# Patient Record
Sex: Female | Born: 1990 | Race: Black or African American | Hispanic: No | Marital: Single | Smoking: Current every day smoker
Health system: Southern US, Community
[De-identification: ages and names within clinical notes are randomized; demographics above are authoritative.]

## PROBLEM LIST (undated history)

## (undated) ENCOUNTER — Inpatient Hospital Stay: Payer: Self-pay

## (undated) ENCOUNTER — Inpatient Hospital Stay (HOSPITAL_COMMUNITY): Payer: Self-pay

## (undated) DIAGNOSIS — J45909 Unspecified asthma, uncomplicated: Secondary | ICD-10-CM

## (undated) DIAGNOSIS — F419 Anxiety disorder, unspecified: Secondary | ICD-10-CM

## (undated) DIAGNOSIS — F4310 Post-traumatic stress disorder, unspecified: Secondary | ICD-10-CM

## (undated) DIAGNOSIS — F329 Major depressive disorder, single episode, unspecified: Secondary | ICD-10-CM

## (undated) DIAGNOSIS — O99320 Drug use complicating pregnancy, unspecified trimester: Secondary | ICD-10-CM

## (undated) DIAGNOSIS — F32A Depression, unspecified: Secondary | ICD-10-CM

## (undated) DIAGNOSIS — N83209 Unspecified ovarian cyst, unspecified side: Secondary | ICD-10-CM

## (undated) HISTORY — PX: NO PAST SURGERIES: SHX2092

## (undated) HISTORY — PX: TUBAL LIGATION: SHX77

---
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
## 2008-02-16 ENCOUNTER — Inpatient Hospital Stay: Payer: Self-pay

## 2008-04-18 ENCOUNTER — Observation Stay: Payer: Self-pay

## 2008-05-08 ENCOUNTER — Inpatient Hospital Stay: Payer: Self-pay

## 2008-11-21 ENCOUNTER — Emergency Department: Payer: Self-pay | Admitting: Emergency Medicine

## 2009-09-19 ENCOUNTER — Emergency Department: Payer: Self-pay | Admitting: Emergency Medicine

## 2009-09-20 ENCOUNTER — Inpatient Hospital Stay: Payer: Self-pay | Admitting: Psychiatry

## 2009-11-01 ENCOUNTER — Emergency Department: Payer: Self-pay | Admitting: Unknown Physician Specialty

## 2010-08-25 ENCOUNTER — Emergency Department: Payer: Self-pay | Admitting: Emergency Medicine

## 2010-12-16 ENCOUNTER — Emergency Department: Payer: Self-pay | Admitting: Unknown Physician Specialty

## 2011-02-06 ENCOUNTER — Emergency Department: Payer: Self-pay | Admitting: Emergency Medicine

## 2011-02-26 ENCOUNTER — Observation Stay: Payer: Self-pay | Admitting: Obstetrics and Gynecology

## 2011-04-30 ENCOUNTER — Observation Stay: Payer: Self-pay | Admitting: Obstetrics and Gynecology

## 2011-05-01 ENCOUNTER — Ambulatory Visit: Payer: Self-pay | Admitting: Obstetrics and Gynecology

## 2011-05-06 ENCOUNTER — Encounter: Payer: Self-pay | Admitting: Obstetrics & Gynecology

## 2011-06-03 ENCOUNTER — Encounter: Payer: Self-pay | Admitting: Maternal & Fetal Medicine

## 2011-06-06 ENCOUNTER — Encounter: Payer: Self-pay | Admitting: Obstetrics & Gynecology

## 2011-06-10 ENCOUNTER — Encounter: Payer: Self-pay | Admitting: Maternal & Fetal Medicine

## 2011-06-13 ENCOUNTER — Encounter: Payer: Self-pay | Admitting: Obstetrics and Gynecology

## 2011-06-20 ENCOUNTER — Inpatient Hospital Stay: Payer: Self-pay | Admitting: Obstetrics and Gynecology

## 2011-06-20 ENCOUNTER — Encounter: Payer: Self-pay | Admitting: Maternal and Fetal Medicine

## 2011-06-20 LAB — CBC WITH DIFFERENTIAL/PLATELET
Basophil #: 0 10*3/uL (ref 0.0–0.1)
Basophil %: 0.2 %
Eosinophil %: 0.3 %
Lymphocyte #: 1.7 10*3/uL (ref 1.0–3.6)
Lymphocyte %: 14.4 %
MCHC: 34.2 g/dL (ref 32.0–36.0)
MCV: 96 fL (ref 80–100)
Monocyte #: 0.8 10*3/uL — ABNORMAL HIGH (ref 0.0–0.7)
Monocyte %: 6.8 %
Neutrophil #: 9.3 10*3/uL — ABNORMAL HIGH (ref 1.4–6.5)
Neutrophil %: 78.3 %
Platelet: 203 10*3/uL (ref 150–440)
RBC: 3.97 10*6/uL (ref 3.80–5.20)
RDW: 13 % (ref 11.5–14.5)
WBC: 11.8 10*3/uL — ABNORMAL HIGH (ref 3.6–11.0)

## 2011-06-23 LAB — BETA STREP CULTURE(ARMC)

## 2012-05-18 ENCOUNTER — Emergency Department: Payer: Self-pay | Admitting: Emergency Medicine

## 2012-10-10 ENCOUNTER — Emergency Department: Payer: Self-pay | Admitting: Emergency Medicine

## 2012-10-10 LAB — COMPREHENSIVE METABOLIC PANEL
Albumin: 3.5 g/dL (ref 3.4–5.0)
BUN: 12 mg/dL (ref 7–18)
Bilirubin,Total: 0.6 mg/dL (ref 0.2–1.0)
Calcium, Total: 8.8 mg/dL (ref 8.5–10.1)
Co2: 15 mmol/L — ABNORMAL LOW (ref 21–32)
EGFR (African American): >60
Potassium: 3.3 mmol/L — ABNORMAL LOW (ref 3.5–5.1)
SGOT(AST): 24 U/L (ref 15–37)
SGPT (ALT): 24 U/L (ref 12–78)
Sodium: 143 mmol/L (ref 136–145)

## 2012-10-10 LAB — CBC
HGB: 13.3 g/dL (ref 12.0–16.0)
MCH: 32.3 pg (ref 26.0–34.0)
MCV: 94 fL (ref 80–100)
Platelet: 256 10*3/uL (ref 150–440)
RDW: 12.9 % (ref 11.5–14.5)
WBC: 12.6 10*3/uL — ABNORMAL HIGH (ref 3.6–11.0)

## 2012-10-10 LAB — DRUG SCREEN, URINE
Amphetamines, Ur Screen: NEGATIVE (ref ?–1000)
Barbiturates, Ur Screen: NEGATIVE (ref ?–200)
Cocaine Metabolite,Ur ~~LOC~~: NEGATIVE (ref ?–300)
MDMA (Ecstasy)Ur Screen: NEGATIVE (ref ?–500)
Methadone, Ur Screen: NEGATIVE (ref ?–300)
Tricyclic, Ur Screen: NEGATIVE (ref ?–1000)

## 2012-10-10 LAB — ETHANOL: Ethanol %: 0.167 % — ABNORMAL HIGH (ref 0.000–0.080)

## 2013-08-19 ENCOUNTER — Emergency Department: Payer: Self-pay | Admitting: Emergency Medicine

## 2013-08-19 LAB — CBC WITH DIFFERENTIAL/PLATELET
Basophil #: 0 10*3/uL (ref 0.0–0.1)
Basophil %: 0.4 %
EOS ABS: 0.1 10*3/uL (ref 0.0–0.7)
Eosinophil %: 0.9 %
HCT: 41.2 % (ref 35.0–47.0)
HGB: 14 g/dL (ref 12.0–16.0)
Lymphocyte #: 2.8 10*3/uL (ref 1.0–3.6)
Lymphocyte %: 29.5 %
MCH: 32.5 pg (ref 26.0–34.0)
MCHC: 34 g/dL (ref 32.0–36.0)
MCV: 96 fL (ref 80–100)
MONO ABS: 0.9 x10 3/mm (ref 0.2–0.9)
MONOS PCT: 9.7 %
NEUTROS ABS: 5.6 10*3/uL (ref 1.4–6.5)
Neutrophil %: 59.5 %
Platelet: 331 10*3/uL (ref 150–440)
RBC: 4.3 10*6/uL (ref 3.80–5.20)
RDW: 13 % (ref 11.5–14.5)
WBC: 9.4 10*3/uL (ref 3.6–11.0)

## 2013-08-19 LAB — URINALYSIS, COMPLETE
BLOOD: NEGATIVE
Bilirubin,UR: NEGATIVE
Glucose,UR: NEGATIVE mg/dL (ref 0–75)
Ketone: NEGATIVE
Nitrite: NEGATIVE
PROTEIN: NEGATIVE
Ph: 6 (ref 4.5–8.0)
RBC,UR: 2 /HPF (ref 0–5)
SPECIFIC GRAVITY: 1.018 (ref 1.003–1.030)
WBC UR: 17 /HPF (ref 0–5)

## 2013-08-19 LAB — COMPREHENSIVE METABOLIC PANEL
AST: 16 U/L (ref 15–37)
Albumin: 3.5 g/dL (ref 3.4–5.0)
Alkaline Phosphatase: 85 U/L
Anion Gap: 3 — ABNORMAL LOW (ref 7–16)
BUN: 7 mg/dL (ref 7–18)
Bilirubin,Total: 1.1 mg/dL — ABNORMAL HIGH (ref 0.2–1.0)
CREATININE: 0.98 mg/dL (ref 0.60–1.30)
Calcium, Total: 9.1 mg/dL (ref 8.5–10.1)
Chloride: 107 mmol/L (ref 98–107)
Co2: 29 mmol/L (ref 21–32)
EGFR (African American): >60
EGFR (Non-African Amer.): >60
GLUCOSE: 69 mg/dL (ref 65–99)
Osmolality: 274 (ref 275–301)
Potassium: 3.7 mmol/L (ref 3.5–5.1)
SGPT (ALT): 21 U/L (ref 12–78)
SODIUM: 139 mmol/L (ref 136–145)
TOTAL PROTEIN: 7.4 g/dL (ref 6.4–8.2)

## 2013-08-19 LAB — WET PREP, GENITAL

## 2013-08-19 LAB — LIPASE, BLOOD: Lipase: 103 U/L (ref 73–393)

## 2013-08-19 LAB — GC/CHLAMYDIA PROBE AMP

## 2013-08-21 LAB — URINE CULTURE

## 2013-08-22 ENCOUNTER — Emergency Department: Payer: Self-pay | Admitting: Emergency Medicine

## 2014-05-27 NOTE — L&D Delivery Note (Signed)
Delivery Note At 2:18 AM a viable female was delivered via Vaginal Spontaneous Delivery IN OA pos, Cord around the neck x 1 reduced. Del of the vtx, ant and post shoulder and body and to mom's abd. Baby crying and moving all extrems, NICU NP and RN in room for delivery.   APGAR:9-9, ; weight: 1650 grms, 4#5oz.  .   Placenta status:delivery completed, FF and lochia.  .  Cord pH:not done  Anesthesia: none Episiotomy:  none Lacerations: none Suture Repair: none Est. Blood Loss (mL):    Mom stable. BP sl elevated. Pecola Leisure.  Baby to Couplet care / Skin to Skin.  Sharee Pimplearon W Jones 03/24/2015, 2:46 AM

## 2014-08-31 ENCOUNTER — Encounter (HOSPITAL_COMMUNITY): Payer: Self-pay | Admitting: *Deleted

## 2014-08-31 ENCOUNTER — Emergency Department (HOSPITAL_COMMUNITY)
Admission: EM | Admit: 2014-08-31 | Discharge: 2014-08-31 | Disposition: A | Discharge status: Home / Self Care | Payer: Medicaid Other | Attending: Emergency Medicine | Admitting: Emergency Medicine

## 2014-08-31 DIAGNOSIS — O99611 Diseases of the digestive system complicating pregnancy, first trimester: Secondary | ICD-10-CM | POA: Diagnosis not present

## 2014-08-31 DIAGNOSIS — K219 Gastro-esophageal reflux disease without esophagitis: Secondary | ICD-10-CM | POA: Insufficient documentation

## 2014-08-31 DIAGNOSIS — Z3A01 Less than 8 weeks gestation of pregnancy: Secondary | ICD-10-CM | POA: Insufficient documentation

## 2014-08-31 DIAGNOSIS — R110 Nausea: Secondary | ICD-10-CM

## 2014-08-31 DIAGNOSIS — F17210 Nicotine dependence, cigarettes, uncomplicated: Secondary | ICD-10-CM | POA: Insufficient documentation

## 2014-08-31 DIAGNOSIS — O2341 Unspecified infection of urinary tract in pregnancy, first trimester: Secondary | ICD-10-CM | POA: Diagnosis not present

## 2014-08-31 DIAGNOSIS — O99331 Smoking (tobacco) complicating pregnancy, first trimester: Secondary | ICD-10-CM | POA: Diagnosis not present

## 2014-08-31 DIAGNOSIS — O9989 Other specified diseases and conditions complicating pregnancy, childbirth and the puerperium: Secondary | ICD-10-CM | POA: Diagnosis present

## 2014-08-31 DIAGNOSIS — Z3490 Encounter for supervision of normal pregnancy, unspecified, unspecified trimester: Secondary | ICD-10-CM

## 2014-08-31 LAB — URINALYSIS, ROUTINE W REFLEX MICROSCOPIC
BILIRUBIN URINE: NEGATIVE
Glucose, UA: NEGATIVE mg/dL
Ketones, ur: NEGATIVE mg/dL
NITRITE: NEGATIVE
PROTEIN: NEGATIVE mg/dL
SPECIFIC GRAVITY, URINE: 1.014 (ref 1.005–1.030)
Urobilinogen, UA: 0.2 mg/dL (ref 0.0–1.0)
pH: 6 (ref 5.0–8.0)

## 2014-08-31 LAB — URINE MICROSCOPIC-ADD ON

## 2014-08-31 LAB — POC URINE PREG, ED: PREG TEST UR: POSITIVE — AB

## 2014-08-31 MED ORDER — CEPHALEXIN 500 MG PO CAPS: ORAL_CAPSULE | ORAL | Status: DC

## 2014-08-31 MED ORDER — DOXYLAMINE SUCCINATE (SLEEP) 25 MG PO TABS: 25.0000 mg | ORAL_TABLET | Freq: Every evening | ORAL | Status: DC | PRN

## 2014-08-31 MED ORDER — VITAMIN B-6 25 MG PO TABS: 25.0000 mg | ORAL_TABLET | Freq: Every evening | ORAL | Status: DC | PRN

## 2014-08-31 NOTE — Discharge Instructions (Signed)
Take vitamin B6 with unisom as directed as needed for nausea. Avoid spicy, fatty, or fried foods. Raise the head of your bed to help with your indigestion. Stay very well hydrated with plenty of water throughout the day. Take antibiotic until completed. Follow up with your OBGYN in 1 week for recheck of ongoing symptoms but return to ER for emergent changing or worsening of symptoms. Please seek immediate care if you develop the following: You develop back pain.  Your symptoms are no better, or worse in 3 days. There is severe back pain or lower abdominal pain.  You develop chills.  You have a fever.  There is nausea or vomiting.  There is continued burning or discomfort with urination.     Asymptomatic Bacteriuria Asymptomatic bacteriuria is the presence of a large number of bacteria in your urine without the usual symptoms of burning or frequent urination. The following conditions increase the risk of asymptomatic bacteriuria:  Diabetes mellitus.  Advanced age.  Pregnancy in the first trimester.  Kidney stones.  Kidney transplants.  Leaky kidney tube valve in young children (reflux). Treatment for this condition is not needed in most people and can lead to other problems such as too much yeast and growth of resistant bacteria. However, some people, such as pregnant women, do need treatment to prevent kidney infection. Asymptomatic bacteriuria in pregnancy is also associated with fetal growth restriction, premature labor, and newborn death. HOME CARE INSTRUCTIONS Monitor your condition for any changes. The following actions may help to relieve any discomfort you are feeling:  Drink enough water and fluids to keep your urine clear or pale yellow. Go to the bathroom more often to keep your bladder empty.  Keep the area around your vagina and rectum clean. Wipe yourself from front to back after urinating. SEEK IMMEDIATE MEDICAL CARE IF:  You develop signs of an infection such  as:  Burning with urination.  Frequency of voiding.  Back pain.  Fever.  You have blood in the urine.  You develop a fever. MAKE SURE YOU:  Understand these instructions.  Will watch your condition.  Will get help right away if you are not doing well or get worse. Document Released: 05/13/2005 Document Revised: 09/27/2013 Document Reviewed: 11/02/2012 [facility] Patient Information 2015 [place], [place]. This information is not intended to replace advice given to you by your health care provider. Make sure you discuss any questions you have with your health care provider.  First Trimester of Pregnancy The first trimester of pregnancy is from week 1 until the end of week 12 (months 1 through 3). During this time, your baby will begin to develop inside you. At 6-8 weeks, the eyes and face are formed, and the heartbeat can be seen on ultrasound. At the end of 12 weeks, all the baby's organs are formed. Prenatal care is all the medical care you receive before the birth of your baby. Make sure you get good prenatal care and follow all of your doctor's instructions. HOME CARE  Medicines  Take medicine only as told by your doctor. Some medicines are safe and some are not during pregnancy.  Take your prenatal vitamins as told by your doctor.  Take medicine that helps you poop (stool softener) as needed if your doctor says it is okay. Diet  Eat regular, healthy meals.  Your doctor will tell you the amount of weight gain that is right for you.  Avoid raw meat and uncooked cheese.  If you feel sick to your  stomach (nauseous) or throw up (vomit):  Eat 4 or 5 small meals a day instead of 3 large meals.  Try eating a few soda crackers.  Drink liquids between meals instead of during meals.  If you have a hard time pooping (constipation):  Eat high-fiber foods like fresh vegetables, fruit, and whole grains.  Drink enough fluids to keep your pee (urine) clear or pale  yellow. Activity and Exercise  Exercise only as told by your doctor. Stop exercising if you have cramps or pain in your lower belly (abdomen) or low back.  Try to avoid standing for long periods of time. Move your legs often if you must stand in one place for a long time.  Avoid heavy lifting.  Wear low-heeled shoes. Sit and stand up straight.  You can have sex unless your doctor tells you not to. Relief of Pain or Discomfort  Wear a good support bra if your breasts are sore.  Take warm water baths (sitz baths) to soothe pain or discomfort caused by hemorrhoids. Use hemorrhoid cream if your doctor says it is okay.  Rest with your legs raised if you have leg cramps or low back pain.  Wear support hose if you have puffy, bulging veins (varicose veins) in your legs. Raise (elevate) your feet for 15 minutes, 3-4 times a day. Limit salt in your diet. Prenatal Care  Schedule your prenatal visits by the twelfth week of pregnancy.  Write down your questions. Take them to your prenatal visits.  Keep all your prenatal visits as told by your doctor. Safety  Wear your seat belt at all times when driving.  Make a list of emergency phone numbers. The list should include numbers for family, friends, the hospital, and police and fire departments. General Tips  Ask your doctor for a referral to a local prenatal class. Begin classes no later than at the start of month 6 of your pregnancy.  Ask for help if you need counseling or help with nutrition. Your doctor can give you advice or tell you where to go for help.  Do not use hot tubs, steam rooms, or saunas.  Do not douche or use tampons or scented sanitary pads.  Do not cross your legs for long periods of time.  Avoid litter boxes and soil used by cats.  Avoid all smoking, herbs, and alcohol. Avoid drugs not approved by your doctor.  Visit your dentist. At home, brush your teeth with a soft toothbrush. Be gentle when you floss. GET  HELP IF:  You are dizzy.  You have mild cramps or pressure in your lower belly.  You have a nagging pain in your belly area.  You continue to feel sick to your stomach, throw up, or have watery poop (diarrhea).  You have a bad smelling fluid coming from your vagina.  You have pain with peeing (urination).  You have increased puffiness (swelling) in your face, hands, legs, or ankles. GET HELP RIGHT AWAY IF:   You have a fever.  You are leaking fluid from your vagina.  You have spotting or bleeding from your vagina.  You have very bad belly cramping or pain.  You gain or lose weight rapidly.  You throw up blood. It may look like coffee grounds.  You are around people who have [place] measles, fifth disease, or chickenpox.  You have a very bad headache.  You have shortness of breath.  You have any kind of trauma, such as from a fall or  a car accident. Document Released: 10/30/2007 Document Revised: 09/27/2013 Document Reviewed: 03/23/2013 [facility] Patient Information 2015 [place], [place]. This information is not intended to replace advice given to you by your health care provider. Make sure you discuss any questions you have with your health care provider.  Nausea, Adult Nausea is the feeling that you have an upset stomach or have to vomit. Nausea by itself is not likely a serious concern, but it may be an early sign of more serious medical problems. As nausea gets worse, it can lead to vomiting. If vomiting develops, there is the risk of dehydration.  CAUSES   Viral infections.  Food poisoning.  Medicines.  Pregnancy.  Motion sickness.  Migraine headaches.  Emotional distress.  Severe pain from any source.  Alcohol intoxication. HOME CARE INSTRUCTIONS  Get plenty of rest.  Ask your caregiver about specific rehydration instructions.  Eat small amounts of food and sip liquids more often.  Take all medicines as told by your caregiver. SEEK MEDICAL CARE  IF:  You have not improved after 2 days, or you get worse.  You have a headache. SEEK IMMEDIATE MEDICAL CARE IF:   You have a fever.  You faint.  You keep vomiting or have blood in your vomit.  You are extremely weak or dehydrated.  You have dark or bloody stools.  You have severe chest or abdominal pain. MAKE SURE YOU:  Understand these instructions.  Will watch your condition.  Will get help right away if you are not doing well or get worse. Document Released: 06/20/2004 Document Revised: 02/05/2012 Document Reviewed: 01/23/2011 [facility] Patient Information 2015 [place], [place]. This information is not intended to replace advice given to you by your health care provider. Make sure you discuss any questions you have with your health care provider.  Food Choices for Gastroesophageal Reflux Disease When you have gastroesophageal reflux disease (GERD), the foods you eat and your eating habits are very important. Choosing the right foods can help ease your discomfort.  WHAT GUIDELINES DO I NEED TO FOLLOW?   Choose fruits, vegetables, whole grains, and low-fat dairy products.   Choose low-fat meat, fish, and poultry.  Limit fats such as oils, salad dressings, butter, nuts, and avocado.   Keep a food diary. This helps you identify foods that cause symptoms.   Avoid foods that cause symptoms. These may be different for everyone.   Eat small meals often instead of 3 large meals a day.   Eat your meals slowly, in a place where you are relaxed.   Limit fried foods.   Cook foods using methods other than frying.   Avoid drinking alcohol.   Avoid drinking large amounts of liquids with your meals.   Avoid bending over or lying down until 2-3 hours after eating.  WHAT FOODS ARE NOT RECOMMENDED?  These are some foods and drinks that may make your symptoms worse: Vegetables Tomatoes. Tomato juice. Tomato and spaghetti sauce. Chili peppers. Onion and garlic.  Horseradish. Fruits Oranges, grapefruit, and lemon (fruit and juice). Meats High-fat meats, fish, and poultry. This includes hot dogs, ribs, ham, sausage, salami, and bacon. Dairy Whole milk and chocolate milk. Sour cream. Cream. Butter. Ice cream. Cream cheese.  Drinks Coffee and tea. Bubbly (carbonated) drinks or energy drinks. Condiments Hot sauce. Barbecue sauce.  Sweets/Desserts Chocolate and cocoa. Donuts. Peppermint and spearmint. Fats and Oils High-fat foods. This includes [place] fries and potato chips. Other Vinegar. Strong spices. This includes black pepper, white pepper, red pepper, cayenne,  curry powder, cloves, ginger, and chili powder. The items listed above may not be a complete list of foods and drinks to avoid. Contact your dietitian for more information. Document Released: 11/12/2011 Document Revised: 05/18/2013 Document Reviewed: 03/17/2013 [facility] Patient Information 2015 [place], [place]. This information is not intended to replace advice given to you by your health care provider. Make sure you discuss any questions you have with your health care provider.

## 2014-08-31 NOTE — ED Notes (Signed)
Pt comfortable with discharge and follow up instructions. Pt declines wheelchair, escorted to waiting area by this RN. Prescriptions x3. 

## 2014-08-31 NOTE — ED Notes (Addendum)
Pt reports feeling nauseated and like "something is in her throat" for several days, denies vomiting. lmp was feb, reports + preg test two days ago.

## 2014-08-31 NOTE — ED Notes (Signed)
PA at bedside.

## 2014-08-31 NOTE — ED Provider Notes (Signed)
CSN: [id number]     Arrival date & time 08/31/14  0906 History   First MD Initiated Contact with Patient 08/31/14 0913     Chief Complaint  Patient presents with  . Nausea     (Consider location/radiation/quality/duration/timing/severity/associated sxs/prior Treatment) HPI Comments: ADESSA PRIMIANO is a 24 y.o. G3P2 female who presents to the ED with complaints of nausea, heartburn, and globus sensation 1 week. Patient reports that she took a home pregnancy test 2 days ago which was positive, but states that it was from [employer] store and she is unsure if it would be accurate. She reports her symptoms have no aggravating factors, and have been unrelieved with Pepto-Bismol. She denies any fevers, chills, chest pain, shortness breath, abdominal pain, vomiting, diarrhea, constipation, obstipation, vaginal bleeding or discharge, dysuria, hematuria, numbness, tingling, weakness, rashes, recent travel, sick contacts, suspicious food intake, alcohol use, or NSAIDs. LMP 07/17/14. She has had uncomplicated pregnancies for her previous 2 children.  Patient is a 24 y.o. female presenting with pregnancy problem. The history is provided by the patient. No language interpreter was used.  Possible Pregnancy This is a new problem. The current episode started today. The problem occurs rarely. The problem has been unchanged. Associated symptoms include nausea. Pertinent negatives include no abdominal pain, arthralgias, chest pain, chills, fever, myalgias, numbness, rash, vomiting or weakness. Nothing aggravates the symptoms. Treatments tried: pepto bismol. The treatment provided no relief.    History reviewed. No pertinent past medical history. History reviewed. No pertinent past surgical history. History reviewed. No pertinent family history. History  Substance Use Topics  . Smoking status: Current Every Day Smoker    Types: Cigarettes  . Smokeless tobacco: Not on file  . Alcohol Use: No   OB History     No data available     Review of Systems  Constitutional: Negative for fever and chills.  Respiratory: Negative for shortness of breath.   Cardiovascular: Negative for chest pain.  Gastrointestinal: Positive for nausea. Negative for vomiting, abdominal pain, diarrhea and constipation.  Genitourinary: Negative for dysuria, hematuria, vaginal bleeding, vaginal discharge and pelvic pain.  Musculoskeletal: Negative for myalgias and arthralgias.  Skin: Negative for rash.  Neurological: Negative for weakness and numbness.  Psychiatric/Behavioral: Negative for confusion.   10 Systems reviewed and are negative for acute change except as noted in the HPI.    Allergies  Shellfish allergy  Home Medications   Prior to Admission medications   Not on File   BP 106/50 mmHg  Pulse 101  Temp(Src) 97.8 F (36.6 C) (Oral)  Resp 18  SpO2 99%  LMP 07/17/2014 Physical Exam  Constitutional: She is oriented to person, place, and time. Vital signs are normal. She appears well-developed and well-nourished.  Non-toxic appearance. No distress.  Afebrile, nontoxic, NAD  HENT:  Head: Normocephalic and atraumatic.  Mouth/Throat: Oropharynx is clear and moist and mucous membranes are normal.  Eyes: Conjunctivae and EOM are normal. Right eye exhibits no discharge. Left eye exhibits no discharge.  Neck: Normal range of motion. Neck supple.  Cardiovascular: Normal rate, regular rhythm, normal heart sounds and intact distal pulses.  Exam reveals no gallop and no friction rub.   No murmur heard. Triage vitals showing HR 101, during exam HR 80-90s  Pulmonary/Chest: Effort normal and breath sounds normal. No respiratory distress. She has no decreased breath sounds. She has no wheezes. She has no rhonchi. She has no rales.  Abdominal: Soft. Normal appearance and bowel sounds are normal. She exhibits  no distension. There is no tenderness. There is no rigidity, no rebound, no guarding, no CVA tenderness, no  tenderness at McBurney's point and negative Murphy's sign.  Soft, NTND, +BS throughout, no r/g/r, neg murphy's, neg mcburney's, no CVA TTP   Musculoskeletal: Normal range of motion.  Neurological: She is alert and oriented to person, place, and time. She has normal strength. No sensory deficit.  Skin: Skin is warm, dry and intact. No rash noted.  Psychiatric: She has a normal mood and affect.  Nursing note and vitals reviewed.   ED Course  Procedures (including critical care time) Labs Review Labs Reviewed  URINALYSIS, ROUTINE W REFLEX MICROSCOPIC - Abnormal; Notable for the following:    APPearance TURBID (*)    Hgb urine dipstick TRACE (*)    Leukocytes, UA LARGE (*)    All other components within normal limits  URINE MICROSCOPIC-ADD ON - Abnormal; Notable for the following:    Squamous Epithelial / LPF FEW (*)    All other components within normal limits  POC URINE PREG, ED - Abnormal; Notable for the following:    Preg Test, Ur POSITIVE (*)    All other components within normal limits  URINE CULTURE    Imaging Review No results found.   EKG Interpretation None      MDM   Final diagnoses:  Nausea  Pregnancy  UTI (urinary tract infection) during pregnancy, first trimester  Gastroesophageal reflux disease, esophagitis presence not specified    24 y.o. female here with nausea and globus sensation in throat, and heartburn. Upreg + at home 2 days ago. No abdominal pain or tenderness, no vaginal complaints. History c/w nausea of early pregnancy and GERD. Pt would like to verify that she's pregnant, will get upreg. Will also check U/A for signs of infection in the event that she is pregnant. Discussed unisom+B6 combo for nausea, and avoidance of spicy/acidic foods as well as raising head of bed.   11:38 AM U/A with some signs of infection, given her pregnancy (Upreg positive here), will treat, will send for culture. Will give keflex for this. Will have her f/up with OBGYN.  I explained the diagnosis and have given explicit precautions to return to the ER including for any other new or worsening symptoms. The patient understands and accepts the medical plan as it's been dictated and I have answered their questions. Discharge instructions concerning home care and prescriptions have been given. The patient is STABLE and is discharged to home in good condition.   BP 118/61 mmHg  Pulse 53  Temp(Src) 97.8 F (36.6 C) (Oral)  Resp 18  Ht 5\' 4"  (1.626 m)  Wt 117 lb (53.071 kg)  BMI 20.07 kg/m2  SpO2 100%  LMP 07/17/2014  Meds ordered this encounter  Medications  . vitamin B-6 (PYRIDOXINE) 25 MG tablet    Sig: Take 1 tablet (25 mg total) by mouth at bedtime as needed (nausea).    Dispense:  30 tablet    Refill:  0    Order Specific Question:  Supervising Provider    Answer:  MILLER, BRIAN [[id number]]  . doxylamine, Sleep, (UNISOM) 25 MG tablet    Sig: Take 1 tablet (25 mg total) by mouth at bedtime as needed (nausea).    Dispense:  30 tablet    Refill:  0    Order Specific Question:  Supervising Provider    Answer:  MILLER, BRIAN [[id number]]  . cephALEXin (KEFLEX) 500 MG capsule    Sig: 2 caps  po bid x 7 days    Dispense:  28 capsule    Refill:  0    Order Specific Question:  Supervising Provider    Answer:  Eber Hong [[id number]]     Calysta Craigo Camprubi-Soms, PA-C 08/31/14 1139  Doug Sou, MD 08/31/14 1725

## 2014-09-01 LAB — URINE CULTURE: SPECIAL REQUESTS: NORMAL

## 2014-10-18 ENCOUNTER — Other Ambulatory Visit: Payer: Self-pay | Admitting: Family Medicine

## 2014-10-18 DIAGNOSIS — O0992 Supervision of high risk pregnancy, unspecified, second trimester: Secondary | ICD-10-CM

## 2014-10-20 ENCOUNTER — Other Ambulatory Visit: Payer: Self-pay | Admitting: Family Medicine

## 2014-10-20 ENCOUNTER — Ambulatory Visit
Admission: RE | Admit: 2014-10-20 | Discharge: 2014-10-20 | Disposition: A | Discharge status: Home / Self Care | Payer: Medicaid Other | Source: Ambulatory Visit | Attending: Family Medicine | Admitting: Family Medicine

## 2014-10-20 ENCOUNTER — Ambulatory Visit: Admission: RE | Admit: 2014-10-20 | Payer: Medicaid Other | Source: Ambulatory Visit

## 2014-10-20 DIAGNOSIS — Z36 Encounter for antenatal screening of mother: Secondary | ICD-10-CM | POA: Diagnosis not present

## 2014-10-20 DIAGNOSIS — O26841 Uterine size-date discrepancy, first trimester: Secondary | ICD-10-CM

## 2014-10-20 DIAGNOSIS — Z3A12 12 weeks gestation of pregnancy: Secondary | ICD-10-CM | POA: Diagnosis not present

## 2014-10-20 DIAGNOSIS — O26849 Uterine size-date discrepancy, unspecified trimester: Secondary | ICD-10-CM

## 2014-10-20 DIAGNOSIS — Z3491 Encounter for supervision of normal pregnancy, unspecified, first trimester: Secondary | ICD-10-CM

## 2014-10-20 DIAGNOSIS — O0992 Supervision of high risk pregnancy, unspecified, second trimester: Secondary | ICD-10-CM

## 2014-11-16 ENCOUNTER — Emergency Department
Admission: EM | Admit: 2014-11-16 | Discharge: 2014-11-18 | Disposition: A | Discharge status: Home / Self Care | Payer: Medicaid Other | Attending: Emergency Medicine | Admitting: Emergency Medicine

## 2014-11-16 ENCOUNTER — Encounter: Payer: Self-pay | Admitting: Emergency Medicine

## 2014-11-16 DIAGNOSIS — Z3490 Encounter for supervision of normal pregnancy, unspecified, unspecified trimester: Secondary | ICD-10-CM

## 2014-11-16 DIAGNOSIS — F17210 Nicotine dependence, cigarettes, uncomplicated: Secondary | ICD-10-CM | POA: Insufficient documentation

## 2014-11-16 DIAGNOSIS — F32A Depression, unspecified: Secondary | ICD-10-CM

## 2014-11-16 DIAGNOSIS — N390 Urinary tract infection, site not specified: Secondary | ICD-10-CM

## 2014-11-16 DIAGNOSIS — O99332 Smoking (tobacco) complicating pregnancy, second trimester: Secondary | ICD-10-CM | POA: Diagnosis not present

## 2014-11-16 DIAGNOSIS — F1210 Cannabis abuse, uncomplicated: Secondary | ICD-10-CM | POA: Diagnosis not present

## 2014-11-16 DIAGNOSIS — O99322 Drug use complicating pregnancy, second trimester: Secondary | ICD-10-CM | POA: Diagnosis not present

## 2014-11-16 DIAGNOSIS — F329 Major depressive disorder, single episode, unspecified: Secondary | ICD-10-CM | POA: Insufficient documentation

## 2014-11-16 DIAGNOSIS — Z3A16 16 weeks gestation of pregnancy: Secondary | ICD-10-CM | POA: Insufficient documentation

## 2014-11-16 DIAGNOSIS — Z79899 Other long term (current) drug therapy: Secondary | ICD-10-CM | POA: Diagnosis not present

## 2014-11-16 DIAGNOSIS — O99342 Other mental disorders complicating pregnancy, second trimester: Secondary | ICD-10-CM | POA: Diagnosis not present

## 2014-11-16 DIAGNOSIS — F4323 Adjustment disorder with mixed anxiety and depressed mood: Secondary | ICD-10-CM

## 2014-11-16 HISTORY — DX: Unspecified asthma, uncomplicated: J45.909

## 2014-11-16 LAB — ACETAMINOPHEN LEVEL

## 2014-11-16 LAB — COMPREHENSIVE METABOLIC PANEL
ALT: 13 U/L — ABNORMAL LOW (ref 14–54)
ANION GAP: 8 (ref 5–15)
AST: 18 U/L (ref 15–41)
Albumin: 3.6 g/dL (ref 3.5–5.0)
Alkaline Phosphatase: 73 U/L (ref 38–126)
BUN: 10 mg/dL (ref 6–20)
CALCIUM: 9.2 mg/dL (ref 8.9–10.3)
CO2: 23 mmol/L (ref 22–32)
CREATININE: 0.69 mg/dL (ref 0.44–1.00)
Chloride: 104 mmol/L (ref 101–111)
GFR calc Af Amer: >60 mL/min (ref >60)
GFR calc non Af Amer: >60 mL/min (ref >60)
Glucose, Bld: 90 mg/dL (ref 65–99)
Potassium: 3.7 mmol/L (ref 3.5–5.1)
Sodium: 135 mmol/L (ref 135–145)
TOTAL PROTEIN: 7.7 g/dL (ref 6.5–8.1)
Total Bilirubin: 0.7 mg/dL (ref 0.3–1.2)

## 2014-11-16 LAB — ETHANOL: Alcohol, Ethyl (B): <5 mg/dL (ref <5)

## 2014-11-16 LAB — URINE DRUG SCREEN, QUALITATIVE (ARMC ONLY)
Amphetamines, Ur Screen: NOT DETECTED
Barbiturates, Ur Screen: NOT DETECTED
Benzodiazepine, Ur Scrn: NOT DETECTED
CANNABINOID 50 NG, UR ~~LOC~~: POSITIVE — AB
COCAINE METABOLITE, UR ~~LOC~~: NOT DETECTED
MDMA (Ecstasy)Ur Screen: NOT DETECTED
Methadone Scn, Ur: NOT DETECTED
Opiate, Ur Screen: NOT DETECTED
Phencyclidine (PCP) Ur S: NOT DETECTED
TRICYCLIC, UR SCREEN: NOT DETECTED

## 2014-11-16 LAB — URINALYSIS COMPLETE WITH MICROSCOPIC (ARMC ONLY)
Bilirubin Urine: NEGATIVE
Glucose, UA: NEGATIVE mg/dL
Nitrite: NEGATIVE
Protein, ur: 30 mg/dL — AB
SPECIFIC GRAVITY, URINE: 1.03 (ref 1.005–1.030)
pH: 5 (ref 5.0–8.0)

## 2014-11-16 LAB — CBC
HEMATOCRIT: 41.6 % (ref 35.0–47.0)
HEMOGLOBIN: 13.8 g/dL (ref 12.0–16.0)
MCH: 32.1 pg (ref 26.0–34.0)
MCHC: 33.1 g/dL (ref 32.0–36.0)
MCV: 96.8 fL (ref 80.0–100.0)
Platelets: 263 10*3/uL (ref 150–440)
RBC: 4.3 MIL/uL (ref 3.80–5.20)
RDW: 13 % (ref 11.5–14.5)
WBC: 18.7 10*3/uL — AB (ref 3.6–11.0)

## 2014-11-16 LAB — PREGNANCY, URINE: Preg Test, Ur: POSITIVE — AB

## 2014-11-16 LAB — SALICYLATE LEVEL: Salicylate Lvl: <4 mg/dL (ref 2.8–30.0)

## 2014-11-16 MED ORDER — METOCLOPRAMIDE HCL 10 MG PO TABS
ORAL_TABLET | ORAL | Status: AC
  Administered 2014-11-16: 10 mg via ORAL
  Filled 2014-11-16: qty 1

## 2014-11-16 MED ORDER — CEPHALEXIN 500 MG PO CAPS
ORAL_CAPSULE | ORAL | Status: AC
  Administered 2014-11-16: 500 mg via ORAL
  Filled 2014-11-16: qty 1

## 2014-11-16 MED ORDER — PRENATAL MULTIVITAMIN CH
1.0000 | ORAL_TABLET | Freq: Every day | ORAL | Status: DC
  Administered 2014-11-16 – 2014-11-17 (×2): 1 via ORAL
  Filled 2014-11-16 (×3): qty 1

## 2014-11-16 MED ORDER — METOCLOPRAMIDE HCL 10 MG PO TABS
10.0000 mg | ORAL_TABLET | Freq: Once | ORAL | Status: AC
  Administered 2014-11-16: 10 mg via ORAL

## 2014-11-16 MED ORDER — METRONIDAZOLE 500 MG PO TABS
ORAL_TABLET | ORAL | Status: AC
  Administered 2014-11-16: 500 mg via ORAL
  Filled 2014-11-16: qty 1

## 2014-11-16 MED ORDER — CEPHALEXIN 500 MG PO CAPS
500.0000 mg | ORAL_CAPSULE | Freq: Three times a day (TID) | ORAL | Status: DC
  Administered 2014-11-16 – 2014-11-18 (×6): 500 mg via ORAL
  Filled 2014-11-16: qty 1

## 2014-11-16 MED ORDER — METRONIDAZOLE 500 MG PO TABS
500.0000 mg | ORAL_TABLET | Freq: Two times a day (BID) | ORAL | Status: DC
  Administered 2014-11-16 – 2014-11-18 (×4): 500 mg via ORAL
  Filled 2014-11-16 (×4): qty 1

## 2014-11-16 NOTE — ED Notes (Signed)
Pt report received from Megan RN. Pt care assumed. Pt asleep with no needs or concerns at this time.  

## 2014-11-16 NOTE — ED Notes (Signed)

## 2014-11-16 NOTE — ED Notes (Signed)
BEHAVIORAL HEALTH ROUNDING Patient sleeping: No. Patient alert and oriented: yes Behavior appropriate: Yes.  ; If no, describe:  Nutrition and fluids offered: Yes Toileting and hygiene offered: Yes  Sitter present: yes Law enforcement present: Yes ODS  

## 2014-11-16 NOTE — ED Notes (Signed)
BEHAVIORAL HEALTH ROUNDING Patient sleeping: No. Patient alert and oriented: yes Behavior appropriate: Yes.  ; If no, describe:  Nutrition and fluids offered: Yes  Toileting and hygiene offered: Yes  Sitter present: yes Law enforcement present: Yes  

## 2014-11-16 NOTE — BH Assessment (Addendum)
Assessment Note  Casey Owens is an 24 y.o. female. Patient presented to the ED via [facility] after reports of wanting to harm herself.  Patient reports she is [redacted] weeks pregnant and just lost her job and housing.  Patient reports she has no support system other than her mother, however, they do not get along.  Patient reports she has two other children and receives no assistance from any of the fathers.  Patient is tearful but denies plans to hurt herself.  Patient reports a history of cutting.  Axis I: Depressive Disorder NOS  Axis III:  Pregnancy Axis IV: Lacks housing, financial, employment  Past Medical History:  Past Medical History  Diagnosis Date  . Asthma     History reviewed. No pertinent past surgical history.  Family History: No family history on file.  Social History:  reports that she has been smoking Cigarettes.  She does not have any smokeless tobacco history on file. She reports that she does not drink alcohol or use illicit drugs.  Additional Social History:     CIWA: CIWA-Ar BP: 109/64 mmHg Pulse Rate: (!) 57 COWS:    Allergies:  Allergies  Allergen Reactions  . Shellfish Allergy     Home Medications:  (Not in a hospital admission)  OB/GYN Status:  Patient's last menstrual period was 07/17/2014.  General Assessment Data [facility] of [facility] TTS Assessment: In system Is this a Tele or Face-to-Face Assessment?: Face-to-Face Is this an Initial Assessment or a Re-assessment for this encounter?: Initial Assessment Marital status: Single Is patient pregnant?: Yes Pregnancy Status: Yes (Comment: include estimated delivery date) Living Arrangements: Other (Comment) (Patient reports she lost her housing.) Can pt return to current living arrangement?: No Admission Status: Voluntary Is patient capable of signing voluntary admission?: Yes Referral Source: Other (Health Department) Insurance type: Medicaid  Medical Screening Exam [facility]  Walk-in ONLY) Medical Exam completed: Yes  Crisis Care Plan Living Arrangements: Other (Comment) (Patient reports she lost her housing.)  Education Status Is patient currently in school?: Yes Current Grade: college Highest grade of school patient has completed: 12th  Risk to self with the past 6 months Suicidal Ideation: Yes-Currently Present Has patient been a risk to self within the past 6 months prior to admission? : Yes Suicidal Intent: No Has patient had any suicidal intent within the past 6 months prior to admission? : No Is patient at risk for suicide?: No Suicidal Plan?: No Has patient had any suicidal plan within the past 6 months prior to admission? : No Access to Means: No What has been your use of drugs/alcohol within the last 12 months?: None reported Previous Attempts/Gestures: No Other Self Harm Risks: Hx of cutting Triggers for Past Attempts: Other (Comment) (Conflict with others) Intentional Self Injurious Behavior: Cutting Comment - Self Injurious Behavior: Hx of cutting arm Family Suicide History: No Recent stressful life event(s): Job Loss, Financial Problems, Conflict (Comment), Other (Comment) (Lost housing) Persecutory voices/beliefs?: No Depression: Yes Depression Symptoms: Despondent, Tearfulness, Feeling worthless/self pity, Fatigue (Hopeless) Substance abuse history and/or treatment for substance abuse?: No Suicide prevention information given to non-admitted patients: Not applicable  Risk to Others within the past 6 months Homicidal Ideation: No Does patient have any lifetime risk of violence toward others beyond the six months prior to admission? : No Thoughts of Harm to Others: No Current Homicidal Intent: No Current Homicidal Plan: No Access to Homicidal Means: No Identified Victim: N/A History of harm to others?: No Assessment of Violence:  None Noted Violent Behavior Description: N/A Does patient have access to weapons?: No Criminal  Charges Pending?: No Does patient have a court date: No Is patient on probation?: No  Psychosis Hallucinations: None noted Delusions: None noted  Mental Status Report Appearance/Hygiene: In hospital gown Eye Contact: Good Motor Activity: Unable to assess Speech: Soft Level of Consciousness: Alert Mood: Depressed, Sad, Helpless, Despair, Anxious Affect: Anxious, Appropriate to circumstance, Depressed, Sad Anxiety Level: Moderate Thought Processes: Flight of Ideas Judgement: Partial Orientation: Person, Place, Time, Situation Obsessive Compulsive Thoughts/Behaviors: Minimal  Cognitive Functioning Concentration: Normal Memory: Recent Intact IQ: Average Insight: Fair (Does not consider consequences for poor choices) Impulse Control: Good Appetite: Fair  ADLScreening [facility]) Patient's cognitive ability adequate to safely complete daily activities?: Yes Patient able to express need for assistance with ADLs?: Yes Independently performs ADLs?: Yes (appropriate for developmental age)  Prior Inpatient Therapy Prior Inpatient Therapy: No Reason for Treatment: Symptoms of depression  Prior Outpatient Therapy Prior Outpatient Therapy: No Does patient have an ACCT team?: No Does patient have Intensive In-House Services?  : No Does patient have [facility] services? : No Does patient have P4CC services?: No  ADL Screening (condition at time of admission) Patient's cognitive ability adequate to safely complete daily activities?: Yes Patient able to express need for assistance with ADLs?: Yes Independently performs ADLs?: Yes (appropriate for developmental age)                  Additional Information 1:1 In Past 12 Months?: No CIRT Risk: No Elopement Risk: No     Disposition:  Disposition Initial Assessment Completed for this Encounter: Yes Disposition of Patient: Other dispositions (Psych MD to see) Other disposition(s): Other (Comment) (Psych MD  to see)  On Site Evaluation by:   Reviewed with Physician:    [place] 11/16/2014 6:08 PM

## 2014-11-16 NOTE — ED Provider Notes (Signed)
[facility] Emergency Department Provider Note  ____________________________________________  Time seen: Approximately 345 PM  I have reviewed the triage vital signs and the nursing notes.   HISTORY  Chief Complaint Suicidal    HPI [name] is a 24 y.o. female who presents today with depression 6 months. She says that for the past 6 months she has been homeless and has been living in various places including shelter and in a tent behind her boyfriend's mother's home. She says that she has had intermittent thoughts of hurting herself but has no specific plan. She has 2 children at home and is [redacted] weeks pregnant. Her 2 children are with her mother at this time and she says that she feels safe with them there. She denies any dysuria. However, she was recently treated for Chlamydia and gonorrhea. She was also given a prescription for medication for Trichomonas but cannot afford it. She has never had a sexually-transmitted disease in the past. She has not been sexual active since being treated on June 13 for the chlamydia and gonorrhea. She denies any lower quadrant abdominal pain at this time. Denies any vaginal discharge or bleeding as well this time. No auditory or visual hallucinations. Getting her [facility] care [facility] the health department. Says is supposed to be taking her prenatal vitamins but only taking them intermittently.    Past Medical History  Diagnosis Date  . Asthma     There are no active problems to display for this patient.   History reviewed. No pertinent past surgical history.  Current Outpatient Rx  Name  Route  Sig  Dispense  Refill  . albuterol (PROVENTIL HFA;VENTOLIN HFA) 108 (90 BASE) MCG/ACT inhaler   Inhalation   Inhale 1-2 puffs into the lungs every 6 (six) hours as needed for wheezing or shortness of breath.         . budesonide (PULMICORT) 0.5 MG/2ML nebulizer solution   Nebulization   Take 0.5 mg by nebulization as  needed.         . cephALEXin (KEFLEX) 500 MG capsule      2 caps po bid x 7 days   28 capsule   0   . doxylamine, Sleep, (UNISOM) 25 MG tablet   Oral   Take 1 tablet (25 mg total) by mouth at bedtime as needed (nausea).   30 tablet   0   . vitamin B-6 (PYRIDOXINE) 25 MG tablet   Oral   Take 1 tablet (25 mg total) by mouth at bedtime as needed (nausea).   30 tablet   0     Allergies Shellfish allergy  No family history on file.  Social History History  Substance Use Topics  . Smoking status: Current Every Day Smoker    Types: Cigarettes  . Smokeless tobacco: Not on file  . Alcohol Use: No    Review of Systems onstitutional: No fever/chills Eyes: No visual changes. ENT: No sore throat. Cardiovascular: Denies chest pain. Respiratory: Denies shortness of breath. Gastrointestinal: No abdominal pain.  No nausea, no vomiting.  No diarrhea.  No constipation. Genitourinary: Negative for dysuria. Musculoskeletal: Negative for back pain. Skin: Negative for rash. Neurological: Negative for headaches, focal weakness or numbness. Psychiatric:Depression with intermittent thoughts of suicidality. 10-point ROS otherwise negative.  ____________________________________________   PHYSICAL EXAM:  VITAL SIGNS: ED Triage Vitals  Enc Vitals Group     BP 11/16/14 1401 135/96 mmHg     Pulse Rate 11/16/14 1401 95  Resp 11/16/14 1401 16     Temp 11/16/14 1401 98.3 F (36.8 C)     Temp Source 11/16/14 1401 Oral     SpO2 11/16/14 1401 97 %     Weight 11/16/14 1401 123 lb (55.792 kg)     Height 11/16/14 1401 5\' 4"  (1.626 m)     Head Cir --      Peak Flow --      Pain Score 11/16/14 1401 0     Pain Loc --      Pain Edu? --      Excl. in GC? --     Constitutional: Alert and oriented. Well appearing and in no acute distress. Eyes: Conjunctivae are normal. PERRL. EOMI. Head: Atraumatic. Nose: No congestion/rhinnorhea. Mouth/Throat: Mucous membranes are moist.   Oropharynx non-erythematous. Neck: No stridor.   Cardiovascular: Normal rate, regular rhythm. Grossly normal heart sounds.  Good peripheral circulation. Respiratory: Normal respiratory effort.  No retractions. Lungs CTAB. Gastrointestinal: Soft and nontender. Uterine fundus 4 cm above the pubic symphysis consistent with dates. No abdominal bruits. No CVA tenderness. Genitourinary: We'll not do secondary to already diagnosed with STDs. Musculoskeletal: No lower extremity tenderness nor edema.  No joint effusions. Neurologic:  Normal speech and language. No gross focal neurologic deficits are appreciated. Speech is normal. No gait instability. Skin:  Skin is warm, dry and intact. No rash noted. Psychiatric: Mood and affect are normal. Speech and behavior are normal.  ____________________________________________   LABS (all labs ordered are listed, but only abnormal results are displayed)  Labs Reviewed  ACETAMINOPHEN LEVEL - Abnormal; Notable for the following:    Acetaminophen (Tylenol), Serum <10 (*)    All other components within normal limits  CBC - Abnormal; Notable for the following:    WBC 18.7 (*)    All other components within normal limits  COMPREHENSIVE METABOLIC PANEL - Abnormal; Notable for the following:    ALT 13 (*)    All other components within normal limits  URINALYSIS COMPLETEWITH MICROSCOPIC ([facility] ONLY) - Abnormal; Notable for the following:    Color, Urine YELLOW (*)    APPearance CLOUDY (*)    Ketones, ur 2+ (*)    Hgb urine dipstick 1+ (*)    Protein, ur 30 (*)    Leukocytes, UA 3+ (*)    Bacteria, UA RARE (*)    Squamous Epithelial / LPF 6-30 (*)    All other components within normal limits  URINE DRUG SCREEN, QUALITATIVE ([facility] ONLY) - Abnormal; Notable for the following:    Cannabinoid 50 Ng, Ur [place] POSITIVE (*)    All other components within normal limits  PREGNANCY, URINE - Abnormal; Notable for the following:    Preg Test, Ur POSITIVE (*)    All  other components within normal limits  ETHANOL  SALICYLATE LEVEL   ____________________________________________  EKG   ____________________________________________  RADIOLOGY   ____________________________________________   PROCEDURES   ____________________________________________   INITIAL IMPRESSION / ASSESSMENT AND PLAN / ED COURSE  Pertinent labs & imaging results that were available during my care of the patient were reviewed by me and considered in my medical decision making (see chart for details).  We'll not IVC at this time is the patient is voluntarily here and is not currently having any suicidal thoughts. We'll treat for UTI as well as Trichomonas. Consult pending to psychiatry as well as behavioral health counselor. ____________________________________________   FINAL CLINICAL IMPRESSION(S) / ED DIAGNOSES  Acute depression. Acute urinary tract infection.  Initial visit.    Myrna Blazer, MD 11/16/14 [phone]

## 2014-11-16 NOTE — ED Notes (Signed)
Vol/Consult ordered

## 2014-11-16 NOTE — ED Notes (Signed)
BEHAVIORAL HEALTH ROUNDING Patient sleeping: Yes.   Patient alert and oriented: asleep Behavior appropriate: Yes.  ; If no, describe:  Nutrition and fluids offered: asleep Toileting and hygiene offered: asleep Sitter present: no Law enforcement present: Yes, ODS 

## 2014-11-16 NOTE — ED Notes (Signed)
Fetal heart rate noted to be strong and equal, positive for fetal movements.

## 2014-11-16 NOTE — ED Notes (Signed)
Patient [facility] with thoughts of hurting herself.  Patient states she can't stop crying all day.  Patient is [redacted] weeks pregnant and recently lost her home and belongings.  Patient is tearful in triage.  Patient denies plan to hurt herself.  Patient has a history of self-harm.

## 2014-11-17 DIAGNOSIS — F1210 Cannabis abuse, uncomplicated: Secondary | ICD-10-CM

## 2014-11-17 DIAGNOSIS — Z3490 Encounter for supervision of normal pregnancy, unspecified, unspecified trimester: Secondary | ICD-10-CM

## 2014-11-17 DIAGNOSIS — F4323 Adjustment disorder with mixed anxiety and depressed mood: Secondary | ICD-10-CM

## 2014-11-17 MED ORDER — ACETAMINOPHEN 325 MG PO TABS
ORAL_TABLET | ORAL | Status: AC
  Filled 2014-11-17: qty 2

## 2014-11-17 MED ORDER — ACETAMINOPHEN 325 MG PO TABS
650.0000 mg | ORAL_TABLET | Freq: Once | ORAL | Status: AC
  Administered 2014-11-17: 650 mg via ORAL

## 2014-11-17 MED ORDER — METOCLOPRAMIDE HCL 10 MG PO TABS
10.0000 mg | ORAL_TABLET | Freq: Once | ORAL | Status: AC
  Administered 2014-11-17: 10 mg via ORAL

## 2014-11-17 MED ORDER — CEPHALEXIN 500 MG PO CAPS
ORAL_CAPSULE | ORAL | Status: AC
  Filled 2014-11-17: qty 1

## 2014-11-17 MED ORDER — METRONIDAZOLE 500 MG PO TABS
ORAL_TABLET | ORAL | Status: AC
  Administered 2014-11-17: 500 mg via ORAL
  Filled 2014-11-17: qty 1

## 2014-11-17 MED ORDER — METOCLOPRAMIDE HCL 10 MG PO TABS
ORAL_TABLET | ORAL | Status: AC
  Filled 2014-11-17: qty 1

## 2014-11-17 MED ORDER — CEPHALEXIN 500 MG PO CAPS
ORAL_CAPSULE | ORAL | Status: AC
  Administered 2014-11-17: 500 mg via ORAL
  Filled 2014-11-17: qty 1

## 2014-11-17 NOTE — ED Notes (Signed)
BEHAVIORAL HEALTH ROUNDING Patient sleeping: Yes.   Patient alert and oriented: asleep Behavior appropriate: Yes.  ; If no, describe:  Nutrition and fluids offered: asleep Toileting and hygiene offered: asleep Sitter present: no Law enforcement present: Yes, ODS 

## 2014-11-17 NOTE — ED Notes (Signed)
Pt resting in bed with eyes closed. No unusual behavior observed. Pt has no needs or concerns at this time. Will continue to monitor and f/u as needed.  

## 2014-11-17 NOTE — ED Notes (Signed)

## 2014-11-17 NOTE — ED Notes (Signed)
Report called to Bill in [place]

## 2014-11-17 NOTE — ED Notes (Signed)
Pt. Noted sleeping in room. No complaints or concerns voiced. No distress or abnormal behavior noted. Will continue to monitor with security cameras. Q 15 minute rounds continue. 

## 2014-11-17 NOTE — ED Notes (Signed)
Pt provided with sandwich tray and drink.  

## 2014-11-17 NOTE — ED Notes (Signed)
BEHAVIORAL HEALTH ROUNDING Patient sleeping: Yes.   Patient alert and oriented: not applicable Behavior appropriate: Yes.  ; If no, describe:  Nutrition and fluids offered: Yes  Toileting and hygiene offered: Yes  Sitter present: no Law enforcement present: Yes  

## 2014-11-17 NOTE — ED Notes (Signed)

## 2014-11-17 NOTE — ED Notes (Signed)
BEHAVIORAL HEALTH ROUNDING Patient sleeping: No. Patient alert and oriented: yes Behavior appropriate: Yes.  ; If no, describe:  Nutrition and fluids offered: Yes  Toileting and hygiene offered: Yes  Sitter present: no Law enforcement present: Yes  

## 2014-11-17 NOTE — ED Provider Notes (Signed)
-----------------------------------------   7:32 AM on 11/17/2014 -----------------------------------------   BP 100/54 mmHg  Pulse 53  Temp(Src) 98.5 F (36.9 C) (Oral)  Resp 16  Ht 5\' 4"  (1.626 m)  Wt 123 lb (55.792 kg)  BMI 21.10 kg/m2  SpO2 100%  LMP 07/17/2014  The patient had no acute events since last update.  Calm and cooperative at this time.  Disposition is pending per Psychiatry/Behavioral Medicine team recommendations.     Leona Carry, MD 11/17/14 [phone]

## 2014-11-17 NOTE — ED Notes (Signed)

## 2014-11-17 NOTE — ED Notes (Signed)
ED BHU PLACEMENT JUSTIFICATION Is the patient under IVC or is there intent for IVC: No. Is the patient medically cleared: Yes.   Is there vacancy in the ED BHU: Yes.   Is the population mix appropriate for patient: Yes.   Is the patient awaiting placement in inpatient or outpatient setting: Yes.   Has the patient had a psychiatric consult: Yes.   Survey of unit performed for contraband, proper placement and condition of furniture, tampering with fixtures in bathroom, shower, and each patient room: Yes.  ; Findings:  APPEARANCE/BEHAVIOR calm NEURO ASSESSMENT Orientation: AAO x3 Hallucinations: No.None noted (Hallucinations) Speech: Normal Gait: wnl RESPIRATORY ASSESSMENT wnl CARDIOVASCULAR ASSESSMENT wnl GASTROINTESTINAL ASSESSMENT wnl EXTREMITIES Moves all extremities  PLAN OF CARE Provide calm/safe environment. Vital signs assessed twice daily. ED BHU Assessment once each 12-hour shift. Collaborate with intake RN daily or as condition indicates. Assure the ED provider has rounded once each shift. Provide and encourage hygiene. Provide redirection as needed. Assess for escalating behavior; address immediately and inform ED provider.  Assess family dynamic and appropriateness for visitation as needed: Yes.  ; If necessary, describe findings:  Educate the patient/family about BHU procedures/visitation: Yes.  ; If necessary, describe findings:

## 2014-11-17 NOTE — Consult Note (Signed)
Casey Owens Face-to-Face Psychiatry Consult   Reason for Consult:  Patient is a 24 year old woman came to the emergency room voluntarily with a chief complaint of being under a lot of stress Referring Physician:  "I'm under a lot of stress Patient Identification: Casey Owens MRN:  [id number] Principal Diagnosis: Adjustment disorder with mixed anxiety and depressed mood Diagnosis:   Patient Active Problem List   Diagnosis Date Noted  . Adjustment disorder with mixed anxiety and depressed mood [F43.23] 11/17/2014  . Pregnant [Z33.1] 11/17/2014  . Cannabis abuse [F12.10] 11/17/2014    Total Time spent with patient: 1 hour  Subjective:   [name] is a 24 y.o. female patient admitted with patient is reporting that she is homeless, has 2 children, is pregnant, has no income and feels overwhelmed with anxiety and stress. Mood has been sad area and sleep has been poor. Denies suicidal ideation.Marland Kitchen  HPI:  Information from the patient and the chart. Patient came in to the emergency room voluntarily because she felt she has nowhere else to turn. She has been feeling bad and depressed recently probably for at least a week. Crying frequently. Plus and overwhelmed. She denies having any suicidal thoughts. Denies any psychotic symptoms. Major stresses include being homeless. She had been staying at the homeless shelter but says that she was thrown out because she violated one of the rules. She says that her mother is refusing to offer any assistance to her although the mother is taking care of HER-2 children. Patient is not currently getting any outpatient psychiatric treatment. She admits that she uses marijuana regularly not on any other medication.  Past psychiatric history: Patient has had at least 1 prior psychiatric hospitalization at our facility in 2011. At that time she was also under a great deal of stress. She was using drugs more regularly at that time but also her suicidal ideation  cleared up. She was diagnosed with depression but it was noted that she had a history of impulsivity and self injury and substance abuse. Patient denies ever seriously trying to kill her self.  Social history: Sounds like she's been involved with a series of irresponsible men who aren't providing any assistance to her area she has 2 children ages 56 and 17 and she is 4 months pregnant. Closest relative is her mother who lives nearby. Patient is not currently working and complains of not being able to find a job.  Medical history: 4 months pregnant. No other significant medical problems except that she had a urinary tract infection.  Family history: Says that her biological father was bipolar and had a alcohol problem.  Current medication prenatal vitamins also and biotics being started here for urinary tract infection. HPI Elements:   Quality:  Anxiety's sadness feeling overwhelmed. Severity:  Moderate but not clearly life threatening. Timing:  Much worse since having no place to live. Duration:  Chronic problem with coping. Context:  Homelessness pregnancy testing a single mother.  Past Medical History:  Past Medical History  Diagnosis Date  . Asthma    History reviewed. No pertinent past surgical history. Family History: No family history on file. Social History:  History  Alcohol Use No     History  Drug Use No    History   Social History  . Marital Status: Single    Spouse Name: N/A  . Number of Children: N/A  . Years of Education: N/A   Social History Main Topics  . Smoking status: Current Every Day  Smoker    Types: Cigarettes  . Smokeless tobacco: Not on file  . Alcohol Use: No  . Drug Use: No  . Sexual Activity: Yes    Birth Control/ Protection: None   Other Topics Concern  . None   Social History Narrative   Additional Social History:    Pain Medications: None reported Prescriptions: None reported Over the Counter: None reported History of alcohol / drug  use?: No history of alcohol / drug abuse (None reported) Longest period of sobriety (when/how long):  (None reported) Negative Consequences of Use:  (None reported) Withdrawal Symptoms:  (none reported)                     Allergies:   Allergies  Allergen Reactions  . Shellfish Allergy     Labs:  Results for orders placed or performed during the hospital encounter of 11/16/14 (from the past 48 hour(s))  Acetaminophen level     Status: Abnormal   Collection Time: 11/16/14  2:28 PM  Result Value Ref Range   Acetaminophen (Tylenol), Serum <10 (L) 10 - 30 ug/mL    Comment:        THERAPEUTIC CONCENTRATIONS VARY SIGNIFICANTLY. A RANGE OF 10-30 ug/mL MAY BE AN EFFECTIVE CONCENTRATION FOR MANY PATIENTS. HOWEVER, SOME ARE BEST TREATED AT CONCENTRATIONS OUTSIDE THIS RANGE. ACETAMINOPHEN CONCENTRATIONS >150 ug/mL AT 4 HOURS AFTER INGESTION AND >50 ug/mL AT 12 HOURS AFTER INGESTION ARE OFTEN ASSOCIATED WITH TOXIC REACTIONS.   CBC     Status: Abnormal   Collection Time: 11/16/14  2:28 PM  Result Value Ref Range   WBC 18.7 (H) 3.6 - 11.0 K/uL   RBC 4.30 3.80 - 5.20 MIL/uL   Hemoglobin 13.8 12.0 - 16.0 g/dL   HCT 41.6 35.0 - 47.0 %   MCV 96.8 80.0 - 100.0 fL   MCH 32.1 26.0 - 34.0 pg   MCHC 33.1 32.0 - 36.0 g/dL   RDW 13.0 11.5 - 14.5 %   Platelets 263 150 - 440 K/uL  Comprehensive metabolic panel     Status: Abnormal   Collection Time: 11/16/14  2:28 PM  Result Value Ref Range   Sodium 135 135 - 145 mmol/L   Potassium 3.7 3.5 - 5.1 mmol/L   Chloride 104 101 - 111 mmol/L   CO2 23 22 - 32 mmol/L   Glucose, Bld 90 65 - 99 mg/dL   BUN 10 6 - 20 mg/dL   Creatinine, Ser 0.69 0.44 - 1.00 mg/dL   Calcium 9.2 8.9 - 10.3 mg/dL   Total Protein 7.7 6.5 - 8.1 g/dL   Albumin 3.6 3.5 - 5.0 g/dL   AST 18 15 - 41 U/L   ALT 13 (L) 14 - 54 U/L   Alkaline Phosphatase 73 38 - 126 U/L   Total Bilirubin 0.7 0.3 - 1.2 mg/dL   GFR calc non Af Amer >60 >60 mL/min   GFR calc Af Amer  >60 >60 mL/min    Comment: (NOTE) The eGFR has been calculated using the CKD EPI equation. This calculation has not been validated in all clinical situations. eGFR's persistently <60 mL/min signify possible Chronic Kidney Disease.    Anion gap 8 5 - 15  Ethanol (ETOH)     Status: None   Collection Time: 11/16/14  2:28 PM  Result Value Ref Range   Alcohol, Ethyl (B) <5 <5 mg/dL    Comment:        LOWEST DETECTABLE LIMIT FOR SERUM ALCOHOL IS  5 mg/dL FOR MEDICAL PURPOSES ONLY   Salicylate level     Status: None   Collection Time: 11/16/14  2:28 PM  Result Value Ref Range   Salicylate Lvl <8.2 2.8 - 30.0 mg/dL  Urinalysis complete, with microscopic ([facility] only)     Status: Abnormal   Collection Time: 11/16/14  2:46 PM  Result Value Ref Range   Color, Urine YELLOW (A) YELLOW   APPearance CLOUDY (A) CLEAR   Glucose, UA NEGATIVE NEGATIVE mg/dL   Bilirubin Urine NEGATIVE NEGATIVE   Ketones, ur 2+ (A) NEGATIVE mg/dL   Specific Gravity, Urine 1.030 1.005 - 1.030   Hgb urine dipstick 1+ (A) NEGATIVE   pH 5.0 5.0 - 8.0   Protein, ur 30 (A) NEGATIVE mg/dL   Nitrite NEGATIVE NEGATIVE   Leukocytes, UA 3+ (A) NEGATIVE   RBC / HPF TOO NUMEROUS TO COUNT 0 - 5 RBC/hpf   WBC, UA TOO NUMEROUS TO COUNT 0 - 5 WBC/hpf   Bacteria, UA RARE (A) NONE SEEN   Squamous Epithelial / LPF 6-30 (A) NONE SEEN   Mucous PRESENT   Urine Drug Screen, Qualitative ([facility] only)     Status: Abnormal   Collection Time: 11/16/14  2:46 PM  Result Value Ref Range   Tricyclic, Ur Screen NONE DETECTED NONE DETECTED   Amphetamines, Ur Screen NONE DETECTED NONE DETECTED   MDMA (Ecstasy)Ur Screen NONE DETECTED NONE DETECTED   Cocaine Metabolite,Ur [place] NONE DETECTED NONE DETECTED   Opiate, Ur Screen NONE DETECTED NONE DETECTED   Phencyclidine (PCP) Ur S NONE DETECTED NONE DETECTED   Cannabinoid 50 Ng, Ur [place] POSITIVE (A) NONE DETECTED   Barbiturates, Ur Screen NONE DETECTED NONE DETECTED   Benzodiazepine, Ur Scrn NONE  DETECTED NONE DETECTED   Methadone Scn, Ur NONE DETECTED NONE DETECTED    Comment: (NOTE) 423  Tricyclics, urine               Cutoff 1000 ng/mL 200  Amphetamines, urine             Cutoff 1000 ng/mL 300  MDMA (Ecstasy), urine           Cutoff 500 ng/mL 400  Cocaine Metabolite, urine       Cutoff 300 ng/mL 500  Opiate, urine                   Cutoff 300 ng/mL 600  Phencyclidine (PCP), urine      Cutoff 25 ng/mL 700  Cannabinoid, urine              Cutoff 50 ng/mL 800  Barbiturates, urine             Cutoff 200 ng/mL 900  Benzodiazepine, urine           Cutoff 200 ng/mL 1000 Methadone, urine                Cutoff 300 ng/mL 1100 1200 The urine drug screen provides only a preliminary, unconfirmed 1300 analytical test result and should not be used for non-medical 1400 purposes. Clinical consideration and professional judgment should 1500 be applied to any positive drug screen result due to possible 1600 interfering substances. A more specific alternate chemical method 1700 must be used in order to obtain a confirmed analytical result.  1800 Gas chromato graphy / mass spectrometry (GC/MS) is the preferred 1900 confirmatory method.   Pregnancy, urine (if pre-menopausal female)     Status: Abnormal   Collection Time: 11/16/14  2:46  PM  Result Value Ref Range   Preg Test, Ur POSITIVE (A) NEGATIVE    Vitals: Blood pressure 101/62, pulse 64, temperature 98 F (36.7 C), temperature source Oral, resp. rate 16, height $RemoveBe'5\' 4"'pRfwIwpXY$  (1.626 m), weight 55.792 kg (123 lb), last menstrual period 07/17/2014, SpO2 98 %.  Risk to Self: Suicidal Ideation: Yes-Currently Present Suicidal Intent: No Is patient at risk for suicide?: No Suicidal Plan?: No Access to Means: No What has been your use of drugs/alcohol within the last 12 months?: None reported Other Self Harm Risks: Hx of cutting Triggers for Past Attempts: Other (Comment) (Conflict with others) Intentional Self Injurious Behavior: Cutting Comment  - Self Injurious Behavior: Hx of cutting arm Risk to Others: Homicidal Ideation: No Thoughts of Harm to Others: No Current Homicidal Intent: No Current Homicidal Plan: No Access to Homicidal Means: No Identified Victim: N/A History of harm to others?: No Assessment of Violence: None Noted Violent Behavior Description: N/A Does patient have access to weapons?: No Criminal Charges Pending?: No Does patient have a court date: No Prior Inpatient Therapy: Prior Inpatient Therapy: No Reason for Treatment: Symptoms of depression Prior Outpatient Therapy: Prior Outpatient Therapy: No Does patient have an ACCT team?: No Does patient have Intensive In-House Services?  : No Does patient have [facility] services? : No Does patient have P4[facility] services?: No  Current Facility-Administered Medications  Medication Dose Route Frequency Provider Last Rate Last Dose  . cephALEXin (KEFLEX) capsule 500 mg  500 mg Oral 3 times per day Orbie Pyo, MD   500 mg at 11/17/14 1425  . metroNIDAZOLE (FLAGYL) tablet 500 mg  500 mg Oral Q12H Orbie Pyo, MD   500 mg at 11/17/14 1021  . prenatal multivitamin tablet 1 tablet  1 tablet Oral Q1200 Orbie Pyo, MD   1 tablet at 11/17/14 1233   Current Outpatient Prescriptions  Medication Sig Dispense Refill  . Doxylamine-Pyridoxine (DICLEGIS) 10-10 MG TBEC Take 1-4 tablets by mouth daily. 2 tablets at bedtime for 2 days; if symptoms persist, 1 tablet every morning, 2 tablets at bedtime for 2 days; if symptoms persist, 1 tablet every morning, 1 tablet every afternoon, and 2 tablets at bedtime      Musculoskeletal: Strength & Muscle Tone: within normal limits Gait & Station: normal Patient leans: N/A  Psychiatric Specialty Exam: Physical Exam  Constitutional: She appears well-developed and well-nourished.  HENT:  Head: Normocephalic and atraumatic.  Eyes: Conjunctivae are normal. Pupils are equal, round, and reactive to light.   Neck: Normal range of motion.  Cardiovascular: Normal heart sounds.   Respiratory: Effort normal.  GI: Soft. She exhibits distension.  Musculoskeletal: Normal range of motion.  Neurological: She is alert.  Skin: Skin is warm and dry.  Psychiatric: Her speech is normal and behavior is normal. Thought content normal. Cognition and memory are normal. She expresses impulsivity. She exhibits a depressed mood.  Patient became tearful during the interview but was lucid and free of any psychotic symptoms and free of any suicidal ideation.    Review of Systems  Constitutional: Negative.   HENT: Negative.   Eyes: Negative.   Respiratory: Negative.   Cardiovascular: Negative.   Gastrointestinal: Negative.   Musculoskeletal: Negative.   Skin: Negative.   Neurological: Negative.   Psychiatric/Behavioral: Positive for depression and substance abuse. Negative for suicidal ideas and hallucinations. The patient is nervous/anxious and has insomnia.     Blood pressure 101/62, pulse 64, temperature 98 F (36.7 C), temperature source Oral, resp.  rate 16, height $RemoveBe'5\' 4"'KyeVtHFPS$  (1.626 m), weight 55.792 kg (123 lb), last menstrual period 07/17/2014, SpO2 98 %.Body mass index is 21.1 kg/(m^2).  General Appearance: Disheveled  Eye Contact::  Good  Speech:  Clear and Coherent  Volume:  Increased  Mood:  Hopeless  Affect:  Labile and Full Range  Thought Process:  Coherent  Orientation:  Full (Time, Place, and Person)  Thought Content:  Negative  Suicidal Thoughts:  No  Homicidal Thoughts:  No  Memory:  Immediate;   Good Recent;   Good Remote;   Good  Judgement:  Impaired  Insight:  Lacking  Psychomotor Activity:  Normal  Concentration:  Fair  Recall:  [employer] of Knowledge:Fair  Language: Good  Akathisia:  No  Handed:  Right  AIMS (if indicated):     Assets:  Communication Skills Resilience  ADL's:  Intact  Cognition: WNL  Sleep:      Medical Decision Making: Review of Psycho-Social Stressors  (1), Review or order clinical lab tests (1), Established Problem, Worsening (2) and Review or order medicine tests (1)  Treatment Plan Summary: Plan Patient appears to be having something of an adjustment disorder to overwhelming stress. She is not psychotic however and is not reporting suicidal ideation. She does feel hopeless about her situation and presents herself as having nowhere to go. Patient is abusing marijuana. Diagnosis will be adjustment disorder and cannabis abuse. Also pregnancy. I don't think that she requires inpatient hospitalization and certainly doesn't go to the top of the list for a psychiatric bed. Continue monitoring in the emergency room reevaluate after social work gets a chance to talk with her.  Plan:  Supportive therapy provided about ongoing stressors. Disposition: Continue monitoring here no indication for change in any medication. Supportive therapy  Alethia Berthold 11/17/2014 5:22 PM

## 2014-11-17 NOTE — ED Notes (Signed)
BEHAVIORAL HEALTH ROUNDING Patient sleeping: no Patient alert and oriented:yes Behavior appropriate: Yes.  ; If no, describe:  Nutrition and fluids offered: Yes  Toileting and hygiene offered: Yes  Sitter present: no Law enforcement present: Yes, ODS 

## 2014-11-17 NOTE — ED Notes (Signed)
Pt. Noted in room. No complaints or concerns voiced. No distress or abnormal behavior noted. Will continue to monitor with security cameras. Q 15 minute rounds continue. Sandwich and soft drink given. 

## 2014-11-17 NOTE — ED Notes (Addendum)
Report received from [facility]. Pt. Alert and oriented in no distress denies SI, HI, AVH.  Pt. Instructed to come to me with problems or concerns.Will continue to monitor for safety via security cameras and Q 15 minute checks.

## 2014-11-17 NOTE — ED Notes (Signed)

## 2014-11-18 MED ORDER — METOCLOPRAMIDE HCL 10 MG PO TABS
10.0000 mg | ORAL_TABLET | Freq: Once | ORAL | Status: AC
  Administered 2014-11-18: 10 mg via ORAL
  Filled 2014-11-18: qty 1

## 2014-11-18 MED ORDER — CEPHALEXIN 500 MG PO CAPS: 500.0000 mg | ORAL_CAPSULE | Freq: Three times a day (TID) | ORAL | Status: DC

## 2014-11-18 MED ORDER — METRONIDAZOLE 500 MG PO TABS: 500.0000 mg | ORAL_TABLET | Freq: Two times a day (BID) | ORAL | Status: DC

## 2014-11-18 MED ORDER — METOCLOPRAMIDE HCL 10 MG PO TABS
ORAL_TABLET | ORAL | Status: AC
  Administered 2014-11-18: 10 mg via ORAL
  Filled 2014-11-18: qty 1

## 2014-11-18 MED ORDER — CEPHALEXIN 500 MG PO CAPS
ORAL_CAPSULE | ORAL | Status: AC
  Filled 2014-11-18: qty 1

## 2014-11-18 NOTE — ED Notes (Signed)
Pt. Noted sleeping n room. No complaints or concerns voiced. No distress or abnormal behavior noted. Will continue to monitor with security cameras. Q 15 minute rounds continue.  

## 2014-11-18 NOTE — ED Provider Notes (Signed)
-----------------------------------------   5:46 PM on 11/18/2014 -----------------------------------------   BP 104/52 mmHg  Pulse 51  Temp(Src) 98.6 F (37 C) (Oral)  Resp 19  Ht 5\' 4"  (1.626 m)  Wt 123 lb (55.792 kg)  BMI 21.10 kg/m2  SpO2 100%  LMP 07/17/2014   I discussed this patient's situation with Dr. Toni Amend. He is advising that we discharge the patient and she can follow-up at [facility]. She'll be provided with appropriate materials to assist her with this. She will be going to the shelter from here.  Darien Ramus, MD 11/18/14 [phone]

## 2014-11-18 NOTE — ED Notes (Signed)
Pt. Noted sleeping in room. No complaints or concerns voiced. No distress or abnormal behavior noted. Will continue to monitor with security cameras. Q 15 minute rounds continue. 

## 2014-11-18 NOTE — ED Notes (Signed)
BEHAVIORAL HEALTH ROUNDING Patient sleeping: No. Patient alert and oriented: yes Behavior appropriate: Yes.  ; If no, describe:  Nutrition and fluids offered: Yes  Toileting and hygiene offered: Yes  Sitter present: no Law enforcement present: Yes  

## 2014-11-18 NOTE — Discharge Instructions (Signed)
Depression Depression is feeling sad, low, down in the dumps, blue, gloomy, or empty. In general, there are two kinds of depression:  Normal sadness or grief. This can happen after something upsetting. It often goes away on its own within 2 weeks. After losing a loved one (bereavement), normal sadness and grief may last longer than two weeks. It usually gets better with time.  Clinical depression. This kind lasts longer than normal sadness or grief. It keeps you from doing the things you normally do in life. It is often hard to function at home, work, or at school. It may affect your relationships with others. Treatment is often needed. GET HELP RIGHT AWAY IF:  You have thoughts about hurting yourself or others.  You lose touch with reality (psychotic symptoms). You may:  See or hear things that are not real.  Have untrue beliefs about your life or people around you.  Your medicine is giving you problems. MAKE SURE YOU:  Understand these instructions.  Will watch your condition.  Will get help right away if you are not doing well or get worse. Document Released: 06/15/2010 Document Revised: 09/27/2013 Document Reviewed: 09/12/2011 [facility] Patient Information 2015 [place], [place]. This information is not intended to replace advice given to you by your health care provider. Make sure you discuss any questions you have with your health care provider.  Urinary Tract Infection Urinary tract infections (UTIs) can develop anywhere along your urinary tract. Your urinary tract is your body's drainage system for removing wastes and extra water. Your urinary tract includes two kidneys, two ureters, a bladder, and a urethra. Your kidneys are a pair of bean-shaped organs. Each kidney is about the size of your fist. They are located below your ribs, one on each side of your spine. CAUSES Infections are caused by microbes, which are microscopic organisms, including fungi, viruses, and bacteria. These  organisms are so small that they can only be seen through a microscope. Bacteria are the microbes that most commonly cause UTIs. SYMPTOMS  Symptoms of UTIs may vary by age and gender of the patient and by the location of the infection. Symptoms in young women typically include a frequent and intense urge to urinate and a painful, burning feeling in the bladder or urethra during urination. Older women and men are more likely to be tired, shaky, and weak and have muscle aches and abdominal pain. A fever may mean the infection is in your kidneys. Other symptoms of a kidney infection include pain in your back or sides below the ribs, nausea, and vomiting. DIAGNOSIS To diagnose a UTI, your caregiver will ask you about your symptoms. Your caregiver also will ask to provide a urine sample. The urine sample will be tested for bacteria and white blood cells. White blood cells are made by your body to help fight infection. TREATMENT  Typically, UTIs can be treated with medication. Because most UTIs are caused by a bacterial infection, they usually can be treated with the use of antibiotics. The choice of antibiotic and length of treatment depend on your symptoms and the type of bacteria causing your infection. HOME CARE INSTRUCTIONS  If you were prescribed antibiotics, take them exactly as your caregiver instructs you. Finish the medication even if you feel better after you have only taken some of the medication.  Drink enough water and fluids to keep your urine clear or pale yellow.  Avoid caffeine, tea, and carbonated beverages. They tend to irritate your bladder.  Empty your bladder  often. Avoid holding urine for long periods of time.  Empty your bladder before and after sexual intercourse.  After a bowel movement, women should cleanse from front to back. Use each tissue only once. SEEK MEDICAL CARE IF:   You have back pain.  You develop a fever.  Your symptoms do not begin to resolve within 3  days. SEEK IMMEDIATE MEDICAL CARE IF:   You have severe back pain or lower abdominal pain.  You develop chills.  You have nausea or vomiting.  You have continued burning or discomfort with urination. MAKE SURE YOU:   Understand these instructions.  Will watch your condition.  Will get help right away if you are not doing well or get worse. Document Released: 02/20/2005 Document Revised: 11/12/2011 Document Reviewed: 06/21/2011 [facility] Patient Information 2015 [place], [place]. This information is not intended to replace advice given to you by your health care provider. Make sure you discuss any questions you have with your health care provider.

## 2014-11-18 NOTE — Progress Notes (Signed)
CSW met with patient for continued assessment.  Patient states she is homeless and has no support system.  Children currently living with her mother however that is not an option for her at this time.  Patient denies SI, HI AH and VH.  However, she continues to express that she is under a lot of stress.  Stated she has lost everything since Jan. 2016.  Lost job, her home and her car.  Informed CSW  "my  mind will not shut off, it keeps going and going".  Reports unable to sleep and is very tearful and crying during interview.   Per patient she has never had outpatient therapy or prior inpatient treatment.  States she needs some help as she is unable to think.   At this time patient is homeless with no options of where to go when leaving [facility].  CSW discussed shelter options with patient and provided phone number for her to call in the event patient is discharged today.   Psyc MD will re-evaluation patient today.  CSW will continue to follow and assist with disposition.   Casimer Lanius. Latanya Presser, MSW Clinical Social Work Department Emergency Room [phone] 10:13 AM

## 2014-11-18 NOTE — ED Notes (Signed)
ED BHU PLACEMENT JUSTIFICATION Is the patient under IVC or is there intent for IVC: No. Is the patient medically cleared: Yes.   Is there vacancy in the ED BHU: Yes.   Is the population mix appropriate for patient: Yes.   Is the patient awaiting placement in inpatient or outpatient setting: Yes.   Has the patient had a psychiatric consult: Yes.   Survey of unit performed for contraband, proper placement and condition of furniture, tampering with fixtures in bathroom, shower, and each patient room: Yes.  ; Findings:  APPEARANCE/BEHAVIOR calm and cooperative NEURO ASSESSMENT Orientation: time, place and person Hallucinations: No.None noted (Hallucinations) Speech: Normal Gait: normal RESPIRATORY ASSESSMENT Normal expansion.  Clear to auscultation.  No rales, rhonchi, or wheezing. CARDIOVASCULAR ASSESSMENT regular rate and rhythm, S1, S2 normal, no murmur, click, rub or gallop GASTROINTESTINAL ASSESSMENT soft, nontender, BS WNL, no r/g EXTREMITIES normal strength, tone, and muscle mass PLAN OF CARE Provide calm/safe environment. Vital signs assessed twice daily. ED BHU Assessment once each 12-hour shift. Collaborate with intake RN daily or as condition indicates. Assure the ED provider has rounded once each shift. Provide and encourage hygiene. Provide redirection as needed. Assess for escalating behavior; address immediately and inform ED provider.  Assess family dynamic and appropriateness for visitation as needed: Yes.  ; If necessary, describe findings:  Educate the patient/family about BHU procedures/visitation: Yes.  ; If necessary, describe findings:  

## 2014-11-18 NOTE — ED Notes (Signed)
BEHAVIORAL HEALTH ROUNDING Patient sleeping: Yes.   Patient alert and oriented: not applicable Behavior appropriate: Yes.  ; If no, describe:  Nutrition and fluids offered: No Toileting and hygiene offered: No Sitter present: no Law enforcement present: Yes  

## 2014-11-18 NOTE — Consult Note (Signed)
[facility] Face-to-Face Psychiatry Consult   Reason for Consult:  Patient is a 24 year old woman came to the emergency room voluntarily with a chief complaint of being under a lot of stress Referring Physician:  "I'm under a lot of stress Patient Identification: Casey Owens MRN:  [id number] Principal Diagnosis: Adjustment disorder with mixed anxiety and depressed mood Diagnosis:   Patient Active Problem List   Diagnosis Date Noted  . Adjustment disorder with mixed anxiety and depressed mood [F43.23] 11/17/2014  . Pregnant [Z33.1] 11/17/2014  . Cannabis abuse [F12.10] 11/17/2014    Total Time spent with patient: 1 hour  Subjective:   [name] is a 24 y.o. female patient admitted with patient is reporting that she is homeless, has 2 children, is pregnant, has no income and feels overwhelmed with anxiety and stress. Mood has been sad area and sleep has been poor. Denies suicidal ideation.Marland Kitchen  HPI:  Information from the patient and the chart. Patient came in to the emergency room voluntarily because she felt she has nowhere else to turn. She has been feeling bad and depressed recently probably for at least a week. Crying frequently. Plus and overwhelmed. She denies having any suicidal thoughts. Denies any psychotic symptoms. Major stresses include being homeless. She had been staying at the homeless shelter but says that she was thrown out because she violated one of the rules. She says that her mother is refusing to offer any assistance to her although the mother is taking care of HER-2 children. Patient is not currently getting any outpatient psychiatric treatment. She admits that she uses marijuana regularly not on any other medication.  Past psychiatric history: Patient has had at least 1 prior psychiatric hospitalization at our facility in 2011. At that time she was also under a great deal of stress. She was using drugs more regularly at that time but also her suicidal ideation  cleared up. She was diagnosed with depression but it was noted that she had a history of impulsivity and self injury and substance abuse. Patient denies ever seriously trying to kill her self.  Social history: Sounds like she's been involved with a series of irresponsible men who aren't providing any assistance to her area she has 2 children ages 52 and 75 and she is 4 months pregnant. Closest relative is her mother who lives nearby. Patient is not currently working and complains of not being able to find a job.  Medical history: 4 months pregnant. No other significant medical problems except that she had a urinary tract infection.  Family history: Says that her biological father was bipolar and had a alcohol problem.  Current medication prenatal vitamins also and biotics being started here for urinary tract infection.  Follow-up as of Friday. Patient has no new complaints. She has slept well and eating well. Today her affect is calm her. She is not crying and doesn't seem as overwhelmed. Continues to deny any suicidal ideation. Patient does not require inpatient hospitalization. HPI Elements:   Quality:  Anxiety's sadness feeling overwhelmed. Severity:  Moderate but not clearly life threatening. Timing:  Much worse since having no place to live. Duration:  Chronic problem with coping. Context:  Homelessness pregnancy testing a single mother.  Past Medical History:  Past Medical History  Diagnosis Date  . Asthma    History reviewed. No pertinent past surgical history. Family History: No family history on file. Social History:  History  Alcohol Use No     History  Drug Use No  History   Social History  . Marital Status: Single    Spouse Name: N/A  . Number of Children: N/A  . Years of Education: N/A   Social History Main Topics  . Smoking status: Current Every Day Smoker    Types: Cigarettes  . Smokeless tobacco: Not on file  . Alcohol Use: No  . Drug Use: No  . Sexual  Activity: Yes    Birth Control/ Protection: None   Other Topics Concern  . None   Social History Narrative   Additional Social History:    Pain Medications: None reported Prescriptions: None reported Over the Counter: None reported History of alcohol / drug use?: No history of alcohol / drug abuse (None reported) Longest period of sobriety (when/how long):  (None reported) Negative Consequences of Use:  (None reported) Withdrawal Symptoms:  (none reported)                     Allergies:   Allergies  Allergen Reactions  . Shellfish Allergy     Labs:  No results found for this or any previous visit (from the past 48 hour(s)).  Vitals: Blood pressure 104/52, pulse 51, temperature 98.6 F (37 C), temperature source Oral, resp. rate 19, height 5' 4" (1.626 m), weight 55.792 kg (123 lb), last menstrual period 07/17/2014, SpO2 100 %.  Risk to Self: Suicidal Ideation: Yes-Currently Present Suicidal Intent: No Is patient at risk for suicide?: No Suicidal Plan?: No Access to Means: No What has been your use of drugs/alcohol within the last 12 months?: None reported Other Self Harm Risks: Hx of cutting Triggers for Past Attempts: Other (Comment) (Conflict with others) Intentional Self Injurious Behavior: Cutting Comment - Self Injurious Behavior: Hx of cutting arm Risk to Others: Homicidal Ideation: No Thoughts of Harm to Others: No Current Homicidal Intent: No Current Homicidal Plan: No Access to Homicidal Means: No Identified Victim: N/A History of harm to others?: No Assessment of Violence: None Noted Violent Behavior Description: N/A Does patient have access to weapons?: No Criminal Charges Pending?: No Does patient have a court date: No Prior Inpatient Therapy: Prior Inpatient Therapy: No Reason for Treatment: Symptoms of depression Prior Outpatient Therapy: Prior Outpatient Therapy: No Does patient have an ACCT team?: No Does patient have Intensive In-House  Services?  : No Does patient have [facility] services? : No Does patient have P4CC services?: No  Current Facility-Administered Medications  Medication Dose Route Frequency Provider Last Rate Last Dose  . cephALEXin (KEFLEX) capsule 500 mg  500 mg Oral 3 times per day Orbie Pyo, MD   500 mg at 11/18/14 0606  . metroNIDAZOLE (FLAGYL) tablet 500 mg  500 mg Oral Q12H Orbie Pyo, MD   500 mg at 11/18/14 0948  . prenatal multivitamin tablet 1 tablet  1 tablet Oral Q1200 Orbie Pyo, MD   Stopped at 11/18/14 1202   Current Outpatient Prescriptions  Medication Sig Dispense Refill  . Doxylamine-Pyridoxine (DICLEGIS) 10-10 MG TBEC Take 1-4 tablets by mouth daily. 2 tablets at bedtime for 2 days; if symptoms persist, 1 tablet every morning, 2 tablets at bedtime for 2 days; if symptoms persist, 1 tablet every morning, 1 tablet every afternoon, and 2 tablets at bedtime    . cephALEXin (KEFLEX) 500 MG capsule Take 1 capsule (500 mg total) by mouth 3 (three) times daily. 15 capsule 0  . metroNIDAZOLE (FLAGYL) 500 MG tablet Take 1 tablet (500 mg total) by mouth every 12 (twelve) hours.  10 tablet 0    Musculoskeletal: Strength & Muscle Tone: within normal limits Gait & Station: normal Patient leans: N/A  Psychiatric Specialty Exam: Physical Exam  Constitutional: She appears well-developed and well-nourished.  HENT:  Head: Normocephalic and atraumatic.  Eyes: Conjunctivae are normal. Pupils are equal, round, and reactive to light.  Neck: Normal range of motion.  Cardiovascular: Normal heart sounds.   Respiratory: Effort normal.  GI: Soft. She exhibits distension.  Musculoskeletal: Normal range of motion.  Neurological: She is alert.  Skin: Skin is warm and dry.  Psychiatric: Her speech is normal and behavior is normal. Thought content normal. Cognition and memory are normal. She expresses impulsivity. She exhibits a depressed mood.  She is much calmer today.  Cooperative. Continues to denies suicidal ideation. She continues to insist that she cannot live with her mother. She is willing however to go back to the shelter. No indication for any prescription of psychiatric medicine. Patient is strongly encouraged to follow-up with outpatient counseling.    Review of Systems  Constitutional: Negative.   HENT: Negative.   Eyes: Negative.   Respiratory: Negative.   Cardiovascular: Negative.   Gastrointestinal: Negative.   Musculoskeletal: Negative.   Skin: Negative.   Neurological: Negative.   Psychiatric/Behavioral: Positive for depression and substance abuse. Negative for suicidal ideas and hallucinations. The patient is nervous/anxious and has insomnia.     Blood pressure 104/52, pulse 51, temperature 98.6 F (37 C), temperature source Oral, resp. rate 19, height 5' 4" (1.626 m), weight 55.792 kg (123 lb), last menstrual period 07/17/2014, SpO2 100 %.Body mass index is 21.1 kg/(m^2).  General Appearance: Disheveled  Eye Contact::  Good  Speech:  Clear and Coherent  Volume:  Increased  Mood:  Hopeless  Affect:  Labile and Full Range  Thought Process:  Coherent  Orientation:  Full (Time, Place, and Person)  Thought Content:  Negative  Suicidal Thoughts:  No  Homicidal Thoughts:  No  Memory:  Immediate;   Good Recent;   Good Remote;   Good  Judgement:  Impaired  Insight:  Lacking  Psychomotor Activity:  Normal  Concentration:  Fair  Recall:  [employer] of Knowledge:Fair  Language: Good  Akathisia:  No  Handed:  Right  AIMS (if indicated):     Assets:  Communication Skills Resilience  ADL's:  Intact  Cognition: WNL  Sleep:      Medical Decision Making: Review of Psycho-Social Stressors (1), Review or order clinical lab tests (1), Established Problem, Worsening (2) and Review or order medicine tests (1)  Treatment Plan Summary: Plan Patient appears to be having something of an adjustment disorder to overwhelming stress. She is not  psychotic however and is not reporting suicidal ideation. She does feel hopeless about her situation and presents herself as having nowhere to go. Patient is abusing marijuana. Diagnosis will be adjustment disorder and cannabis abuse. Also pregnancy. I don't think that she requires inpatient hospitalization and certainly doesn't go to the top of the list for a psychiatric bed. Continue monitoring in the emergency room reevaluate after social work gets a chance to talk with her.  Patient has rested for a day in the emergency room. No new symptoms. Mood is more relaxed. No suicidal ideation no psychosis. No indication for psychiatric medicine. Patient plans to return to the homeless shelter. Encouraged to follow up with [facility] and with her outpatient provider of obstetrics services. Case discussed with emergency room doctor.  Plan:  Supportive therapy provided about ongoing  stressors. Disposition: Continue monitoring here no indication for change in any medication. Supportive therapy  Alethia Berthold 11/18/2014 5:52 PM

## 2014-11-18 NOTE — ED Notes (Signed)
Waiting for transport

## 2014-11-18 NOTE — ED Notes (Signed)
Pt to shower. Clean clothes provided. No further requests.

## 2014-11-18 NOTE — ED Notes (Signed)

## 2015-02-09 ENCOUNTER — Observation Stay
Admission: EM | Admit: 2015-02-09 | Discharge: 2015-02-10 | Disposition: A | Discharge status: Home / Self Care | Payer: Medicaid Other | Attending: Obstetrics and Gynecology | Admitting: Obstetrics and Gynecology

## 2015-02-09 DIAGNOSIS — O99320 Drug use complicating pregnancy, unspecified trimester: Secondary | ICD-10-CM

## 2015-02-09 DIAGNOSIS — O99323 Drug use complicating pregnancy, third trimester: Secondary | ICD-10-CM

## 2015-02-09 DIAGNOSIS — F1410 Cocaine abuse, uncomplicated: Secondary | ICD-10-CM | POA: Insufficient documentation

## 2015-02-09 DIAGNOSIS — O99330 Smoking (tobacco) complicating pregnancy, unspecified trimester: Secondary | ICD-10-CM

## 2015-02-09 DIAGNOSIS — O99333 Smoking (tobacco) complicating pregnancy, third trimester: Secondary | ICD-10-CM | POA: Insufficient documentation

## 2015-02-09 DIAGNOSIS — F1210 Cannabis abuse, uncomplicated: Secondary | ICD-10-CM | POA: Diagnosis not present

## 2015-02-09 DIAGNOSIS — Z3A28 28 weeks gestation of pregnancy: Secondary | ICD-10-CM | POA: Diagnosis not present

## 2015-02-09 DIAGNOSIS — F1910 Other psychoactive substance abuse, uncomplicated: Secondary | ICD-10-CM

## 2015-02-09 DIAGNOSIS — O4700 False labor before 37 completed weeks of gestation, unspecified trimester: Secondary | ICD-10-CM | POA: Diagnosis present

## 2015-02-09 HISTORY — DX: Other psychoactive substance abuse, uncomplicated: F19.10

## 2015-02-09 HISTORY — DX: Drug use complicating pregnancy, unspecified trimester: O99.320

## 2015-02-09 LAB — CBC
HCT: 34 % — ABNORMAL LOW (ref 35.0–47.0)
HEMOGLOBIN: 11.5 g/dL — AB (ref 12.0–16.0)
MCH: 32.6 pg (ref 26.0–34.0)
MCHC: 33.8 g/dL (ref 32.0–36.0)
MCV: 96.5 fL (ref 80.0–100.0)
PLATELETS: 245 10*3/uL (ref 150–440)
RBC: 3.53 MIL/uL — AB (ref 3.80–5.20)
RDW: 13.1 % (ref 11.5–14.5)
WBC: 19.5 10*3/uL — AB (ref 3.6–11.0)

## 2015-02-09 LAB — URINE DRUG SCREEN, QUALITATIVE (ARMC ONLY)
AMPHETAMINES, UR SCREEN: NOT DETECTED
BENZODIAZEPINE, UR SCRN: NOT DETECTED
Barbiturates, Ur Screen: NOT DETECTED
Cannabinoid 50 Ng, Ur ~~LOC~~: POSITIVE — AB
Cocaine Metabolite,Ur ~~LOC~~: POSITIVE — AB
MDMA (Ecstasy)Ur Screen: NOT DETECTED
METHADONE SCREEN, URINE: NOT DETECTED
Opiate, Ur Screen: NOT DETECTED
Phencyclidine (PCP) Ur S: NOT DETECTED
TRICYCLIC, UR SCREEN: NOT DETECTED

## 2015-02-09 LAB — URINALYSIS COMPLETE WITH MICROSCOPIC (ARMC ONLY)
BILIRUBIN URINE: NEGATIVE
GLUCOSE, UA: NEGATIVE mg/dL
Hgb urine dipstick: NEGATIVE
Ketones, ur: NEGATIVE mg/dL
Nitrite: NEGATIVE
Protein, ur: NEGATIVE mg/dL
Specific Gravity, Urine: 1.023 (ref 1.005–1.030)
pH: 6 (ref 5.0–8.0)

## 2015-02-09 LAB — TYPE AND SCREEN
ABO/RH(D): O POS
ANTIBODY SCREEN: NEGATIVE

## 2015-02-09 LAB — CHLAMYDIA/NGC RT PCR (ARMC ONLY)
CHLAMYDIA TR: NOT DETECTED
N gonorrhoeae: NOT DETECTED

## 2015-02-09 LAB — ABO/RH: ABO/RH(D): O POS

## 2015-02-09 MED ORDER — ACETAMINOPHEN 325 MG PO TABS
650.0000 mg | ORAL_TABLET | ORAL | Status: DC | PRN
  Administered 2015-02-09: 650 mg via ORAL
  Filled 2015-02-09: qty 2

## 2015-02-09 MED ORDER — BETAMETHASONE SOD PHOS & ACET 6 (3-3) MG/ML IJ SUSP
12.0000 mg | INTRAMUSCULAR | Status: DC
  Administered 2015-02-09: 12 mg via INTRAMUSCULAR
  Filled 2015-02-09: qty 2

## 2015-02-09 MED ORDER — PRENATAL MULTIVITAMIN CH
1.0000 | ORAL_TABLET | Freq: Every day | ORAL | Status: DC
  Administered 2015-02-10: 1 via ORAL
  Filled 2015-02-09: qty 1

## 2015-02-09 MED ORDER — CALCIUM CARBONATE ANTACID 500 MG PO CHEW
2.0000 | CHEWABLE_TABLET | ORAL | Status: DC | PRN
Start: 2015-02-09 — End: 2015-02-10

## 2015-02-09 MED ORDER — DOCUSATE SODIUM 100 MG PO CAPS
100.0000 mg | ORAL_CAPSULE | Freq: Every day | ORAL | Status: DC
  Administered 2015-02-10: 100 mg via ORAL
  Filled 2015-02-09: qty 1

## 2015-02-09 MED ORDER — LACTATED RINGERS IV SOLN
INTRAVENOUS | Status: DC
Start: 2015-02-09 — End: 2015-02-10
  Administered 2015-02-09: 1000 mL via INTRAVENOUS
  Administered 2015-02-09: 125 mL/h via INTRAVENOUS
  Administered 2015-02-10: 08:00:00 via INTRAVENOUS

## 2015-02-09 MED ORDER — ZOLPIDEM TARTRATE 5 MG PO TABS: 5.0000 mg | ORAL_TABLET | Freq: Every evening | ORAL | Status: DC | PRN

## 2015-02-09 NOTE — ED Notes (Signed)
Pt called in lobby without answer 

## 2015-02-09 NOTE — H&P (Signed)
ANTEPARTUM Observation Admission   Subjective: HPI: Pt. Brought by EMS tonight for c/o of preterm labor.  Pt. States she started feeling her contractions around 5:30pm.  She also stated she "could not feel the baby move as much".  Pt. Is a 23yo, African American female, [id number] with an unsure LMP and best EDD of 04/28/2015 by [place].  Pt. Receives care at [facility].    Obstetrical History: NSVD: 05/08/2008 at 37+4 weeks NSVD: 06/19/2010 at 36+4 weeks  ROS: General: no weight loss, weight gain, fatigue, fevers HEENT: no HA, visual disturbances Heart: no palpitations, chest pain Lungs: no SOB, no wheezing, no cough Abdomen: no n/v/c/d GU: no dysuria, VB, LOF  Allergies  Allergen Reactions  . Shellfish Allergy     Past Medical History  Diagnosis Date  . Asthma Depression with psych hospitalization visit with attempted suicide 11/16/2014 Hx: STD's - trichomonas, gonorrhea, chlamydia    No past surgical history on file.   Social History   Social History  . Marital Status: Single    Spouse Name: N/A  . Number of Children: N/A  . Years of Education: N/A   Occupational History  . Not on file.   Social History Main Topics  . Smoking status: Current Every Day Smoker    Types: Cigarettes  . Smokeless tobacco: Not on file  . Alcohol Use: No  . Drug Use: No  . Sexual Activity: Yes    Birth Control/ Protection: None   Other Topics Concern  . Not on file   Social History Narrative     Objective: Vital signs:  Filed Vitals:   02/09/15 2250  BP: 109/59  Pulse: 72  Temp:   Resp:    Labs:  Results for KALIMAH, CAPURRO (MRN [id number]) as of 02/10/2015 00:26  Ref. Range 02/09/2015 21:13  Appearance Latest Ref Range: CLEAR  HAZY (A)  Bacteria, UA Latest Ref Range: NONE SEEN  RARE (A)  Bilirubin Urine Latest Ref Range: NEGATIVE  NEGATIVE  Ca Oxalate Crys, UA Unknown PRESENT  Color, Urine Latest Ref Range: YELLOW  YELLOW (A)  Glucose Latest Ref Range: NEGATIVE mg/dL  NEGATIVE  Hgb urine dipstick Latest Ref Range: NEGATIVE  NEGATIVE  Ketones, ur Latest Ref Range: NEGATIVE mg/dL NEGATIVE  Leukocytes, UA Latest Ref Range: NEGATIVE  TRACE (A)  Mucous Unknown PRESENT  Nitrite Latest Ref Range: NEGATIVE  NEGATIVE  pH Latest Ref Range: 5.0-8.0  6.0  Protein Latest Ref Range: NEGATIVE mg/dL NEGATIVE  RBC / HPF Latest Ref Range: 0-5 RBC/hpf 0-5  Specific Gravity, Urine Latest Ref Range: 1.005-1.030  1.023  Squamous Epithelial / LPF Latest Ref Range: NONE SEEN  0-5 (A)  WBC, UA Latest Ref Range: 0-5 WBC/hpf 0-5  Amphetamines, Ur Screen Latest Ref Range: NONE DETECTED  NONE DETECTED  Barbiturates, Ur Screen Latest Ref Range: NONE DETECTED  NONE DETECTED  Benzodiazepine, Ur Scrn Latest Ref Range: NONE DETECTED  NONE DETECTED  Cocaine Metabolite,Ur [place] Latest Ref Range: NONE DETECTED  POSITIVE (A)  Methadone Scn, Ur Latest Ref Range: NONE DETECTED  NONE DETECTED  MDMA (Ecstasy)Ur Screen Latest Ref Range: NONE DETECTED  NONE DETECTED  Cannabinoid 50 Ng, Ur [place] Latest Ref Range: NONE DETECTED  POSITIVE (A)  Opiate, Ur Screen Latest Ref Range: NONE DETECTED  NONE DETECTED  Phencyclidine (PCP) Ur S Latest Ref Range: NONE DETECTED  NONE DETECTED  Tricyclic, Ur Screen Latest Ref Range: NONE DETECTED  NONE DETECTED   Physical exam:        General  appearance/behavior: alert, oriented x 4, anxious, rhythmic breathing       Heart: S1, S2, RRR       Lungs: clear, equal bilaterally       Abdomen: soft, non-tender, gravid       Extremities: no edema, no calf pain or tenderness        External genitalia: no lesions present, no erythema, no ecchymosis        Speculum examination: cervix appears closed, GBS and GC/CL cultures obtained, no lesions, no pooling, no vaginal bleeding       Bimanual exam / VE: 1.5cm/ 10%/-3/vtx.  [place]: reviewed from 11/2014 prior to examination showed posterior placenta        Fetal Assessment:  FHR: 140bmp/Moderate variability/+10x10 accels, no  decels TOCO: occasional uterine irritability   Assessment: [redacted] weeks gestation FHR category 1 Cocaine Abuse during pregnancy Cannabis Abuse during pregnancy Tobacco Abuse during pregnancy  Plan:  Observe overnight and recheck cervix in the AM BMZ 1 dose tonight and 1 tomorrow night Continuous monitoring IV hydration  Dr Dalbert Garnet updated with patient status / plan of care  Karena Addison, CNM   02/10/15 @ 0000  Checking in on patient and to discuss lab results Pt. States "I do use marijuana and the health department knows that, but I have never used cocaine during this pregnancy."  Pt. Was slightly lethargic at times, so we will readdress in the morning.  Currently, reporting minimal pain, but is sleeping throughout the conversation.   FHR: 140bmp/moderate variability/+10x10accels/ no decels TOCO: occ. UI  Continue close surveillance for vaginal bleeding, abdominal pain, s/s of abruption and preterm labor  Carlean Jews, CNM

## 2015-02-10 DIAGNOSIS — O99323 Drug use complicating pregnancy, third trimester: Secondary | ICD-10-CM

## 2015-02-10 DIAGNOSIS — F1410 Cocaine abuse, uncomplicated: Secondary | ICD-10-CM

## 2015-02-10 DIAGNOSIS — O99330 Smoking (tobacco) complicating pregnancy, unspecified trimester: Secondary | ICD-10-CM

## 2015-02-10 NOTE — Discharge Instructions (Signed)
Notify physician for:   Menstrual like cramps Leaking of fluid Bleeding Low, dull backache unrelieved by Tylenol Intestinal cramps, with or without diarrhea Uterine contractions. May feel painless and feel like the uterus is tightening or the baby is "balling up" Pelvic pressure General feeling that something is not right  No restrictions on diet or activity 

## 2015-02-10 NOTE — Progress Notes (Signed)
Pt tearful this morning about life stressors. States that she is homeless, has no help from friends, family or FOB. Uses Marijuana for stress relief and because she is depressed. Denies interest in antidepressant, fears it may harm the baby.

## 2015-02-10 NOTE — Discharge Summary (Signed)
Pt given d/c instructions, verbalizes understanding. States she will return at 10:30 PM for second BMZ injection if she has transportation. D/c waiting for transportation

## 2015-02-10 NOTE — Progress Notes (Signed)
   02/10/15 1230  Clinical Encounter Type  Visited With Patient  Visit Type Initial;Psychological support;Spiritual support  Referral From Nurse  Consult/Referral To Chaplain  Spiritual Encounters  Spiritual Needs Prayer;Emotional  Provided pastoral presence, support, prayer and emotional support to patient in Labor and Delivery.  [employer] [place]-pager [phone]

## 2015-02-10 NOTE — OB Triage Note (Signed)
Patient arrived by ambulance. Reported ctx since 12:30 today; increasing in strength. Midwife Sigmod to room for SSE. IV started and blood collected. Nursery called. Loyola Mast, RN

## 2015-02-10 NOTE — Progress Notes (Signed)
S: Resting comfortably overnight with no labor S/S. no LOF, no VB. Pt has slept well overnight O: Filed Vitals:   02/09/15 2250 02/10/15 0400 02/10/15 0402 02/10/15 0711  BP: 109/59 113/62 113/62 120/67  Pulse: 72 71 71 55  Temp:  97.9 F (36.6 C)  97.9 F (36.6 C)  TempSrc:    Oral  Resp:  16    Height:      Weight:        Gen: NAD, AAOx3      Abd: Gravid     Ext: Non-tender, Nonedmeatous    FHT:150 mod var + accelerations,  TOCO: no UC's SVE: not reevaluated   A/P:  24 y.o. yo [id number] at [redacted]w[redacted]d for Th PTL.  Labor:  Ruled out  FWB: Reassuring Cat 1 tracing.   GBS unknown  Social Services consult since pt is homeless and +cocaine and + MJ.    Sharee Pimple 9:35 AM

## 2015-02-10 NOTE — Clinical Social Work Note (Signed)
Clinical Social Work Assessment  Patient Details  Name: Casey Owens MRN: [id number] Date of Birth: 1991-05-17  Date of referral:  02/10/15               Reason for consult:  Housing Concerns/Homelessness, Mental Health Concerns, Substance Use/ETOH Abuse, Family Concerns, [employer], Financial Concerns                Permission sought to share information with:  Other Lanae Boast from [employer]) Permission granted to share information::  Yes, Verbal Permission Granted  Name::      Management consultant::   [facility]  Relationship::     Contact Information:     Housing/Transportation Living arrangements for the past 2 months:  Single Family Home Source of Information:  Patient Patient Interpreter Needed:  None Criminal Activity/Legal Involvement Pertinent to Current Situation/Hospitalization:  No - Comment as needed Significant Relationships:  Warehouse manager, Dependent Children, Friend, Other Family Members Lives with:  Minor Children, Friends, Roommate Do you feel safe going back to the place where you live?  Yes Need for family participation in patient care:  Yes (Comment)  Care giving concerns: Patient lives with her friend Teacher, adult education in [place].    Facilities manager / plan: Holiday representative ([place]) received consult that patient is homeless and is positive for cocaine and marijuana. CSW met with patient alone at bedside. CSW introduced self and explained role of CSW department. Patient was laying in bed and answered questions appropriately. Patient reported that she is pregnant with her third child. Per patient she has a 26 year old daughter Timoteo Gaul and a 57 year old son Molli Hazard. Patient reported that her 57 year old daughter was taken by her father. Patient reported that the father took out a 35 B on patient and is taking patient to court to gain full custody of the 26 year old. Patient's 18 year old son is living with her at a friend's house at [address],  [place], [place] [address]. Patient explained that her mother kicked her out of her house in [place] when her mother moved in with her boyfriend. Patient reported that she lives in [place] with her fiance's sister for a while and then moved to [place] with her friend Teacher, adult education. Claressa has 4 children in the home as well as patient and her son. Patient reported that she was working on [employer] at [employer] but lost her job due the stress of being homeless and pregnant. Patient stated that she grew up in the [place] area and received a full track scholarship to [employer] but dropped out in 11th grade. Patient received her GED at [facility]. Patient reported that she has an interview at [employer] in [place] next week.   CSW discussed the positive marijuana and cocaine drug screen with patient. Patient denied cocaine use and reported that she uses marijuana occasionally but not every day. Patient reported that she doesn't use marijuana while her child or other children are under her supervision. Patient became tearful and reported that she would like to get help for substance abuse. Patient gave CSW permission to call Lanae Boast from [facility]. CSW provided patient with community recourses in [place]. Patient reported that she has medicaid, wic and food stamps. CSW provided emotional support and encouragement. Please reconsult if future social work needs arise. CSW singing off.    Employment status:  Temporary, Unemployed Insurance information:  Medicaid [place] PT Recommendations:  Not assessed at this time Information / Referral to community resources:  Outpatient Substance Abuse Treatment Options  Patient/Family's Response to care: Patient is agreeable to starting services at [facility]. CSW left a Advertising account executive with Lanae Boast from [employer].   Patient/Family's Understanding of and Emotional Response to Diagnosis, Current Treatment, and Prognosis: Patient was pleasant throughout assessment and appeared  hopefull. Patient thanked CSW for visit and providing resources.   Emotional Assessment Appearance:  Appears stated age Attitude/Demeanor/Rapport:    Affect (typically observed):  Accepting, Adaptable, Appropriate, Calm, Pleasant, Sad, Tearful/Crying Orientation:  Oriented to Self, Oriented to Place, Oriented to  Time, Oriented to Situation Alcohol / Substance use:  Illicit Drugs Psych involvement (Current and /or in the community):  No (Comment)  Discharge Needs  Concerns to be addressed:  Financial / Insurance Concerns Readmission within the last 30 days:  No Current discharge risk:  Inadequate Financial Supports Barriers to Discharge:  No Barriers Identified   Elwyn Reach 02/10/2015, 7:19 PM

## 2015-02-12 LAB — CULTURE, BETA STREP (GROUP B ONLY)

## 2015-03-17 ENCOUNTER — Observation Stay
Admission: EM | Admit: 2015-03-17 | Discharge: 2015-03-18 | Disposition: A | Discharge status: Home / Self Care | Payer: Medicaid Other | Attending: Obstetrics and Gynecology | Admitting: Obstetrics and Gynecology

## 2015-03-17 DIAGNOSIS — Z590 Homelessness: Secondary | ICD-10-CM | POA: Insufficient documentation

## 2015-03-17 DIAGNOSIS — O365930 Maternal care for other known or suspected poor fetal growth, third trimester, not applicable or unspecified: Secondary | ICD-10-CM | POA: Diagnosis not present

## 2015-03-17 DIAGNOSIS — R109 Unspecified abdominal pain: Secondary | ICD-10-CM | POA: Diagnosis present

## 2015-03-17 DIAGNOSIS — J45909 Unspecified asthma, uncomplicated: Secondary | ICD-10-CM | POA: Diagnosis not present

## 2015-03-17 DIAGNOSIS — F17200 Nicotine dependence, unspecified, uncomplicated: Secondary | ICD-10-CM | POA: Diagnosis not present

## 2015-03-17 DIAGNOSIS — F329 Major depressive disorder, single episode, unspecified: Secondary | ICD-10-CM | POA: Insufficient documentation

## 2015-03-17 DIAGNOSIS — Z3A33 33 weeks gestation of pregnancy: Secondary | ICD-10-CM | POA: Insufficient documentation

## 2015-03-17 DIAGNOSIS — IMO0002 Reserved for concepts with insufficient information to code with codable children: Secondary | ICD-10-CM

## 2015-03-17 LAB — URINE DRUG SCREEN, QUALITATIVE (ARMC ONLY)
AMPHETAMINES, UR SCREEN: NOT DETECTED
BENZODIAZEPINE, UR SCRN: NOT DETECTED
Barbiturates, Ur Screen: NOT DETECTED
CANNABINOID 50 NG, UR ~~LOC~~: POSITIVE — AB
Cocaine Metabolite,Ur ~~LOC~~: NOT DETECTED
MDMA (Ecstasy)Ur Screen: NOT DETECTED
Methadone Scn, Ur: NOT DETECTED
Opiate, Ur Screen: NOT DETECTED
PHENCYCLIDINE (PCP) UR S: NOT DETECTED
TRICYCLIC, UR SCREEN: NOT DETECTED

## 2015-03-17 LAB — COMPREHENSIVE METABOLIC PANEL
ALBUMIN: 2.6 g/dL — AB (ref 3.5–5.0)
ALK PHOS: 95 U/L (ref 38–126)
ALT: 20 U/L (ref 14–54)
ANION GAP: 5 (ref 5–15)
AST: 22 U/L (ref 15–41)
BILIRUBIN TOTAL: 0.6 mg/dL (ref 0.3–1.2)
BUN: 6 mg/dL (ref 6–20)
CALCIUM: 8.8 mg/dL — AB (ref 8.9–10.3)
CO2: 23 mmol/L (ref 22–32)
Chloride: 109 mmol/L (ref 101–111)
Creatinine, Ser: 0.58 mg/dL (ref 0.44–1.00)
GFR calc Af Amer: >60 mL/min (ref >60)
GLUCOSE: 70 mg/dL (ref 65–99)
Potassium: 3.8 mmol/L (ref 3.5–5.1)
Sodium: 137 mmol/L (ref 135–145)
TOTAL PROTEIN: 6.3 g/dL — AB (ref 6.5–8.1)

## 2015-03-17 LAB — RAPID HIV SCREEN (HIV 1/2 AB+AG)
HIV 1/2 ANTIBODIES: NONREACTIVE
HIV-1 P24 ANTIGEN - HIV24: NONREACTIVE

## 2015-03-17 LAB — URINALYSIS COMPLETE WITH MICROSCOPIC (ARMC ONLY)
BILIRUBIN URINE: NEGATIVE
GLUCOSE, UA: NEGATIVE mg/dL
HGB URINE DIPSTICK: NEGATIVE
LEUKOCYTES UA: NEGATIVE
NITRITE: NEGATIVE
PH: 6 (ref 5.0–8.0)
Protein, ur: NEGATIVE mg/dL
SPECIFIC GRAVITY, URINE: 1.019 (ref 1.005–1.030)

## 2015-03-17 LAB — CBC
HEMATOCRIT: 34.1 % — AB (ref 35.0–47.0)
Hemoglobin: 11.6 g/dL — ABNORMAL LOW (ref 12.0–16.0)
MCH: 32.6 pg (ref 26.0–34.0)
MCHC: 34.1 g/dL (ref 32.0–36.0)
MCV: 95.7 fL (ref 80.0–100.0)
PLATELETS: 253 10*3/uL (ref 150–440)
RBC: 3.57 MIL/uL — AB (ref 3.80–5.20)
RDW: 13 % (ref 11.5–14.5)
WBC: 20.5 10*3/uL — AB (ref 3.6–11.0)

## 2015-03-17 LAB — FETAL FIBRONECTIN: FETAL FIBRONECTIN: NEGATIVE

## 2015-03-17 LAB — KLEIHAUER-BETKE STAIN
Fetal Cells %: 0 %
Quantitation Fetal Hemoglobin: 0 mL

## 2015-03-17 LAB — FIBRINOGEN: Fibrinogen: 549 mg/dL — ABNORMAL HIGH (ref 210–470)

## 2015-03-17 LAB — OB RESULTS CONSOLE GBS: STREP GROUP B AG: NEGATIVE

## 2015-03-17 LAB — OB RESULTS CONSOLE HIV ANTIBODY (ROUTINE TESTING): HIV: NONREACTIVE

## 2015-03-17 MED ORDER — OXYCODONE-ACETAMINOPHEN 5-325 MG PO TABS: 2.0000 | ORAL_TABLET | ORAL | Status: DC | PRN

## 2015-03-17 MED ORDER — NIFEDIPINE 10 MG PO CAPS
30.0000 mg | ORAL_CAPSULE | Freq: Once | ORAL | Status: AC
  Administered 2015-03-17: 30 mg via ORAL
  Filled 2015-03-17 (×2): qty 3

## 2015-03-17 MED ORDER — SODIUM CHLORIDE 0.9 % IV SOLN
INTRAVENOUS | Status: AC
  Administered 2015-03-17: 2 g via INTRAVENOUS
  Filled 2015-03-17: qty 2000

## 2015-03-17 MED ORDER — ACETAMINOPHEN 500 MG PO TABS
1000.0000 mg | ORAL_TABLET | ORAL | Status: DC | PRN
  Administered 2015-03-17: 1000 mg via ORAL
  Filled 2015-03-17: qty 2

## 2015-03-17 MED ORDER — ZOLPIDEM TARTRATE 5 MG PO TABS: 5.0000 mg | ORAL_TABLET | Freq: Every evening | ORAL | Status: DC | PRN

## 2015-03-17 MED ORDER — BETAMETHASONE SOD PHOS & ACET 6 (3-3) MG/ML IJ SUSP
12.0000 mg | Freq: Every day | INTRAMUSCULAR | Status: AC
  Administered 2015-03-17 – 2015-03-18 (×2): 12 mg via INTRAMUSCULAR
  Filled 2015-03-17: qty 2

## 2015-03-17 MED ORDER — NIFEDIPINE 10 MG PO CAPS
10.0000 mg | ORAL_CAPSULE | Freq: Four times a day (QID) | ORAL | Status: DC
  Filled 2015-03-17 (×4): qty 1

## 2015-03-17 MED ORDER — AMPICILLIN SODIUM 2 G IJ SOLR
2.0000 g | Freq: Once | INTRAMUSCULAR | Status: AC
  Administered 2015-03-17: 2 g via INTRAVENOUS
  Filled 2015-03-17: qty 2000

## 2015-03-17 MED ORDER — LACTATED RINGERS IV BOLUS (SEPSIS)
1000.0000 mL | Freq: Once | INTRAVENOUS | Status: AC
  Administered 2015-03-17: 1000 mL via INTRAVENOUS

## 2015-03-17 MED ORDER — NIFEDIPINE 10 MG PO CAPS
10.0000 mg | ORAL_CAPSULE | Freq: Four times a day (QID) | ORAL | Status: DC
  Administered 2015-03-17 – 2015-03-18 (×4): 10 mg via ORAL
  Filled 2015-03-17 (×9): qty 1

## 2015-03-17 MED ORDER — ONDANSETRON HCL 4 MG/2ML IJ SOLN: 4.0000 mg | Freq: Four times a day (QID) | INTRAMUSCULAR | Status: DC | PRN

## 2015-03-17 MED ORDER — SODIUM CHLORIDE 0.9 % IJ SOLN
INTRAMUSCULAR | Status: AC
  Filled 2015-03-17: qty 3

## 2015-03-17 MED ORDER — SODIUM CHLORIDE 0.9 % IV SOLN
1.0000 g | Freq: Three times a day (TID) | INTRAVENOUS | Status: DC
  Administered 2015-03-18: 1 g via INTRAVENOUS

## 2015-03-17 MED ORDER — CITRIC ACID-SODIUM CITRATE 334-500 MG/5ML PO SOLN: 30.0000 mL | ORAL | Status: DC | PRN

## 2015-03-17 MED ORDER — LACTATED RINGERS IV SOLN
INTRAVENOUS | Status: DC
  Administered 2015-03-17: 14:00:00 via INTRAVENOUS

## 2015-03-17 MED ORDER — OXYCODONE-ACETAMINOPHEN 5-325 MG PO TABS: 1.0000 | ORAL_TABLET | ORAL | Status: DC | PRN

## 2015-03-17 NOTE — OB Triage Note (Signed)
Patient arrived via EMS. Patient states she has been contracting since 0400. Denies any bleeding, discharge or LOF. States baby is moving well. Pt states she has been vomiting and had diarrhea for the past 2 days.

## 2015-03-17 NOTE — Progress Notes (Signed)
Pt called and expressed drastic sudden increase in pain. Upon assessment, pt hyperventalating, abdomen firm, does relax slightly in between contractions. Pt crying in pain and balled up in bed. SVE performed no cervical change, Dr Tildon HuskyHalfon informed, plans to obtain further labs, and do [place]

## 2015-03-17 NOTE — H&P (Signed)
ANTEPARTUM Observation Admission   LMP: 07/17/14 EDD: 04/29/2015 by 12 wk sono  Subjective:  24yo, African American female, [id number] @ 33+4 here for severe abdominal pain, ctx. She says pain 10/10.   Denies vaginal bleeding, f/c. Endorses vaginal discharge, no itching, +smell. +diarrhea and vomiting for 2 days.  [facility]; [facility] 1. MJ use in pregnancy 2. Smoking in pregnancy (1/2 pack/day down from a pack/day) 3. Social disarray - homeless, children with her mother  4. Preterm delivery @ 34wks (per patient) - received 1 dose of 17 OHP this pregnancy, too painful for her to continue 5. H/o preterm ctx this pregnancy  - s/p BMZ x 1 dose 02/09/15 - did not return for her second dose  Obstetrical History: NSVD: 05/08/2008 at 37+4 weeks NSVD: 06/19/2010 at 34 weeks  ROS: General: no weight loss, weight gain, fatigue, fevers HEENT: no HA, visual disturbances Heart: no palpitations, chest pain Lungs: no SOB, no wheezing, no cough Abdomen: no n/v/c/d GU: no dysuria, VB, LOF  Feels safe at home, no h/o abuse.   Allergies  Allergen Reactions  . Shellfish Allergy     Past Medical History  Diagnosis Date  . Asthma Depression with psych hospitalization visit with attempted suicide 11/16/2014 Hx: STD's - trichomonas, gonorrhea, chlamydia    No past surgical history on file.   Social History   Social History  . Marital Status: Single    Spouse Name: N/A  . Number of Children: N/A  . Years of Education: N/A   Occupational History  . Not on file.   Social History Main Topics  . Smoking status: Current Every Day Smoker -- 0.25 packs/day    Types: Cigarettes  . Smokeless tobacco: Never Used  . Alcohol Use: No  . Drug Use: Yes    Special: Cocaine, Marijuana     Comment: Patient denies using cocaine.  . Sexual Activity: Yes    Birth Control/ Protection: None   Other Topics Concern  . Not on file   Social History Narrative     Objective: Vital signs:  Filed Vitals:   03/17/15 1812  BP: 108/48  Pulse: 54  Temp: 98.1 F (36.7 C)  Resp: 18   General appearance: distracted, fatigued, mild distress and anxious affect Head: Normocephalic, without obvious abnormality, atraumatic Eyes: conjunctivae/corneas clear. PERRL, EOM's intact. Fundi benign. Abdomen: soft, tender to palpation, no rebound, no guarding Pelvic: external genitalia normal, no bladder tenderness, rectovaginal septum normal, urethra without abnormality or discharge and curd-like discharge and very tender to palpation Extremities: extremities normal, atraumatic, no cyanosis or edema Skin: Skin color, texture, turgor normal. No rashes or lesions   SVE: 3/50/high x 2 exams over 4 hours apart  EFM: 140 mod var, +accels, no decels Toco: Q59min   Labs: CMP Latest Ref Rng 03/17/2015 11/16/2014 08/19/2013  Glucose 65 - 99 mg/dL 70 90 69  BUN 6 - 20 mg/dL $Remove'6 10 7  'ffdUQIx$ Creatinine 0.44 - 1.00 mg/dL 0.58 0.69 0.98  Sodium 135 - 145 mmol/L 137 135 139  Potassium 3.5 - 5.1 mmol/L 3.8 3.7 3.7  Chloride 101 - 111 mmol/L 109 104 107  CO2 22 - 32 mmol/L $RemoveB'23 23 29  'QJcjKdDR$ Calcium 8.9 - 10.3 mg/dL 8.8(L) 9.2 9.1  Total Protein 6.5 - 8.1 g/dL 6.3(L) 7.7 7.4  Total Bilirubin 0.3 - 1.2 mg/dL 0.6 0.7 1.1(H)  Alkaline Phos 38 - 126 U/L 95 73 85  AST 15 - 41 U/L $Remo'22 18 16  'lErWS$ ALT 14 - 54 U/L 20 13(L)  21   Drugs of Abuse     Component Value Date/Time   LABOPIA NONE DETECTED 03/17/2015 1358   LABBENZ NONE DETECTED 03/17/2015 1358   AMPHETMU NONE DETECTED 03/17/2015 1358   THCU POSITIVE* 03/17/2015 1358   LABBARB NONE DETECTED 03/17/2015 1358    Urinalysis    Component Value Date/Time   COLORURINE YELLOW* 03/17/2015 1358   COLORURINE Yellow 08/19/2013 1448   APPEARANCEUR CLEAR* 03/17/2015 1358   APPEARANCEUR Cloudy 08/19/2013 1448   LABSPEC 1.019 03/17/2015 1358   LABSPEC 1.018 08/19/2013 1448   PHURINE 6.0 03/17/2015 1358   PHURINE 6.0 08/19/2013 1448   GLUCOSEU NEGATIVE 03/17/2015 1358   GLUCOSEU Negative  08/19/2013 1448   HGBUR NEGATIVE 03/17/2015 1358   HGBUR Negative 08/19/2013 1448   BILIRUBINUR NEGATIVE 03/17/2015 1358   BILIRUBINUR Negative 08/19/2013 1448   KETONESUR TRACE* 03/17/2015 1358   KETONESUR Negative 08/19/2013 [place] 03/17/2015 1358   PROTEINUR Negative 08/19/2013 1448   UROBILINOGEN 0.2 08/31/2014 0950   NITRITE NEGATIVE 03/17/2015 1358   NITRITE Negative 08/19/2013 1448   LEUKOCYTESUR NEGATIVE 03/17/2015 1358   LEUKOCYTESUR Trace 08/19/2013 1448    GBS neg 02/09/15 Wet prep pending GC/CT pending  TAS: vtx, AFI 12, active fetus, fundal placenta, no retroplacental separation or hemorrhage appreciated    Assessment: 24yo  [id number] @ 33+4 w/ h/o prior 34wk delivery (per patient(, h/o BMZ injection x 1 on 02/09/15 for PTC, and here again with PTC likely due to dehydration. Persistent pain concerning for progressing labor versus concealed abruption.   Plan:  Observe overnight and recheck cervix in the AM BMZ 1 dose tonight and 1 tomorrow night Continuous monitoring IV hydration Growth sono tomorrow as current smoker and no sonograms since dating at 12wks Nifedipine for tocolysis GBS neg within 6 wks, no need for Amp (will d/c)  Lorette Ang, MD

## 2015-03-18 ENCOUNTER — Observation Stay: Payer: Medicaid Other

## 2015-03-18 DIAGNOSIS — O365930 Maternal care for other known or suspected poor fetal growth, third trimester, not applicable or unspecified: Secondary | ICD-10-CM | POA: Diagnosis not present

## 2015-03-18 LAB — OB RESULTS CONSOLE GC/CHLAMYDIA
Chlamydia: NEGATIVE
Gonorrhea: NEGATIVE

## 2015-03-18 LAB — CHLAMYDIA/NGC RT PCR (ARMC ONLY)
CHLAMYDIA TR: NOT DETECTED
N GONORRHOEAE: NOT DETECTED

## 2015-03-18 MED ORDER — SODIUM CHLORIDE 0.9 % IV SOLN
INTRAVENOUS | Status: AC
  Administered 2015-03-18: 1 g via INTRAVENOUS
  Filled 2015-03-18: qty 1000

## 2015-03-18 NOTE — Discharge Instructions (Signed)
You were admitted overnight for Preterm Labor. We gave you betamethasone x 2 shots for fetal lung maturity in the event that you delivered. We stopped your contractions with nifedipine for 24hrs. As you have not changed your cervix and have stopped contracting, you are stable for discharge home with strict preterm labor precautions.   If vaginal bleeding like a light period, [facility]. If contractions every 5 minutes for 2 hours, return to birthplace Please follow up at [facility]   Preterm Labor Information Preterm labor is when labor starts at less than 37 weeks of pregnancy. The normal length of a pregnancy is 39 to 41 weeks. CAUSES Often, there is no identifiable underlying cause as to why a woman goes into preterm labor. One of the most common known causes of preterm labor is infection. Infections of the uterus, cervix, vagina, amniotic sac, bladder, kidney, or even the lungs (pneumonia) can cause labor to start. Other suspected causes of preterm labor include:   Urogenital infections, such as yeast infections and bacterial vaginosis.   Uterine abnormalities (uterine shape, uterine septum, fibroids, or bleeding from the placenta).   A cervix that has been operated on (it may fail to stay closed).   Malformations in the fetus.   Multiple gestations (twins, triplets, and so on).   Breakage of the amniotic sac.  RISK FACTORS  Having a previous history of preterm labor.   Having premature rupture of membranes (PROM).   Having a placenta that covers the opening of the cervix (placenta previa).   Having a placenta that separates from the uterus (placental abruption).   Having a cervix that is too weak to hold the fetus in the uterus (incompetent cervix).   Having too much fluid in the amniotic sac (polyhydramnios).   Taking illegal drugs or smoking while pregnant.   Not gaining enough weight while pregnant.   Being younger than 3718 and older than 26 years old.    Having a low socioeconomic status.   Being African American. SYMPTOMS Signs and symptoms of preterm labor include:   Menstrual-like cramps, abdominal pain, or back pain.  Uterine contractions that are regular, as frequent as six in an hour, regardless of their intensity (may be mild or painful).  Contractions that start on the top of the uterus and spread down to the lower abdomen and back.   A sense of increased pelvic pressure.   A watery or bloody mucus discharge that comes from the vagina.  TREATMENT Depending on the length of the pregnancy and other circumstances, your health care provider may suggest bed rest. If necessary, there are medicines that can be given to stop contractions and to mature the fetal lungs. If labor happens before 34 weeks of pregnancy, a prolonged hospital stay may be recommended. Treatment depends on the condition of both you and the fetus.  WHAT SHOULD YOU DO IF YOU THINK YOU ARE IN PRETERM LABOR? Call your health care provider right away. You will need to go to the hospital to get checked immediately. HOW CAN YOU PREVENT PRETERM LABOR IN FUTURE PREGNANCIES? You should:   Stop smoking if you smoke.  Maintain healthy weight gain and avoid chemicals and drugs that are not necessary.  Be watchful for any type of infection.  Inform your health care provider if you have a known history of preterm labor.   This information is not intended to replace advice given to you by your health care provider. Make sure you discuss any questions you  have with your health care provider.   Document Released: 08/03/2003 Document Revised: 01/13/2013 Document Reviewed: 06/15/2012 Elsevier Interactive Patient Education 2016 Elsevier Inc.  Fetal Movement Counts  Performing a fetal movement count is highly recommended in high-risk pregnancies, but it is good for every pregnant woman to do. Your health care provider may ask you to start counting fetal movements at  28 weeks of the pregnancy. Fetal movements often increase:  After eating a full meal.  After physical activity.  After eating or drinking something sweet or cold.  At rest. Pay attention to when you feel the baby is most active. This will help you notice a pattern of your baby's sleep and wake cycles and what factors contribute to an increase in fetal movement. It is important to perform a fetal movement count at the same time each day when your baby is normally most active.  HOW TO COUNT FETAL MOVEMENTS  Find a quiet and comfortable area to sit or lie down on your left side. Lying on your left side provides the best blood and oxygen circulation to your baby.  Write down the day and time on a sheet of paper or in a journal.  Start counting kicks, flutters, swishes, rolls, or jabs in a 2-hour period. You should feel at least 10 movements within 2 hours.  If you do not feel 10 movements in 2 hours, wait 2-3 hours and count again. Look for a change in the pattern or not enough counts in 2 hours. SEEK MEDICAL CARE IF:  You feel less than 10 counts in 2 hours, tried twice.  There is no movement in over an hour.  The pattern is changing or taking longer each day to reach 10 counts in 2 hours. You feel the baby is not moving as he or she usually does.This information is not intended to replace advice given to you by your health care provider. Make sure you discuss any questions you have with your health care provider.   Document Released: 06/12/2006 Document Revised: 06/03/2014 Document Reviewed: 03/09/2012 Elsevier Interactive Patient Education [employer]

## 2015-03-18 NOTE — Discharge Summary (Signed)
Obstetric Observation Discharge Summary Reason for Admission: preterm contractions Prenatal Procedures: none  Hospital course: Pt was placed in observation yesterday due to preterm contractions with cervical change. She was ruled out for abruption with continuous monitoring and labs. Her contractions were stopped with nifedipine, she was given a rescue course of BMZ x 2. Amp was not needed as she tested neg for GBS 02/09/15. Her infection workup was negative.   CBC Latest Ref Rng 03/17/2015 02/09/2015 11/16/2014  WBC 3.6 - 11.0 K/uL 20.5(H) 19.5(H) 18.7(H)  Hemoglobin 12.0 - 16.0 g/dL 11.6(L) 11.5(L) 13.8  Hematocrit 35.0 - 47.0 % 34.1(L) 34.0(L) 41.6  Platelets 150 - 440 K/uL 253 245 263    GC/CT neg Fibrinogen 549 KB 0  Utox + THC  Physical Exam:  General: alert, cooperative and appears stated age ABD: soft, NT  SVE: 2-3 /long/post (same exam as yesterday)  TAS: Biometry c/w 33wks; AFI 11, VTX, normal anatomy  Discharge Diagnoses: False labor-undelivered; threatened preterm labor  Discharge Information: Date: 03/18/2015 Activity: pelvic rest Diet: routine Medications: None Condition: stable Instructions: refer to practice specific booklet Discharge to: home Follow-up Information    Follow up with [facility] In 9 days.   Contact information:   [address] [place] [id number] [phone]       Ala DachJohanna K Emmilee Owens 03/18/2015, 2:42 PM

## 2015-03-18 NOTE — Progress Notes (Signed)
Reports having been sick 2 days ago, but it is better now.  Two days ago had abdominal pain in her epigastric area that radiated across to her LUQ and RUQ of abdomen.  Denies any radiation of pain to back or lower abdomen.  Pain was a 5/10 at time and she had nausea, vomiting, and diarrhea with it.  She had a few episodes where she vomited bile and food.  From Thursday to Friday she was unable to eat or drink very much.  Denies any fever.  Did have some fatigue and myalgia.  Eating made it worse and resting made it better.   She did not try anything at home to make it better. Last night and this morning she denies any nausea, diarrhea, or vomiting.  She feels a lot better.  On assessment noted mild abdominal tenderness with palpation of RUQ, but negative Murphy sign.  Mild tenderness to LUQ and suprapubic area with palpation
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
## 2015-03-18 NOTE — Clinical Social Work Note (Signed)
Clinical Social Work Assessment  Patient Details  Name: Casey MinusDmtrieve S Umeda MRN: [id number] Date of Birth: 02/11/1991  Date of referral:  03/18/15               Reason for consult:  Housing Concerns/Homelessness, Rule-out Psychosocial                Permission sought to share information with:    Permission granted to share information::     Name::        Agency::     Relationship::     Contact Information:     Housing/Transportation Living arrangements for the past 2 months:  Homeless Shelter, No permanent address (living with a friend) Source of Information:  Patient Patient Interpreter Needed:  None Criminal Activity/Legal Involvement Pertinent to Current Situation/Hospitalization:  No - Comment as needed Significant Relationships:  Dependent Children, Significant Other, Mental Health Provider Lives with:  Friends Do you feel safe going back to the place where you live?  Yes Need for family participation in patient care:  No (Coment)  Care giving concerns:     Office managerocial Worker assessment / plan:  Clinical Social Work consult for psychosocial concerns.  Patient is currently living with a friend.  This is her 3rd child.  Support system consist of her baby's father and her mental health peer support with [facility].  Per patient she is in the process of getter a care coordinator.   Peer support takes her to appointments.  She has no income at this time, just started receiving Work First Benefits but hasn't received her first check.  Patient has also been approved for a DayCare voucher for her 26-year-old son and receives Media plannerMedicaid Transportation for her medical appointments.     CSW discussed other needs.  Per patient she has a car seat for the baby and has been gathering clothes and other things. Patient has been in and out of shelters, she is very resourceful of community resources in [place] and the surrounding area.  Discuss all resources with patient, informed CSW that she  has tried and utilized them all.  Went on to explain why each did not work for her.    CSW encourage patient to continue to work with her peer support and care coordinator so they could assister her with housing.  Also informed patient to obtain a work note for  her Work Set designerirst Worker to show that she was in the hospital as patient was concerned about not obtaining all of her credit hours this months.    Patient did not want in resource information from CSW and will continue to work on obtaining stable housing.   Employment status:  Unemployed (started receiveing Work First benefits) Health and safety inspectornsurance information:  [place]icaid [place] Recommendations:    Information / Referral to community resources:   (to continue to follow up with mental health provider [facility])  Patient/Family's Response to care:  Patient was appreciative of talking with CSW  Patient/Family's Understanding of and Emotional Response to Diagnosis, Current Treatment, and Prognosis:  Patient understand she will be discharged when medically cleared and will return to her friends home.  Emotional Assessment Appearance:  Appears stated age Attitude/Demeanor/Rapport:    Affect (typically observed):  Adaptable, Hopeful, Overwhelmed, Pleasant, Frustrated Orientation:  Oriented to Self, Oriented to Place, Oriented to  Time, Oriented to Situation Alcohol / Substance use:    Psych involvement (Current and /or in the community):  Outpatient Provider [facility] )  Discharge Needs  Concerns  to be addressed:  Homelessness, Coping/Stress Concerns Readmission within the last 30 days:  No Current discharge risk:  Homeless, Lack of support system Barriers to Discharge:  No Barriers Identified   Soundra Pilon, LCSW 03/18/2015, 2:59 PM

## 2015-03-19 LAB — CULTURE, BETA STREP (GROUP B ONLY)

## 2015-03-23 ENCOUNTER — Inpatient Hospital Stay
Admission: EM | Admit: 2015-03-23 | Discharge: 2015-03-26 | DRG: 775 | Disposition: A | Discharge status: Home / Self Care | Payer: Medicaid Other | Attending: Obstetrics and Gynecology | Admitting: Obstetrics and Gynecology

## 2015-03-23 ENCOUNTER — Encounter: Payer: Self-pay | Admitting: *Deleted

## 2015-03-23 DIAGNOSIS — O9989 Other specified diseases and conditions complicating pregnancy, childbirth and the puerperium: Secondary | ICD-10-CM | POA: Diagnosis present

## 2015-03-23 DIAGNOSIS — O9902 Anemia complicating childbirth: Secondary | ICD-10-CM | POA: Diagnosis present

## 2015-03-23 DIAGNOSIS — D649 Anemia, unspecified: Secondary | ICD-10-CM | POA: Diagnosis present

## 2015-03-23 DIAGNOSIS — J45909 Unspecified asthma, uncomplicated: Secondary | ICD-10-CM | POA: Diagnosis present

## 2015-03-23 DIAGNOSIS — O42913 Preterm premature rupture of membranes, unspecified as to length of time between rupture and onset of labor, third trimester: Secondary | ICD-10-CM | POA: Diagnosis present

## 2015-03-23 DIAGNOSIS — O99324 Drug use complicating childbirth: Secondary | ICD-10-CM | POA: Diagnosis present

## 2015-03-23 DIAGNOSIS — O9952 Diseases of the respiratory system complicating childbirth: Secondary | ICD-10-CM | POA: Diagnosis present

## 2015-03-23 DIAGNOSIS — O99334 Smoking (tobacco) complicating childbirth: Secondary | ICD-10-CM | POA: Diagnosis present

## 2015-03-23 DIAGNOSIS — F1210 Cannabis abuse, uncomplicated: Secondary | ICD-10-CM | POA: Diagnosis present

## 2015-03-23 DIAGNOSIS — R001 Bradycardia, unspecified: Secondary | ICD-10-CM | POA: Diagnosis not present

## 2015-03-23 DIAGNOSIS — Z3A34 34 weeks gestation of pregnancy: Secondary | ICD-10-CM | POA: Diagnosis not present

## 2015-03-23 DIAGNOSIS — F1410 Cocaine abuse, uncomplicated: Secondary | ICD-10-CM | POA: Diagnosis present

## 2015-03-23 LAB — TYPE AND SCREEN
ABO/RH(D): O POS
Antibody Screen: NEGATIVE

## 2015-03-23 LAB — CBC
HCT: 34.7 % — ABNORMAL LOW (ref 35.0–47.0)
Hemoglobin: 11.9 g/dL — ABNORMAL LOW (ref 12.0–16.0)
MCH: 32.8 pg (ref 26.0–34.0)
MCHC: 34.2 g/dL (ref 32.0–36.0)
MCV: 95.8 fL (ref 80.0–100.0)
PLATELETS: 279 10*3/uL (ref 150–440)
RBC: 3.63 MIL/uL — AB (ref 3.80–5.20)
RDW: 12.7 % (ref 11.5–14.5)
WBC: 15.5 10*3/uL — ABNORMAL HIGH (ref 3.6–11.0)

## 2015-03-23 MED ORDER — BUTORPHANOL TARTRATE 1 MG/ML IJ SOLN: 1.0000 mg | INTRAMUSCULAR | Status: DC | PRN

## 2015-03-23 MED ORDER — BETAMETHASONE SOD PHOS & ACET 6 (3-3) MG/ML IJ SUSP: 12.0000 mg | Freq: Once | INTRAMUSCULAR | Status: DC

## 2015-03-23 MED ORDER — AMPICILLIN SODIUM 2 G IJ SOLR
2.0000 g | Freq: Once | INTRAMUSCULAR | Status: AC
  Administered 2015-03-23: 2 g via INTRAVENOUS

## 2015-03-23 MED ORDER — ACETAMINOPHEN 325 MG PO TABS
650.0000 mg | ORAL_TABLET | ORAL | Status: DC | PRN
Start: 2015-03-23 — End: 2015-03-24

## 2015-03-23 MED ORDER — LACTATED RINGERS IV SOLN
500.0000 mL | INTRAVENOUS | Status: DC | PRN
  Administered 2015-03-24: 500 mL via INTRAVENOUS

## 2015-03-23 MED ORDER — DEXTROSE 5 % IV SOLN
5.0000 10*6.[IU] | Freq: Once | INTRAVENOUS | Status: DC
  Filled 2015-03-23: qty 5

## 2015-03-23 MED ORDER — LIDOCAINE HCL (PF) 1 % IJ SOLN
30.0000 mL | INTRAMUSCULAR | Status: DC | PRN
  Filled 2015-03-23: qty 30

## 2015-03-23 MED ORDER — BUTORPHANOL TARTRATE 1 MG/ML IJ SOLN
1.0000 mg | INTRAMUSCULAR | Status: DC | PRN
  Administered 2015-03-23 – 2015-03-24 (×2): 1 mg via INTRAVENOUS
  Filled 2015-03-23 (×2): qty 1

## 2015-03-23 MED ORDER — DEXTROSE 5 % IV SOLN
2.5000 10*6.[IU] | INTRAVENOUS | Status: DC
  Filled 2015-03-23 (×3): qty 2.5

## 2015-03-23 MED ORDER — CITRIC ACID-SODIUM CITRATE 334-500 MG/5ML PO SOLN: 30.0000 mL | ORAL | Status: DC | PRN

## 2015-03-23 MED ORDER — OXYCODONE-ACETAMINOPHEN 5-325 MG PO TABS: 2.0000 | ORAL_TABLET | ORAL | Status: DC | PRN

## 2015-03-23 MED ORDER — OXYTOCIN BOLUS FROM INFUSION
500.0000 mL | INTRAVENOUS | Status: DC
  Administered 2015-03-24: 500 mL via INTRAVENOUS

## 2015-03-23 MED ORDER — ONDANSETRON HCL 4 MG/2ML IJ SOLN: 4.0000 mg | Freq: Four times a day (QID) | INTRAMUSCULAR | Status: DC | PRN

## 2015-03-23 MED ORDER — OXYTOCIN 40 UNITS IN LACTATED RINGERS INFUSION - SIMPLE MED
62.5000 mL/h | INTRAVENOUS | Status: DC
  Filled 2015-03-23: qty 1000

## 2015-03-23 MED ORDER — LACTATED RINGERS IV SOLN
INTRAVENOUS | Status: DC
  Administered 2015-03-23: 22:00:00 via INTRAVENOUS

## 2015-03-23 MED ORDER — OXYCODONE-ACETAMINOPHEN 5-325 MG PO TABS: 1.0000 | ORAL_TABLET | ORAL | Status: DC | PRN

## 2015-03-23 MED ORDER — SODIUM CHLORIDE 0.9 % IV SOLN
INTRAVENOUS | Status: AC
  Administered 2015-03-23: 2 g via INTRAVENOUS
  Filled 2015-03-23: qty 2000

## 2015-03-23 NOTE — H&P (Signed)
24 yo G3P2101 with LMP of 07/17/14 & EDD of 04/29/15 with 12 wks scan (not available) but, found on records. Pt arrived via EMS for PROM at 2130 clear fluid. Pt is seen at [facility] and has multiple risks during this pregnancy. MJ use, Cocaine use, living in homeless shelter, Social Services consult done in pat, Pt has received 2 doses of BMZ to date. 1 prior pTD at 34 weeks. Pt is c/o "feeling some leaking now and feeling some discomfort".  HISTORY AND PHYSICAL  HISTORY OF PRESENT ILLNESS: Ms. Casey Owens is a 24 y.o. [id number] at 6133w3d by LMP consistent with 12 week ultrasound with a pregnancy complicated by  Drug abuse and social issues presenting for PROM.   She has been leaking since PROM at 2130 aving contractions Q 2-3 mins and c/o  leakage of fluid, no  vaginal bleeding, or decreased fetal movement.    REVIEW OF SYSTEMS: A complete review of systems was performed and was specifically negative for headache, changes in vision, RUQ pain, shortness of breath, chest pain, lower extremity edema and dysuria.   HISTORY:  Past Medical History  Diagnosis Date  . Asthma   . Substance abuse affecting pregnancy, antepartum 02/09/2015    Positive for THC & Cocaine    No past surgical history on file.  No current facility-administered medications on file prior to encounter.   No current outpatient prescriptions on file prior to encounter.     Allergies  Allergen Reactions  . Shellfish Allergy Anaphylaxis    OB History  Gravida Para Term Preterm AB SAB TAB Ectopic Multiple Living  3 2 1 1      2     # Outcome Date GA Lbr Len/2nd Weight Sex Delivery Anes PTL Lv  3 Current           2 Preterm 06/20/11 6529w0d  4 lb 6 oz (1.984 kg) M Vag-Spont None Y Y     Complications: Preterm labor in second trimester with preterm delivery  1 Term 05/08/08 4958w0d  5 lb 4 oz (2.381 kg) F  None Y Y      Gynecologic History: History of Abnormal Pap Smear:  History of STI:   Social History  Substance Use Topics   . Smoking status: Current Every Day Smoker -- 0.25 packs/day    Types: Cigarettes  . Smokeless tobacco: Never Used  . Alcohol Use: No    PHYSICAL EXAM: Temp:  [98 F (36.7 C)] 98 F (36.7 C) (10/27 2226) Resp:  [20] 20 (10/27 2226)  GENERAL: NAD AAOx3 CHEST:CTAB no increased work of breathing CV:RRR no appreciable murmurs, rubs, gallops ABDOMEN: gravid, nontender, EFW  6#9oz  by [place] EXTREMITIES:  Warm and well-perfused, nontender, nonedematous, + DTRs, 0  clonus CERVIX: 3/ 90/vtx-1 SPECULUM: not needed  FHT s 140 baseline with  mod variability, +accelerations and  No  decelerations  Toco: q 2-3 mins, pt groaning with UC's  DIAGNOSTIC STUDIES:  Recent Labs Lab 03/17/15 1242 03/17/15 2045  WBC  --  20.5*  HGB  --  11.6*  HCT  --  34.1*  PLT  --  253  NA 137  --   K 3.8  --   CL 109  --   CO2 23  --   BUN 6  --   CREATININE 0.58  --   GLUCOSE 70  --   CALCIUM 8.8*  --   ALKPHOS 95  --   AST 22  --   ALT 20  --  PROT 6.3*  --     PRENATAL STUDIES:  Prenatal Labs:  MBT: O pos,  Rubella immune, Varicella immune, HIV neg, RPR neg, Hep B neg, GC/CT neg, GBS unknown,  glucola  Last [place] Placenta above the os, AF wnl, normal anatomy  ASSESSMENT AND PLAN:  1. Fetal Well being  - Fetal Tracing: Reacting - Ultrasound:  20 wk  reviewed, as above - Group B Streptococcus:unknown - Presentation:vtx  confirmed by exam  2. Routine OB: - Prenatal labs reviewed, as above - Rh  Pos -MJ and cocaine abuse 3. I Labor:  -  Contractions  q 2-3 mins external toco in place -  Pelvis proven to  -  Plan for delivery  4. Post Partum Planning: - Infant feeding:Bottle - Contraception: unknown

## 2015-03-24 ENCOUNTER — Inpatient Hospital Stay: Admit: 2015-03-24 | Payer: Medicaid Other

## 2015-03-24 ENCOUNTER — Encounter: Payer: Self-pay | Admitting: Internal Medicine

## 2015-03-24 ENCOUNTER — Inpatient Hospital Stay
Admit: 2015-03-24 | Discharge: 2015-03-24 | Disposition: A | Discharge status: Home / Self Care | Payer: Medicaid Other | Attending: Internal Medicine | Admitting: Internal Medicine

## 2015-03-24 DIAGNOSIS — R001 Bradycardia, unspecified: Secondary | ICD-10-CM

## 2015-03-24 LAB — URINE DRUG SCREEN, QUALITATIVE (ARMC ONLY)
Amphetamines, Ur Screen: NOT DETECTED
Barbiturates, Ur Screen: NOT DETECTED
Benzodiazepine, Ur Scrn: NOT DETECTED
COCAINE METABOLITE, UR ~~LOC~~: NOT DETECTED
Cannabinoid 50 Ng, Ur ~~LOC~~: POSITIVE — AB
MDMA (ECSTASY) UR SCREEN: NOT DETECTED
METHADONE SCREEN, URINE: NOT DETECTED
Opiate, Ur Screen: NOT DETECTED
Phencyclidine (PCP) Ur S: NOT DETECTED
TRICYCLIC, UR SCREEN: NOT DETECTED

## 2015-03-24 LAB — CBC
HCT: 34 % — ABNORMAL LOW (ref 35.0–47.0)
HEMOGLOBIN: 11.8 g/dL — AB (ref 12.0–16.0)
MCH: 33.3 pg (ref 26.0–34.0)
MCHC: 34.6 g/dL (ref 32.0–36.0)
MCV: 96.2 fL (ref 80.0–100.0)
PLATELETS: 260 10*3/uL (ref 150–440)
RBC: 3.54 MIL/uL — AB (ref 3.80–5.20)
RDW: 12.8 % (ref 11.5–14.5)
WBC: 24.3 10*3/uL — AB (ref 3.6–11.0)

## 2015-03-24 LAB — COMPREHENSIVE METABOLIC PANEL
ALT: 16 U/L (ref 14–54)
AST: 24 U/L (ref 15–41)
Albumin: 2.5 g/dL — ABNORMAL LOW (ref 3.5–5.0)
Alkaline Phosphatase: 102 U/L (ref 38–126)
Anion gap: 9 (ref 5–15)
BUN: 6 mg/dL (ref 6–20)
CHLORIDE: 106 mmol/L (ref 101–111)
CO2: 20 mmol/L — AB (ref 22–32)
CREATININE: 0.53 mg/dL (ref 0.44–1.00)
Calcium: 8.9 mg/dL (ref 8.9–10.3)
Glucose, Bld: 81 mg/dL (ref 65–99)
POTASSIUM: 3.9 mmol/L (ref 3.5–5.1)
SODIUM: 135 mmol/L (ref 135–145)
Total Bilirubin: 0.6 mg/dL (ref 0.3–1.2)
Total Protein: 5.5 g/dL — ABNORMAL LOW (ref 6.5–8.1)

## 2015-03-24 LAB — TSH: TSH: 1.43 u[IU]/mL (ref 0.350–4.500)

## 2015-03-24 LAB — TROPONIN I: Troponin I: 0.03 ng/mL (ref <0.031)

## 2015-03-24 MED ORDER — SIMETHICONE 80 MG PO CHEW
80.0000 mg | CHEWABLE_TABLET | ORAL | Status: DC | PRN
  Administered 2015-03-25: 80 mg via ORAL
  Filled 2015-03-24: qty 1

## 2015-03-24 MED ORDER — ZOLPIDEM TARTRATE 5 MG PO TABS: 5.0000 mg | ORAL_TABLET | Freq: Every evening | ORAL | Status: DC | PRN

## 2015-03-24 MED ORDER — SODIUM CHLORIDE 0.9 % IJ SOLN
3.0000 mL | Freq: Two times a day (BID) | INTRAMUSCULAR | Status: DC
  Administered 2015-03-25 (×2): 3 mL via INTRAVENOUS

## 2015-03-24 MED ORDER — ONDANSETRON HCL 4 MG/2ML IJ SOLN: 4.0000 mg | INTRAMUSCULAR | Status: DC | PRN

## 2015-03-24 MED ORDER — MEASLES, MUMPS & RUBELLA VAC ~~LOC~~ INJ: 0.5000 mL | INJECTION | Freq: Once | SUBCUTANEOUS | Status: DC

## 2015-03-24 MED ORDER — PRENATAL MULTIVITAMIN CH
1.0000 | ORAL_TABLET | Freq: Every day | ORAL | Status: DC
  Administered 2015-03-24 – 2015-03-25 (×2): 1 via ORAL
  Filled 2015-03-24 (×2): qty 1

## 2015-03-24 MED ORDER — IBUPROFEN 600 MG PO TABS
ORAL_TABLET | ORAL | Status: AC
  Administered 2015-03-24: 600 mg via ORAL
  Filled 2015-03-24: qty 1

## 2015-03-24 MED ORDER — TETANUS-DIPHTH-ACELL PERTUSSIS 5-2.5-18.5 LF-MCG/0.5 IM SUSP: 0.5000 mL | Freq: Once | INTRAMUSCULAR | Status: DC

## 2015-03-24 MED ORDER — SODIUM CHLORIDE 0.9 % IV SOLN: 250.0000 mL | INTRAVENOUS | Status: DC | PRN

## 2015-03-24 MED ORDER — FLEET ENEMA 7-19 GM/118ML RE ENEM: 1.0000 | ENEMA | Freq: Every day | RECTAL | Status: DC | PRN

## 2015-03-24 MED ORDER — BENZOCAINE-MENTHOL 20-0.5 % EX AERO: 1.0000 "application " | INHALATION_SPRAY | CUTANEOUS | Status: DC | PRN

## 2015-03-24 MED ORDER — ONDANSETRON HCL 4 MG PO TABS: 4.0000 mg | ORAL_TABLET | ORAL | Status: DC | PRN

## 2015-03-24 MED ORDER — OXYCODONE-ACETAMINOPHEN 5-325 MG PO TABS: 1.0000 | ORAL_TABLET | ORAL | Status: DC | PRN

## 2015-03-24 MED ORDER — IBUPROFEN 600 MG PO TABS
600.0000 mg | ORAL_TABLET | Freq: Four times a day (QID) | ORAL | Status: DC
  Administered 2015-03-24 – 2015-03-26 (×7): 600 mg via ORAL
  Filled 2015-03-24 (×8): qty 1

## 2015-03-24 MED ORDER — DIPHENHYDRAMINE HCL 25 MG PO CAPS: 25.0000 mg | ORAL_CAPSULE | Freq: Four times a day (QID) | ORAL | Status: DC | PRN

## 2015-03-24 MED ORDER — LANOLIN HYDROUS EX OINT: TOPICAL_OINTMENT | CUTANEOUS | Status: DC | PRN

## 2015-03-24 MED ORDER — ACETAMINOPHEN 325 MG PO TABS: 650.0000 mg | ORAL_TABLET | ORAL | Status: DC | PRN

## 2015-03-24 MED ORDER — SODIUM CHLORIDE 0.9 % IJ SOLN: 3.0000 mL | INTRAMUSCULAR | Status: DC | PRN

## 2015-03-24 MED ORDER — OXYCODONE-ACETAMINOPHEN 5-325 MG PO TABS: 2.0000 | ORAL_TABLET | ORAL | Status: DC | PRN

## 2015-03-24 MED ORDER — WITCH HAZEL-GLYCERIN EX PADS: 1.0000 "application " | MEDICATED_PAD | CUTANEOUS | Status: DC | PRN

## 2015-03-24 MED ORDER — BISACODYL 10 MG RE SUPP
10.0000 mg | Freq: Every day | RECTAL | Status: DC | PRN
  Filled 2015-03-24: qty 1

## 2015-03-24 MED ORDER — OXYTOCIN 40 UNITS IN LACTATED RINGERS INFUSION - SIMPLE MED: 62.5000 mL/h | INTRAVENOUS | Status: DC | PRN

## 2015-03-24 MED ORDER — SENNOSIDES-DOCUSATE SODIUM 8.6-50 MG PO TABS: 2.0000 | ORAL_TABLET | ORAL | Status: DC

## 2015-03-24 MED ORDER — SODIUM CHLORIDE 0.9 % IV SOLN
1.0000 g | INTRAVENOUS | Status: DC
  Administered 2015-03-24: 1 g via INTRAVENOUS
  Filled 2015-03-24 (×6): qty 1000

## 2015-03-24 MED ORDER — DIBUCAINE 1 % RE OINT: 1.0000 "application " | TOPICAL_OINTMENT | RECTAL | Status: DC | PRN

## 2015-03-24 NOTE — Progress Notes (Signed)
Dr Anne HahnWillis rounded on pt and pt is neg for any symptomaolgy for cardiac. He will order an ECHO to be done and at this time, unsure if telemetry can be continued on the PP floor. For now, pt is stable and no further care is needed. Will continue to observe.

## 2015-03-24 NOTE — Progress Notes (Addendum)
RN at the bedside to assist pt. With ambulation to BR for post delivery void, per pt. request.  Pt. Voided large amt of urine, pericare given. Steady gait. Portable telemetry monitor applied to patient.

## 2015-03-24 NOTE — Progress Notes (Signed)
Pt. desires to walk in the hallway.  RN ambulating with patient in hallway, pt. With no c/o of SOB, chest pains or visual disturbances; portable telemetry monitor remains in place.

## 2015-03-24 NOTE — Progress Notes (Signed)
[facility] [place] at [facility]   PATIENT NAME: Casey MarbleDmtrieve Owens    MR#:  [id number]  DATE OF BIRTH:  [birth year]  SUBJECTIVE:  CHIEF COMPLAINT:   Chief Complaint  Patient presents with  . Rupture of Membranes    SROM at 2130, Clear Fluid   No complaints. Doing very well after delivery of healthy baby girl  REVIEW OF SYSTEMS:   Review of Systems  Constitutional: Negative for fever.  Respiratory: Negative for shortness of breath.   Cardiovascular: Negative for chest pain and palpitations.  Gastrointestinal: Negative for nausea, vomiting and abdominal pain.  Genitourinary: Negative for dysuria.  Neurological: Negative for dizziness.    DRUG ALLERGIES:   Allergies  Allergen Reactions  . Shellfish Allergy Anaphylaxis    VITALS:  Blood pressure 128/70, pulse 46, temperature 98.1 F (36.7 C), temperature source Oral, resp. rate 18, height 5\' 4"  (1.626 m), weight 60.782 kg (134 lb), last menstrual period 07/17/2014, SpO2 100 %, unknown if currently breastfeeding.  PHYSICAL EXAMINATION:  GENERAL:  24 y.o.-year-old patient lying in the bed with no acute distress.  LUNGS: Normal breath sounds bilaterally, no wheezing, rales, rhonchi or crepitation. No use of accessory muscles of respiration.  CARDIOVASCULAR: S1, S2 normal. No murmurs, rubs, or gallops.  ABDOMEN: Not examined  EXTREMITIES: No pedal edema, cyanosis, or clubbing.  NEUROLOGIC: Cranial nerves II through XII are intact. Muscle strength 5/5 in all extremities. Sensation intact. Gait not checked.  PSYCHIATRIC: The patient is alert and oriented x 3.   LABORATORY PANEL:   CBC  Recent Labs Lab 03/24/15 0818  WBC 24.3*  HGB 11.8*  HCT 34.0*  PLT 260   ------------------------------------------------------------------------------------------------------------------  Chemistries   Recent Labs Lab 03/24/15 0818  NA 135  K 3.9  CL 106  CO2 20*  GLUCOSE 81  BUN 6  CREATININE  0.53  CALCIUM 8.9  AST 24  ALT 16  ALKPHOS 102  BILITOT 0.6   ------------------------------------------------------------------------------------------------------------------  Cardiac Enzymes  Recent Labs Lab 03/24/15 0554  TROPONINI 0.03   ------------------------------------------------------------------------------------------------------------------  RADIOLOGY:  No results found.  EKG:   Orders placed or performed during the hospital encounter of 03/23/15  . EKG 12-Lead  . EKG 12-Lead    ASSESSMENT AND PLAN:   #1 bradycardia: EKG with sinus bradycardia. Has been stable on telemetry. Blood pressure stable.  Asymptomatic, no dizziness, shortness of breath or excessive fatigue. Continue to monitor on telemetry. No acute interventions at this time. This is persistent she may need an outpatient cardiology referral for stress testing and Holter monitoring. Echocardiogram is pending. Troponin is negative. UDS positive for cannabinoids only    CODE STATUS: Full TOTAL TIME TAKING CARE OF THIS PATIENT: 20 minutes.  Greater than 50% of time spent in care coordination and counseling. POSSIBLE D/C IN 2 DAYS, DEPENDING ON CLINICAL CONDITION.   Elby ShowersWALSH, CATHERINE M.D on 03/24/2015 at 1:19 PM  Between 7am to 6pm - Pager - [phone]  After 6pm go to [email or web address] - password [email or web address] [facility]  [facility]  Office  [phone]  CC: Primary care physician; No PCP Per Patient

## 2015-03-24 NOTE — Progress Notes (Signed)
*  PRELIMINARY RESULTS* Echocardiogram 2D Echocardiogram has been performed.  Casey Owens 03/24/2015, 2:34 PM

## 2015-03-24 NOTE — Progress Notes (Addendum)
   03/24/15 0300  Clinical Encounter Type  Visited With Patient;Other (Comment)  Visit Type Initial;Other (Comment) (rapid response)  Referral From Nurse  Consult/Referral To Chaplain  Spiritual Encounters  Spiritual Needs Other (Comment) (pastoral presence)  Paged rapid response. Checked in with staff, RR was precautionary. Presence all that was needed. Chap. Karl ItoRick Doreene Forrey [phone]

## 2015-03-24 NOTE — Progress Notes (Signed)
Pt. Alert talking to FOB, pain 0/10, no c/o chest pains, SOB, nor blurred vision. RN remains at the bedside. BP 138/82, Pulse 45, O2 sats 99%-100%. Dr. Anne HahnWillis at the bedside to evaluate patient.

## 2015-03-24 NOTE — Lactation Note (Signed)
Lactation Consultation Note  Patient Name: Romie MinusDmtrieve S Barga ZOXWR'UToday's Date: 03/24/2015     Maternal Data  Mom informed of risk to baby of breastfeeding while + marijuana, pt desires to pump and dump breastmilk until she tests neg for cannabis, set up with [employer] electric breast pump and instructed how to pump breasts, and frequency, has used a breast pump before.    Feeding    LATCH Score/Interventions                      Lactation Tools Discussed/Used     Consult Status      Dyann KiefMarsha D Gaelle Adriance 03/24/2015, 6:04 PM

## 2015-03-24 NOTE — Progress Notes (Signed)
Pulse is still in the 40's range with normal BP. ECHO to be done

## 2015-03-24 NOTE — Progress Notes (Addendum)
Casey Owens at the bedside. Rapid Response called. Pt. Alert.

## 2015-03-24 NOTE — Progress Notes (Signed)
RN informed CNM that BP was elevated 160/90. I evaluated pt and she  Denies CP, SOB, or any problems. FF and lochia mod. Apical: reg, rhythm, pulses intact, Extrems: warm, mAE well. Speech is clear. Rapid response called to get a STAT EKG. Pt is resting quietly and drug screen was pos only for MJ and not cocaine.

## 2015-03-24 NOTE — Progress Notes (Signed)
Milon Scorearon Jones, CNM called to the bedside to evaluate patient for increased BP. Rapid Response called for pt. due to pulse in 40's, pt. Alert communicating with nurse and FOB, asymptomatic.  Rapid Response called at 0302, Arrived @ 0307, EKG tech and Respiratory to the bedside at 0312, Dr. Anne HahnWillis at the bedside to evaluate patient at 3:30 a.m. See MD & CNM note. Pt. Remain alert and oriented throughout evaluation with no c/o of SOB, chest pains or visual disturbance.

## 2015-03-24 NOTE — Consult Note (Signed)
[facility] [place] at [facility]   PATIENT NAME: Casey MarbleDmtrieve Owens    MR#:  [id number]  DATE OF BIRTH:  04/18/1991  DATE OF ADMISSION:  03/23/2015  PRIMARY CARE PHYSICIAN: No PCP Per Patient   REQUESTING/REFERRING PHYSICIAN: Yetta Owens, CNM  CHIEF COMPLAINT:   Chief Complaint  Patient presents with  . Rupture of Membranes    SROM at 2130, Clear Fluid    HISTORY OF PRESENT ILLNESS:  Casey Owens  is a 24 y.o. female who presents for routine term delivery. Labor and delivery went without complication. Post delivery patient was noted to have a heart rate in the 40s. EKG was ordered which showed some T-wave inversions in V1 through V3 and some questionable ST changes in V4 through V6. Patient was asymptomatic. Hospitalists were called for evaluation for possible cardiac pathology. Patient does have a history of some prior cocaine usage, but denies her having any episodes of chest pain. She does state that she has had times when she felt like her heart was "racing". He states that this happens with some frequency lately, happening even sometimes more than 2 or 3 times in a day. She's never been worked up for any sort of arrhythmia.  PAST MEDICAL HISTORY:   Past Medical History  Diagnosis Date  . Asthma   . 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 No past surgeries      SOCIAL HISTORY:   Social History  Substance Use Topics  . Smoking status: Current Every Day Smoker -- 0.25 packs/day    Types: Cigarettes  . Smokeless tobacco: Never Used  . Alcohol Use: No    FAMILY HISTORY:  No family history on file.  DRUG ALLERGIES:   Allergies  Allergen Reactions  . Shellfish Allergy Anaphylaxis    MEDICATIONS AT HOME:   Prior to Admission medications   Not on File    REVIEW OF SYSTEMS:  Review of Systems   Constitutional: Negative for fever, chills, weight loss and malaise/fatigue.  HENT: Negative for ear pain, hearing loss and tinnitus.   Eyes: Negative for blurred vision, double vision, pain and redness.  Respiratory: Negative for cough, hemoptysis and shortness of breath.   Cardiovascular: Negative for chest pain, palpitations, orthopnea and leg swelling.  Gastrointestinal: Negative for nausea, vomiting, abdominal pain, diarrhea and constipation.  Genitourinary: Negative for dysuria, frequency and hematuria.  Musculoskeletal: Negative for back pain, joint pain and neck pain.  Skin:       No acne, rash, or lesions  Neurological: Negative for dizziness, tremors, focal weakness and weakness.  Endo/Heme/Allergies: Negative for polydipsia. Does not bruise/bleed easily.  Psychiatric/Behavioral: Negative for depression. The patient is not nervous/anxious and does not have insomnia.      VITAL SIGNS:   Filed Vitals:   03/24/15 0309 03/24/15 0315 03/24/15 0316 03/24/15 0324  BP: 146/89  140/97 138/82  Pulse: 50  46 48  Temp:      TempSrc:      Resp:      Height:      Weight:      SpO2:  100%     Wt Readings from Last 3 Encounters:  03/23/15 60.782 kg (134 lb)  02/09/15 58.06 kg (128 lb)  11/16/14 55.792 kg (123 lb)    PHYSICAL EXAMINATION:  Physical Exam  Vitals reviewed. Constitutional: She is oriented to  person, place, and time. She appears well-developed and well-nourished. No distress.  HENT:  Head: Normocephalic and atraumatic.  Mouth/Throat: Oropharynx is clear and moist.  Eyes: Conjunctivae and EOM are normal. Pupils are equal, round, and reactive to light. No scleral icterus.  Neck: Normal range of motion. Neck supple. No JVD present. No thyromegaly present.  Cardiovascular: Normal rate, regular rhythm and intact distal pulses.  Exam reveals no gallop and no friction rub.   No murmur heard. Respiratory: Effort normal and breath sounds normal. No respiratory distress.  She has no wheezes. She has no rales.  GI: Soft. Bowel sounds are normal. She exhibits no distension. There is no tenderness.  Musculoskeletal: Normal range of motion. She exhibits no edema.  No arthritis, no gout  Lymphadenopathy:    She has no cervical adenopathy.  Neurological: She is alert and oriented to person, place, and time. No cranial nerve deficit.  No dysarthria, no aphasia  Skin: Skin is warm and dry. No rash noted. No erythema.  Psychiatric: She has a normal mood and affect. Her behavior is normal. Judgment and thought content normal.    LABORATORY PANEL:   CBC  Recent Labs Lab 03/23/15 2229  WBC 15.5*  HGB 11.9*  HCT 34.7*  PLT 279   ------------------------------------------------------------------------------------------------------------------  Chemistries   Recent Labs Lab 03/17/15 1242  NA 137  K 3.8  CL 109  CO2 23  GLUCOSE 70  BUN 6  CREATININE 0.58  CALCIUM 8.8*  AST 22  ALT 20  ALKPHOS 95  BILITOT 0.6   ------------------------------------------------------------------------------------------------------------------  Cardiac Enzymes No results for input(s): TROPONINI in the last 168 hours. ------------------------------------------------------------------------------------------------------------------  RADIOLOGY:  No results found.  EKG:   Orders placed or performed during the hospital encounter of 03/23/15  . EKG 12-Lead  . EKG 12-Lead    IMPRESSION AND PLAN:  Active Problems:   Indication for care or intervention related to labor and delivery - defer to labor and delivery providers.   Bradycardia - EKG with changes as noted in history of present illness. Patient is asymptomatic. Unclear for T-wave inversions are due to some prior damage in the setting of cocaine use. However, given her lack of symptoms and is less likely that she is having any active cardiac pathology at this time. Question of possible SVT or some other  arrhythmia contributing to her symptoms of "racing heart". She denies any history of syncope, and is largely asymptomatic during these episodes other than the palpitations. It is reasonable to have this workup done as an outpatient, with a referral to cardiology for possible Holter monitoring. We've ordered an echocardiogram while she is here to evaluate short-term function of her heart, and also troponin 1.  All the records are reviewed and case discussed with ED provider. Management plans discussed with the patient and/or family.  CODE STATUS:     Code Status Orders        Start     Ordered   03/23/15 2244  Full code   Continuous     03/23/15 2243    Full Code  TOTAL TIME TAKING CARE OF THIS PATIENT: 30 minutes.    Casey Owens FIELDING 03/24/2015, 3:50 AM  [facility]  Office  [phone]  CC: Primary care physician; No PCP Per Patient

## 2015-03-24 NOTE — Progress Notes (Signed)
Lab tech at the bedside to collect Troponin I.

## 2015-03-25 LAB — RPR: RPR: NONREACTIVE

## 2015-03-25 LAB — RUBELLA SCREEN: Rubella: 1.18 index (ref >0.99)

## 2015-03-25 LAB — VARICELLA ZOSTER ANTIBODY, IGG: VARICELLA IGG: 278 {index} (ref >165)

## 2015-03-25 MED ORDER — MEDROXYPROGESTERONE ACETATE 150 MG/ML IM SUSP
150.0000 mg | Freq: Once | INTRAMUSCULAR | Status: AC
  Administered 2015-03-26: 150 mg via INTRAMUSCULAR
  Filled 2015-03-25: qty 1

## 2015-03-25 NOTE — Progress Notes (Addendum)
[facility] [place] [facility]   PATIENT NAME: Casey MarbleDmtrieve Owens    MR#:  [id number]  DATE OF BIRTH:  1990-11-21  SUBJECTIVE:  CHIEF COMPLAINT:   Chief Complaint  Patient presents with  . Rupture of Membranes    SROM at 2130, Clear Fluid   No complaints. Doing very well after delivery of healthy baby girl. Denies any chest pain, shortness of breath, dizziness or lightheadedness. Heart rate fluctuated between 40-70. Echocardiogram was unremarkable as well as TSH, which was normal. Discussed with Casey Owens,  cardiologist and recommended outpatient follow-up, but no inpatient testing or further evaluations  REVIEW OF SYSTEMS:   Review of Systems  Constitutional: Negative for fever.  Respiratory: Negative for shortness of breath.   Cardiovascular: Negative for chest pain and palpitations.  Gastrointestinal: Negative for nausea, vomiting and abdominal pain.  Genitourinary: Negative for dysuria.  Neurological: Negative for dizziness.    DRUG ALLERGIES:   Allergies  Allergen Reactions  . Shellfish Allergy Anaphylaxis    VITALS:  Blood pressure 128/76, pulse 64, temperature 97.9 F (36.6 C), temperature source Oral, resp. rate 18, height 5\' 4"  (1.626 m), weight 60.782 kg (134 lb), last menstrual period 07/17/2014, SpO2 100 %, unknown if currently breastfeeding.  PHYSICAL EXAMINATION:  GENERAL:  24 y.o.-year-old patient lying in the bed with no acute distress.  LUNGS: Normal breath sounds bilaterally, no wheezing, rales, rhonchi or crepitation. No use of accessory muscles of respiration.  CARDIOVASCULAR: S1, S2 normal. No murmurs, rubs, or gallops.  ABDOMEN: Not examined  EXTREMITIES: No pedal edema, cyanosis, or clubbing.  NEUROLOGIC: Cranial nerves II through XII are intact. Muscle strength 5/5 in all extremities. Sensation intact. Gait not checked.  PSYCHIATRIC: The patient is alert and oriented x 3.   LABORATORY PANEL:   CBC  Recent  Labs Lab 03/24/15 0818  WBC 24.3*  HGB 11.8*  HCT 34.0*  PLT 260   ------------------------------------------------------------------------------------------------------------------  Chemistries   Recent Labs Lab 03/24/15 0818  NA 135  K 3.9  CL 106  CO2 20*  GLUCOSE 81  BUN 6  CREATININE 0.53  CALCIUM 8.9  AST 24  ALT 16  ALKPHOS 102  BILITOT 0.6   ------------------------------------------------------------------------------------------------------------------  Cardiac Enzymes  Recent Labs Lab 03/24/15 0554  TROPONINI 0.03   ------------------------------------------------------------------------------------------------------------------  RADIOLOGY:  No results found.  EKG:   Orders placed or performed during the hospital encounter of 03/23/15  . EKG 12-Lead  . EKG 12-Lead    ASSESSMENT AND PLAN:   #1 sinus bradycardia and sinus arrhythmia.  Blood pressure stable.  Asymptomatic, no dizziness, shortness of breath or excessive fatigue.  No acute interventions.  Echocardiogram is normal. TSH is normal . Troponin is negative. UDS positive for cannabinoids only 2. Cannabinoids abuse, patient would benefit from substance abuse program as outpatient, especially since she has 3 children, we'll get social work consultation 3. Leukocytosis, follow after delivery. No signs of infection 4. Anemia seemed to be stable postdelivery  We'll sign off now. Please call with questions, Thank you   CODE STATUS: Full TOTAL TIME TAKING CARE OF THIS PATIENT: 30 minutes.  Coordination of care time 15 minutes, discussed with cardiologist DR Welton Owens and primary OB/GYN. Casey Owens   Casey Owens on 03/25/2015 at 12:49 PM  Between 7am to 6pm - Pager - [phone]  After 6pm go to [email or web address] - password [email or web address] [facility]  [facility]  Office  [phone]  CC: Primary care physician; No PCP Per Patient

## 2015-03-25 NOTE — Progress Notes (Signed)
Pt's telemetry monitor removed per Dr. Peterson AoHalfon's order.

## 2015-03-25 NOTE — Progress Notes (Signed)
OB ATTENDING NOTE:  S: Pt doing well. Denies dizziness, HA, blurry vision, CP, SOB. Ambulating, tolerating PO, no dysuria, minimal lochia.   Ceasar Mons: Filed Vitals:   03/24/15 2023 03/25/15 0217 03/25/15 0511 03/25/15 0751  BP: 113/61 104/58 125/77 128/76  Pulse: 50 42 45 64  Temp: 98.4 F (36.9 C) 97.8 F (36.6 C) 97.5 F (36.4 C) 97.9 F (36.6 C)  TempSrc: Oral Oral Oral Oral  Resp: 18 16 16 18   Height:      Weight:      SpO2: 100%  100% 100%   General appearance: alert, cooperative and appears stated age Head: Normocephalic, without obvious abnormality, atraumatic Eyes: conjunctivae/corneas clear. PERRL, EOM's intact. Fundi benign. Lungs: clear to auscultation bilaterally Heart: regular rate and rhythm, S1, S2 normal, no murmur, click, rub or gallop; HR in 40's Abdomen: soft, non-tender; bowel sounds normal; no masses,  no organomegaly; FIRM FUNDUS Extremities: extremities normal, atraumatic, no cyanosis or edema Skin: Skin color, texture, turgor normal. No rashes or lesions   Continuous EKG/telemetry - per ICU, irregularly irregular with frequent episodes of bradycardia ECHO - pending read EKG - sinus bradycardia  Troponin - negative  CBC Latest Ref Rng 03/24/2015 03/23/2015 03/17/2015  WBC 3.6 - 11.0 K/uL 24.3(H) 15.5(H) 20.5(H)  Hemoglobin 12.0 - 16.0 g/dL 11.8(L) 11.9(L) 11.6(L)  Hematocrit 35.0 - 47.0 % 34.0(L) 34.7(L) 34.1(L)  Platelets 150 - 440 K/uL 260 279 253    A/P: 23yo [id number] s/p NSVD @ 34+3wks, pp course c/b bradycardia to low 40's and irregular rhythm per medicine. Consult in place. Patient will likely need f/u with cardiology as outpatient. Will f/u internal medicine recs today.   1. OB - Depo for pp contraception - routine care  2. Bradycardia - s/p ECHO - s/p EKG - s/p medicine consult - on telemetry - cardiology appt as outpatient  Ala DachJohanna K Daneka Lantigua, MD

## 2015-03-26 NOTE — Discharge Instructions (Signed)
Take ibuprofen 800mg  every 8 hours as needed for pain Take colace or stool softener daily if issues with constipation Follow up at [facility] for your 6 wk pp visit - please call to make an appointment Thank you for letting us participate in your care!

## 2015-03-26 NOTE — Discharge Summary (Signed)
Obstetric Discharge Summary Reason for Admission: onset of labor Prenatal Procedures: none Intrapartum Procedures: spontaneous vaginal delivery Postpartum Procedures: sinus bradycardia requiring ECHO, EKG and medicine consult Complications-Operative and Postpartum: see above HEMOGLOBIN  Date Value Ref Range Status  03/24/2015 11.8* 12.0 - 16.0 g/dL Final   HGB  Date Value Ref Range Status  08/19/2013 14.0 12.0-16.0 g/dL Final   HCT  Date Value Ref Range Status  03/24/2015 34.0* 35.0 - 47.0 % Final  08/19/2013 41.2 35.0-47.0 % Final    Physical Exam:  General: alert, cooperative, appears stated age and no distress Lochia: appropriate Uterine Fundus: firm DVT Evaluation: No evidence of DVT seen on physical exam. Negative Homan's sign.  Discharge Diagnoses: Premature labor  Discharge Information: Date: 03/26/2015 Activity: pelvic rest Diet: routine Medications: Ibuprofen and Colace Condition: stable Instructions: refer to practice specific booklet Discharge to: home Follow-up Information    [facility] Go in 2 days.   Specialty:  Cardiology   Why:  [facility] 2 PM [facility]   Contact information:   [address] [place] [address] [phone]      [facility] for 6wk pp visit.   Hospital course:  23yo [id number] s/p NSVD @ 34+3wks, pp course c/b bradycardia to low 40's and irregular rhythm. S/p telemetry, s/p normal ECHO, s/p EKG with non-specific changes and sinus bradycardia, s/p cards and medicine consults. Per medicine, no further follow up needed.   BCM: depo bridge to [facility] at [facility] Social work: s/p social work visit at last admission in October. Safe to go home. Extensive counseling on substance use and risk of transmission in breast milk (specifically MJ). Patient denies recent use and states that she will not use going forward. Wants to breast and bottle feed.   Stable for discharge  Newborn Data: Live born female  Birth  Weight: 4 lb 5 oz (1956 g) APGAR: 9, 9  Home with mother and once cleared by Nursery .  Casey Owens 03/26/2015, 10:33 AM

## 2015-03-27 LAB — SURGICAL PATHOLOGY

## 2015-08-17 ENCOUNTER — Encounter: Payer: Self-pay | Admitting: Emergency Medicine

## 2015-08-17 ENCOUNTER — Emergency Department
Admission: EM | Admit: 2015-08-17 | Discharge: 2015-08-17 | Disposition: A | Discharge status: Home / Self Care | Payer: Medicaid Other | Attending: Emergency Medicine | Admitting: Emergency Medicine

## 2015-08-17 DIAGNOSIS — F1210 Cannabis abuse, uncomplicated: Secondary | ICD-10-CM | POA: Diagnosis not present

## 2015-08-17 DIAGNOSIS — W540XXA Bitten by dog, initial encounter: Secondary | ICD-10-CM | POA: Insufficient documentation

## 2015-08-17 DIAGNOSIS — F17210 Nicotine dependence, cigarettes, uncomplicated: Secondary | ICD-10-CM | POA: Diagnosis not present

## 2015-08-17 DIAGNOSIS — Y999 Unspecified external cause status: Secondary | ICD-10-CM | POA: Insufficient documentation

## 2015-08-17 DIAGNOSIS — J45909 Unspecified asthma, uncomplicated: Secondary | ICD-10-CM | POA: Insufficient documentation

## 2015-08-17 DIAGNOSIS — Y929 Unspecified place or not applicable: Secondary | ICD-10-CM | POA: Diagnosis not present

## 2015-08-17 DIAGNOSIS — Y939 Activity, unspecified: Secondary | ICD-10-CM | POA: Insufficient documentation

## 2015-08-17 DIAGNOSIS — M79662 Pain in left lower leg: Secondary | ICD-10-CM | POA: Diagnosis present

## 2015-08-17 DIAGNOSIS — S81852A Open bite, left lower leg, initial encounter: Secondary | ICD-10-CM | POA: Insufficient documentation

## 2015-08-17 DIAGNOSIS — F4323 Adjustment disorder with mixed anxiety and depressed mood: Secondary | ICD-10-CM | POA: Diagnosis not present

## 2015-08-17 MED ORDER — OXYCODONE-ACETAMINOPHEN 5-325 MG PO TABS: 1.0000 | ORAL_TABLET | Freq: Four times a day (QID) | ORAL | Status: DC | PRN

## 2015-08-17 MED ORDER — AMOXICILLIN-POT CLAVULANATE 875-125 MG PO TABS: 1.0000 | ORAL_TABLET | Freq: Two times a day (BID) | ORAL | Status: DC

## 2015-08-17 NOTE — ED Provider Notes (Signed)
[facility] Emergency Department Provider Note  ____________________________________________  Time seen: Approximately 2:17 PM  I have reviewed the triage vital signs and the nursing notes.   HISTORY  Chief Complaint Animal Bite    HPI Dmtrieve NGA RABON is a 25 y.o. female she bit by a pit bull yesterday. Patient to animal control came by and validated dog immunization status. Patient state unable to come to the ER yesterday secondary to lack of transportation. Patient bit on posterior left thigh and cleaned area with soap and water after incident. She rates the pain as a 6/10. Patient is taking Tylenol for pain.  Past Medical History  Diagnosis Date  . Asthma   . Substance abuse affecting pregnancy, antepartum 02/09/2015    Positive for [facility] & Cocaine    Patient Active Problem List   Diagnosis Date Noted  . Bradycardia 03/24/2015  . Indication for care in labor and delivery, delivered 03/24/2015  . Indication for care or intervention related to labor and delivery 03/23/2015  . Abdominal pain 03/17/2015  . Preterm labor 03/17/2015  . 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 Tobacco use in pregnancy 02/10/2015  . Threatened preterm labor, antepartum 02/09/2015  . Adjustment disorder with mixed anxiety and depressed mood 11/17/2014  . Pregnant 11/17/2014  . Cannabis abuse 11/17/2014    Past Surgical History  Procedure Laterality Date  . No past surgeries      Current Outpatient Rx  Name  Route  Sig  Dispense  Refill  . amoxicillin-clavulanate (AUGMENTIN) 875-125 MG tablet   Oral   Take 1 tablet by mouth 2 (two) times daily.   20 tablet   0   . oxyCODONE-acetaminophen (ROXICET) 5-325 MG tablet   Oral   Take 1 tablet by mouth every 6 (six) hours as needed for moderate pain.   12 tablet   0     Allergies Shellfish allergy  History reviewed. No pertinent family history.  Social History Social History   Substance Use Topics  . Smoking status: Current Every Day Smoker -- 0.25 packs/day    Types: Cigarettes  . Smokeless tobacco: Never Used  . Alcohol Use: No    Review of Systems Constitutional: No fever/chills Eyes: No visual changes. ENT: No sore throat. Cardiovascular: Denies chest pain. Respiratory: Denies shortness of breath. Gastrointestinal: No abdominal pain.  No nausea, no vomiting.  No diarrhea.  No constipation. Genitourinary: Negative for dysuria. Musculoskeletal: Negative for back pain. Skin: Negative for rash. Swelling secondary to dog bite left posterior thigh. Neurological: Negative for headaches, focal weakness or numbness. Allergic/Immunilogical: Shellfish 10-point ROS otherwise negative.  ____________________________________________   PHYSICAL EXAM:  VITAL SIGNS: ED Triage Vitals  Enc Vitals Group     BP 08/17/15 1322 196/100 mmHg     Pulse Rate 08/17/15 1322 60     Resp 08/17/15 1322 20     Temp 08/17/15 1322 98.3 F (36.8 C)     Temp src --      SpO2 08/17/15 1322 99 %     Weight 08/17/15 1322 128 lb (58.06 kg)     Height 08/17/15 1322  (1.626 m)     Head Cir --      Peak Flow --      Pain Score 08/17/15 1323 6     Pain Loc --      Pain Edu? --      Excl. in GC? --     Constitutional: Alert and oriented.  Well appearing and in no acute distress. Eyes: Conjunctivae are normal. PERRL. EOMI. Head: Atraumatic. Nose: No congestion/rhinnorhea. Mouth/Throat: Mucous membranes are moist.  Oropharynx non-erythematous. Neck: No stridor.  No cervical spine tenderness to palpation. Hematological/Lymphatic/Immunilogical: No cervical lymphadenopathy. ardiovascular: Normal rate, regular rhythm. Grossly normal heart sounds.  Good peripheral circulation. Respiratory: Normal respiratory effort.  No retractions. Lungs CTAB. Gastrointestinal: Soft and nontender. No distention. No abdominal bruits. No CVA tenderness. Musculoskeletal: No lower extremity  tenderness nor edema.  No joint effusions. Neurologic:  Normal speech and language. No gross focal neurologic deficits are appreciated. No gait instability. Skin:  Skin is warm, dry and intact. No rash noted. Psychiatric: Mood and affect are normal. Speech and behavior are normal.  ____________________________________________   LABS (all labs ordered are listed, but only abnormal results are displayed)  Labs Reviewed - No data to display ____________________________________________  EKG   ____________________________________________  RADIOLOGY   ____________________________________________   PROCEDURES  Procedure(s) performed: None  Critical Care performed: No  ____________________________________________   INITIAL IMPRESSION / ASSESSMENT AND PLAN / ED COURSE  Pertinent labs & imaging results that were available during my care of the patient were reviewed by me and considered in my medical decision making (see chart for details).  Dog bite left posterior leg. Patient given discharge care instructions. Patient given a prescription for Augmentin and Percocet. Patient [facility] or worsening condition. ____________________________________________   FINAL CLINICAL IMPRESSION(S) / ED DIAGNOSES  Final diagnoses:  Dog bite of left lower leg, initial encounter      Joni ReiningRonald K Rachelle Edwards, PA-C 08/17/15 1433

## 2015-08-17 NOTE — ED Notes (Signed)
Reports bit by a pit bull yesterday.  Bite to back of left thigh

## 2015-08-17 NOTE — Discharge Instructions (Signed)
Animal Bite °Animal bite wounds can get infected. It is important to get proper medical treatment. Ask your doctor if you need rabies treatment. °HOME CARE  °Wound Care °· Follow instructions from your doctor about how to take care of your wound. Make sure you: °¨ Wash your hands with soap and water before you change your bandage (dressing). If you cannot use soap and water, use hand sanitizer. °¨ Change your bandage as told by your doctor. °¨ Leave stitches (sutures), skin glue, or skin tape (adhesive) strips in place. They may need to stay in place for 2 weeks or longer. If tape strips get loose and curl up, you may trim the loose edges. Do not remove tape strips completely unless your doctor says it is okay. °· Check your wound every day for signs of infection. Watch for:   °¨   Redness, swelling, or pain that gets worse.   °¨   Fluid, blood, or pus.   °General Instructions °· Take or apply over-the-counter and prescription medicines only as told by your doctor.   °· If you were prescribed an antibiotic, take or apply it as told by your doctor. Do not stop using the antibiotic even if your condition improves.   °· Keep the injured area raised (elevated) above the level of your heart while you are sitting or lying down. °· If directed, apply ice to the injured area.   °¨   Put ice in a plastic bag.   °¨   Place a towel between your skin and the bag.   °¨   Leave the ice on for 20 minutes, 2-3 times per day.   °· Keep all follow-up visits as told by your doctor. This is important.   °GET HELP IF: °· You have redness, swelling, or pain that gets worse.   °· You have a general feeling of sickness (malaise).   °· You feel sick to your stomach (nauseous). °· You throw up (vomit).   °· You have pain that does not get better.   °GET HELP RIGHT AWAY IF:  °· You have a red streak going away from your wound.   °· You have fluid, blood, or pus coming from your wound.   °· You have a fever or chills.   °· You have trouble  moving your injured area.   °· You have numbness or tingling anywhere on your body.   °  °This information is not intended to replace advice given to you by your health care provider. Make sure you discuss any questions you have with your health care provider. °  °Document Released: 05/13/2005 Document Revised: 02/01/2015 Document Reviewed: 09/28/2014 °Elsevier Interactive Patient Education ©2016 [employer] ° °

## 2015-08-17 NOTE — ED Notes (Signed)
Pt states animal control came to the scene yesterday.

## 2015-10-30 ENCOUNTER — Encounter: Payer: Self-pay | Admitting: Emergency Medicine

## 2015-10-30 ENCOUNTER — Emergency Department: Payer: Medicaid Other

## 2015-10-30 ENCOUNTER — Emergency Department
Admission: EM | Admit: 2015-10-30 | Discharge: 2015-10-30 | Disposition: A | Discharge status: Home / Self Care | Payer: Medicaid Other | Attending: Emergency Medicine | Admitting: Emergency Medicine

## 2015-10-30 DIAGNOSIS — F17210 Nicotine dependence, cigarettes, uncomplicated: Secondary | ICD-10-CM | POA: Diagnosis not present

## 2015-10-30 DIAGNOSIS — J45909 Unspecified asthma, uncomplicated: Secondary | ICD-10-CM | POA: Insufficient documentation

## 2015-10-30 DIAGNOSIS — R110 Nausea: Secondary | ICD-10-CM

## 2015-10-30 DIAGNOSIS — R1011 Right upper quadrant pain: Secondary | ICD-10-CM | POA: Diagnosis present

## 2015-10-30 DIAGNOSIS — K819 Cholecystitis, unspecified: Secondary | ICD-10-CM | POA: Insufficient documentation

## 2015-10-30 DIAGNOSIS — F1219 Cannabis abuse with unspecified cannabis-induced disorder: Secondary | ICD-10-CM | POA: Diagnosis not present

## 2015-10-30 DIAGNOSIS — Z79899 Other long term (current) drug therapy: Secondary | ICD-10-CM | POA: Insufficient documentation

## 2015-10-30 DIAGNOSIS — R52 Pain, unspecified: Secondary | ICD-10-CM

## 2015-10-30 DIAGNOSIS — K829 Disease of gallbladder, unspecified: Secondary | ICD-10-CM

## 2015-10-30 LAB — URINALYSIS COMPLETE WITH MICROSCOPIC (ARMC ONLY)
BACTERIA UA: NONE SEEN
Bilirubin Urine: NEGATIVE
GLUCOSE, UA: NEGATIVE mg/dL
Ketones, ur: NEGATIVE mg/dL
LEUKOCYTES UA: NEGATIVE
NITRITE: NEGATIVE
PROTEIN: 100 mg/dL — AB
SPECIFIC GRAVITY, URINE: 1.02 (ref 1.005–1.030)
pH: 9 — ABNORMAL HIGH (ref 5.0–8.0)

## 2015-10-30 LAB — COMPREHENSIVE METABOLIC PANEL
ALBUMIN: 4 g/dL (ref 3.5–5.0)
ALK PHOS: 67 U/L (ref 38–126)
ALT: 16 U/L (ref 14–54)
ANION GAP: 7 (ref 5–15)
AST: 21 U/L (ref 15–41)
BUN: 11 mg/dL (ref 6–20)
CO2: 24 mmol/L (ref 22–32)
CREATININE: 0.79 mg/dL (ref 0.44–1.00)
Calcium: 9 mg/dL (ref 8.9–10.3)
Chloride: 109 mmol/L (ref 101–111)
GFR calc Af Amer: >60 mL/min (ref >60)
GFR calc non Af Amer: >60 mL/min (ref >60)
GLUCOSE: 100 mg/dL — AB (ref 65–99)
POTASSIUM: 3.9 mmol/L (ref 3.5–5.1)
SODIUM: 140 mmol/L (ref 135–145)
TOTAL PROTEIN: 7.2 g/dL (ref 6.5–8.1)
Total Bilirubin: 1.2 mg/dL (ref 0.3–1.2)

## 2015-10-30 LAB — CBC
HEMATOCRIT: 39.4 % (ref 35.0–47.0)
HEMOGLOBIN: 13.4 g/dL (ref 12.0–16.0)
MCH: 32 pg (ref 26.0–34.0)
MCHC: 34 g/dL (ref 32.0–36.0)
MCV: 94.2 fL (ref 80.0–100.0)
Platelets: 269 10*3/uL (ref 150–440)
RBC: 4.18 MIL/uL (ref 3.80–5.20)
RDW: 13.7 % (ref 11.5–14.5)
WBC: 8.7 10*3/uL (ref 3.6–11.0)

## 2015-10-30 LAB — POCT PREGNANCY, URINE: PREG TEST UR: NEGATIVE

## 2015-10-30 LAB — LIPASE, BLOOD: Lipase: 18 U/L (ref 11–51)

## 2015-10-30 MED ORDER — ONDANSETRON HCL 4 MG/2ML IJ SOLN
4.0000 mg | Freq: Once | INTRAMUSCULAR | Status: AC
  Administered 2015-10-30: 4 mg via INTRAVENOUS
  Filled 2015-10-30: qty 2

## 2015-10-30 MED ORDER — SODIUM CHLORIDE 0.9 % IV SOLN
Freq: Once | INTRAVENOUS | Status: AC
  Administered 2015-10-30: 1000 mL via INTRAVENOUS

## 2015-10-30 MED ORDER — ONDANSETRON HCL 4 MG PO TABS: 4.0000 mg | ORAL_TABLET | Freq: Three times a day (TID) | ORAL | Status: DC | PRN

## 2015-10-30 MED ORDER — MORPHINE SULFATE (PF) 4 MG/ML IV SOLN
4.0000 mg | Freq: Once | INTRAVENOUS | Status: AC
  Administered 2015-10-30: 4 mg via INTRAVENOUS
  Filled 2015-10-30: qty 1

## 2015-10-30 MED ORDER — IBUPROFEN 800 MG PO TABS: 800.0000 mg | ORAL_TABLET | Freq: Three times a day (TID) | ORAL | Status: DC | PRN

## 2015-10-30 MED ORDER — IOPAMIDOL (ISOVUE-300) INJECTION 61%
100.0000 mL | Freq: Once | INTRAVENOUS | Status: AC | PRN
  Administered 2015-10-30: 100 mL via INTRAVENOUS

## 2015-10-30 MED ORDER — DIATRIZOATE MEGLUMINE & SODIUM 66-10 % PO SOLN
15.0000 mL | ORAL | Status: AC
  Administered 2015-10-30: 15 mL via ORAL

## 2015-10-30 NOTE — ED Provider Notes (Signed)
[facility] Emergency Department Provider Note   ____________________________________________  Time seen: Approximately 10:51 AM  I have reviewed the triage vital signs and the nursing notes.   HISTORY  Chief Complaint Abdominal Pain   HPI Casey Owens is a 25 y.o. female who reports nausea vomiting diarrhea with abdominal pain starting this morning. She's vomited repeatedly until she had nothing else to vomit. She has not vomited or had diarrhea since 8:00 this morning. She however still has pain and tenderness in right upper quadrant and suprapubically. The pain is moderately severe and achy in nature worse with palpation. Patient is on her menstrual cycle right now. Does not think she has any recent need to worry about any STD.   Past Medical History  Diagnosis Date  . Asthma   . Substance abuse affecting pregnancy, antepartum 02/09/2015    [facility]    Patient Active Problem List   Diagnosis Date Noted  . Bradycardia 03/24/2015  . Indication for care in labor and delivery, delivered 03/24/2015  . Indication for care or intervention related to labor and delivery 03/23/2015  . Abdominal pain 03/17/2015  . Preterm labor 03/17/2015  . 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 Tobacco use in pregnancy 02/10/2015  . Threatened preterm labor, antepartum 02/09/2015  . Adjustment disorder with mixed anxiety and depressed mood 11/17/2014  . Pregnant 11/17/2014  . Cannabis abuse 11/17/2014    Past Surgical History  Procedure Laterality Date  . No past surgeries      Current Outpatient Rx  Name  Route  Sig  Dispense  Refill  . FLUoxetine (PROZAC) 40 MG capsule   Oral   Take 40 mg by mouth daily.           Allergies Shellfish allergy  No family history on file.  Social History Social History  Substance Use Topics  . Smoking status: Current Every Day Smoker -- 0.50 packs/day    Types:  Cigarettes  . Smokeless tobacco: Never Used  . Alcohol Use: Yes    Review of Systems Constitutional: No fever/chillsDid feel hot and cold with vomiting. Eyes: No visual changes. ENT: No sore throat. Cardiovascular: Denies chest pain. Respiratory: Denies shortness of breath. Gastrointestinal: See history of present illness Genitourinary: Negative for dysuria. Musculoskeletal: Negative for back pain. Skin: Negative for rash. Neurological: Negative for headaches, focal weakness or numbness.  10-point ROS otherwise negative.  ____________________________________________   PHYSICAL EXAM:  VITAL SIGNS: ED Triage Vitals  Enc Vitals Group     BP 10/30/15 0910 148/101 mmHg     Pulse Rate 10/30/15 0910 40     Resp 10/30/15 0910 18     Temp 10/30/15 0910 97.9 F (36.6 C)     Temp Source 10/30/15 0910 Oral     SpO2 10/30/15 0910 100 %     Weight 10/30/15 0910 120 lb (54.432 kg)     Height 10/30/15 0910 5\' 4"  (1.626 m)     Head Cir --      Peak Flow --      Pain Score 10/30/15 0911 6     Pain Loc --      Pain Edu? --      Excl. in GC? --     Constitutional: Alert and oriented. Well appearing and in no acute distress. Eyes: Conjunctivae are normal. PERRL. EOMI. Head: Atraumatic. Nose: No congestion/rhinnorhea. Mouth/Throat: Mucous membranes are moist.  Oropharynx non-erythematous. Neck: No stridor.  Cardiovascular: Normal rate, regular rhythm. Grossly normal heart sounds.  Good peripheral circulation. Respiratory: Normal respiratory effort.  No retractions. Lungs CTAB. Gastrointestinal: Soft Tender to palpation under the ribs especially in the right upper quadrant. She is also tender to palpation suprapubically. Both the right upper quadrant and suprapubic areas are tender to percussion as well. He No distention. No abdominal bruits. No CVA tenderness. Genitourinary:  Musculoskeletal: No lower extremity tenderness nor edema.  No joint effusions. Neurologic:  Normal speech  and language. No gross focal neurologic deficits are appreciated. No gait instability. Skin:  Skin is warm, dry and intact. No rash noted. Psychiatric: Mood and affect are normal. Speech and behavior are normal.  ____________________________________________   LABS (all labs ordered are listed, but only abnormal results are displayed)  Labs Reviewed  COMPREHENSIVE METABOLIC PANEL - Abnormal; Notable for the following:    Glucose, Bld 100 (*)    All other components within normal limits  URINALYSIS COMPLETEWITH MICROSCOPIC ([facility] ONLY) - Abnormal; Notable for the following:    Color, Urine YELLOW (*)    APPearance CLEAR (*)    Hgb urine dipstick 2+ (*)    pH 9.0 (*)    Protein, ur 100 (*)    Squamous Epithelial / LPF 0-5 (*)    All other components within normal limits  LIPASE, BLOOD  CBC  POC URINE PREG, ED  POCT PREGNANCY, URINE   ____________________________________________  EKG   ____________________________________________  RADIOLOGY  CT shows what appears to be inflamed gallbladder I recommend radiologist recommends ultrasound Ultrasound sound shows edematous gallbladder wall consistent with a Calculus cholecystitis. Ultrasound reports no sonographic Murphy sign however the patient remains tender in the right upper quadrant. I should add that he suprapubic tenderness has resolved. ____________________________________________   PROCEDURES    ____________________________________________   INITIAL IMPRESSION / ASSESSMENT AND PLAN / ED COURSE  Pertinent labs & imaging results that were available during my care of the patient were reviewed by me and considered in my medical decision making (see chart for details).  Discussed patient with Dr. Arneta Cliche. Dr. Evette Cristal has 2 cases backed up in the operating room. He will be down as soon as he can. I notified the patient was in fact as well as her husband. ____________________________________________   FINAL CLINICAL  IMPRESSION(S) / ED DIAGNOSES  Final diagnoses:  Pain  Cholecystitis      NEW MEDICATIONS STARTED DURING THIS VISIT:  New Prescriptions   No medications on file     Note:  This document was prepared using Dragon voice recognition software and may include unintentional dictation errors.    Arnaldo Natal, MD 10/30/15 [phone]

## 2015-10-30 NOTE — ED Notes (Signed)
Nausea, vomiting and abdominal pain began yesterday evening.

## 2015-10-30 NOTE — Discharge Instructions (Signed)
Please take a clear liquid diet for the next 24 hours, then advance to bland BRAT diet as tolerated.  Return to the emergency department for severe pain, fever, inability to keep down fluids, or any other symptoms concerning to you.

## 2015-10-30 NOTE — Consult Note (Signed)
Reason for Consult:absdomnal pain, nausea and vomiting  Referring Physician: ER  Casey Owens is an 25 y.o. female.  HPI: This 25 year old female presented to the ER with a complaint of some nausea vomiting and abdominal pain starting sometime late last night. She's also had some increased bowel movements which is not normal for her stating that she moves her bowels every day at 7 AM in the morning for several days. Yesterday after work the patient apparently had some beer and hard liquor and later a sandwich of bacon and eggs and cheese around 9:00 and thereafter the evening she started experiencing abdominal pain and she subsequently presented to the emergency room.His past history is notable for him having some upper abdominal symptoms about 6 years ago when she was diagnosed having any ulcers or gastritis. She states this was based on some imaging and not by endoscopy.  Past Medical History  Diagnosis Date  . Asthma   . Substance abuse affecting pregnancy, antepartum 02/09/2015    Positive for [facility]C & Cocaine    Past Surgical History  Procedure Laterality Date  . No past surgeries      No family history on file.  Social History:  reports that she has been smoking Cigarettes.  She has been smoking about 0.50 packs per day. She has never used smokeless tobacco. She reports that she drinks alcohol. She reports that she uses illicit drugs (Cocaine and Marijuana).  Allergies:  Allergies  Allergen Reactions  . Shellfish Allergy Anaphylaxis    Medications: I have reviewed the patient's current medications.  Results for orders placed or performed during the hospital encounter of 10/30/15 (from the past 48 hour(s))  Lipase, blood     Status: None   Collection Time: 10/30/15  9:25 AM  Result Value Ref Range   Lipase 18 11 - 51 U/L  Comprehensive metabolic panel     Status: Abnormal   Collection Time: 10/30/15  9:25 AM  Result Value Ref Range   Sodium 140 135 - 145 mmol/L   Potassium 3.9 3.5 - 5.1 mmol/L   Chloride 109 101 - 111 mmol/L   CO2 24 22 - 32 mmol/L   Glucose, Bld 100 (H) 65 - 99 mg/dL   BUN 11 6 - 20 mg/dL   Creatinine, Ser 0.79 0.44 - 1.00 mg/dL   Calcium 9.0 8.9 - 10.3 mg/dL   Total Protein 7.2 6.5 - 8.1 g/dL   Albumin 4.0 3.5 - 5.0 g/dL   AST 21 15 - 41 U/L   ALT 16 14 - 54 U/L   Alkaline Phosphatase 67 38 - 126 U/L   Total Bilirubin 1.2 0.3 - 1.2 mg/dL   GFR calc non Af Amer >60 >60 mL/min   GFR calc Af Amer >60 >60 mL/min    Comment: (NOTE) The eGFR has been calculated using the CKD EPI equation. This calculation has not been validated in all clinical situations. eGFR's persistently <60 mL/min signify possible Chronic Kidney Disease.    Anion gap 7 5 - 15  CBC     Status: None   Collection Time: 10/30/15  9:25 AM  Result Value Ref Range   WBC 8.7 3.6 - 11.0 K/uL   RBC 4.18 3.80 - 5.20 MIL/uL   Hemoglobin 13.4 12.0 - 16.0 g/dL   HCT 39.4 35.0 - 47.0 %   MCV 94.2 80.0 - 100.0 fL   MCH 32.0 26.0 - 34.0 pg   MCHC 34.0 32.0 - 36.0 g/dL   RDW  13.7 11.5 - 14.5 %   Platelets 269 150 - 440 K/uL  Urinalysis complete, with microscopic     Status: Abnormal   Collection Time: 10/30/15  9:25 AM  Result Value Ref Range   Color, Urine YELLOW (A) YELLOW   APPearance CLEAR (A) CLEAR   Glucose, UA NEGATIVE NEGATIVE mg/dL   Bilirubin Urine NEGATIVE NEGATIVE   Ketones, ur NEGATIVE NEGATIVE mg/dL   Specific Gravity, Urine 1.020 1.005 - 1.030   Hgb urine dipstick 2+ (A) NEGATIVE   pH 9.0 (H) 5.0 - 8.0   Protein, ur 100 (A) NEGATIVE mg/dL   Nitrite NEGATIVE NEGATIVE   Leukocytes, UA NEGATIVE NEGATIVE   RBC / HPF TOO NUMEROUS TO COUNT 0 - 5 RBC/hpf   WBC, UA 0-5 0 - 5 WBC/hpf   Bacteria, UA NONE SEEN NONE SEEN   Squamous Epithelial / LPF 0-5 (A) NONE SEEN   Mucous PRESENT   Pregnancy, urine POC     Status: None   Collection Time: 10/30/15  9:31 AM  Result Value Ref Range   Preg Test, Ur NEGATIVE NEGATIVE    Comment:        THE  SENSITIVITY OF THIS METHODOLOGY IS >24 mIU/mL     Ct Abdomen Pelvis W Contrast  10/30/2015  CLINICAL DATA:  Nausea, vomiting and diarrhea. Right side abdominal pain. Symptoms beginning last night. EXAM: CT ABDOMEN AND PELVIS WITH CONTRAST TECHNIQUE: Multidetector CT imaging of the abdomen and pelvis was performed using the standard protocol following bolus administration of intravenous contrast. CONTRAST:  100 ml ISOVUE-300 IOPAMIDOL (ISOVUE-300) INJECTION 61% COMPARISON:  CT abdomen and pelvis 08/19/2013. FINDINGS: The lung bases are clear.  No pleural or pericardial effusion. There is gallbladder wall thickening and possibly some pericholecystic fluid. Mild periportal edema may be due to IV fluid administration. No focal liver lesion is identified. The spleen, adrenal glands, pancreas, biliary tree and kidneys all appear normal. There is a small volume of free pelvic fluid compatible physiologic change. Uterus and adnexa are unremarkable. The urinary bladder is incompletely distended but its walls appear thickened. The stomach, small and large bowel and appendix appear normal. No lymphadenopathy is identified. No focal bony abnormality. IMPRESSION: Gallbladder wall thickening and possible pericholecystic fluid may be due to cholecystitis. Right upper quadrant ultrasound is recommended for further evaluation. Although the urinary bladder is incompletely distended, its walls appear somewhat suggestive of cystitis. Electronically Signed   By: Inge Rise M.D.   On: 10/30/2015 13:09   US Abdomen Limited Ruq  10/30/2015  CLINICAL DATA:  Followup abnormal abdominal CT scan. Possible cholecystitis. EXAM: US ABDOMEN LIMITED - RIGHT UPPER QUADRANT COMPARISON:  CT scan 10/30/2015 FINDINGS: Gallbladder: There is gallbladder wall thickening and edema correlating with the CT scan. No gallstones are identified. Negative sonographic Murphy sign. No pericholecystic fluid. Common bile duct: Diameter: 3.0 mm Liver:  Normal echogenicity without focal lesion or biliary dilatation. Abnormal CT appearance of the liver with possible periportal edema, vascular congestion or inflammation/hepatitis. Recommend correlation with liver function studies. IMPRESSION: 1. Gallbladder wall thickening and gallbladder wall edema. Findings could be due to acalculus cholecystitis or low albumin or inflammation of the liver/hepatitis. 2. No gallstones or common bile duct dilatation. 3. Abnormal CT appearance of the liver could be due to hepatitis. Recommend correlation with liver function studies. Electronically Signed   By: Marijo Sanes M.D.   On: 10/30/2015 14:30    Review of Systems  Constitutional: Negative.   Respiratory: Negative.   Cardiovascular: Negative.  Gastrointestinal: Positive for nausea, vomiting, abdominal pain and diarrhea.   Blood pressure 118/92, pulse 39, temperature 97.9 F (36.6 C), temperature source Oral, resp. rate 18, height _0  (1.626 m), weight 120 lb (54.432 kg), SpO2 99 %, not currently breastfeeding. Physical Exam  Constitutional: She appears well-developed and well-nourished. She appears lethargic.  Eyes: Conjunctivae are normal. No scleral icterus.  Neck: Neck supple. No thyromegaly present.  Cardiovascular: Normal rate, regular rhythm and normal heart sounds.   Respiratory: Effort normal and breath sounds normal.  GI: Soft. Normal appearance and bowel sounds are normal. There is no hepatomegaly. There is tenderness in the right upper quadrant, right lower quadrant and suprapubic area. There is no rebound and no guarding.  Lymphadenopathy:    She has no cervical adenopathy.  Neurological: She appears lethargic.  Skin: Skin is warm and dry.    Assessment/Plan: Clinical exam is not quite focal with tenderness noted in different locations the abdomen which is fairly soft. The CT and ultrasound findings showing some mild edema and thickening in the gallbladder wall which is only partial.  This may or may not be due to acalculous cholecystitis. All her lab values including a lipase and liver functions and white count are normal. General the patient states the pain is persistent she does not appear to be in any major discomfort at this time. Prudent course would be a period of observation observation overnight- the patient was reluctant to do this. I feel it is safe enough to discharge the patient on on mild pain medicine and something for nausea. She is already been advised that if the pain persists or gets worse or if she gets additional symptoms -fever, chills -she should return for reevaluation. Thank you for allowing me to evaluate and help in the care of this patient.  SANKAR,SEEPLAPUTHUR G 10/30/2015, 4:13 PM

## 2015-10-30 NOTE — ED Provider Notes (Signed)
25 y.o. female presenting with right upper quadrant and suprapubic pain associated with nausea vomiting and diarrhea. The patient was signed out to me by Dr. Marge DuncansPaul Melinda, with findings of edema in the gallbladder. She was then evaluated by Dr. Gwenlyn SaranSenkhar, the general surgeon on-call, who does not think this patient has findings consistent with cholecystitis. She will be discharged home with some medic treatment, and follow-up with the [facility].  [name], MD 10/30/15 1627

## 2016-03-18 ENCOUNTER — Emergency Department: Payer: Medicaid Other

## 2016-03-18 ENCOUNTER — Encounter: Payer: Self-pay | Admitting: Emergency Medicine

## 2016-03-18 ENCOUNTER — Emergency Department
Admission: EM | Admit: 2016-03-18 | Discharge: 2016-03-18 | Disposition: A | Discharge status: Home / Self Care | Payer: Medicaid Other | Attending: Emergency Medicine | Admitting: Emergency Medicine

## 2016-03-18 DIAGNOSIS — O23591 Infection of other part of genital tract in pregnancy, first trimester: Secondary | ICD-10-CM | POA: Insufficient documentation

## 2016-03-18 DIAGNOSIS — O200 Threatened abortion: Secondary | ICD-10-CM | POA: Diagnosis not present

## 2016-03-18 DIAGNOSIS — O99331 Smoking (tobacco) complicating pregnancy, first trimester: Secondary | ICD-10-CM | POA: Insufficient documentation

## 2016-03-18 DIAGNOSIS — Z79899 Other long term (current) drug therapy: Secondary | ICD-10-CM | POA: Insufficient documentation

## 2016-03-18 DIAGNOSIS — N760 Acute vaginitis: Secondary | ICD-10-CM

## 2016-03-18 DIAGNOSIS — O209 Hemorrhage in early pregnancy, unspecified: Secondary | ICD-10-CM | POA: Diagnosis present

## 2016-03-18 DIAGNOSIS — B9689 Other specified bacterial agents as the cause of diseases classified elsewhere: Secondary | ICD-10-CM

## 2016-03-18 DIAGNOSIS — F17210 Nicotine dependence, cigarettes, uncomplicated: Secondary | ICD-10-CM | POA: Diagnosis not present

## 2016-03-18 DIAGNOSIS — Z3A12 12 weeks gestation of pregnancy: Secondary | ICD-10-CM | POA: Diagnosis not present

## 2016-03-18 DIAGNOSIS — J45909 Unspecified asthma, uncomplicated: Secondary | ICD-10-CM | POA: Insufficient documentation

## 2016-03-18 DIAGNOSIS — N939 Abnormal uterine and vaginal bleeding, unspecified: Secondary | ICD-10-CM

## 2016-03-18 LAB — COMPREHENSIVE METABOLIC PANEL
ALT: 13 U/L — AB (ref 14–54)
AST: 16 U/L (ref 15–41)
Albumin: 3.4 g/dL — ABNORMAL LOW (ref 3.5–5.0)
Alkaline Phosphatase: 56 U/L (ref 38–126)
Anion gap: 7 (ref 5–15)
BUN: 9 mg/dL (ref 6–20)
CHLORIDE: 106 mmol/L (ref 101–111)
CO2: 21 mmol/L — AB (ref 22–32)
CREATININE: 0.63 mg/dL (ref 0.44–1.00)
Calcium: 8.9 mg/dL (ref 8.9–10.3)
GFR calc non Af Amer: >60 mL/min (ref >60)
Glucose, Bld: 78 mg/dL (ref 65–99)
Potassium: 3.8 mmol/L (ref 3.5–5.1)
SODIUM: 134 mmol/L — AB (ref 135–145)
Total Bilirubin: 1.1 mg/dL (ref 0.3–1.2)
Total Protein: 6.9 g/dL (ref 6.5–8.1)

## 2016-03-18 LAB — CHLAMYDIA/NGC RT PCR (ARMC ONLY)
Chlamydia Tr: NOT DETECTED
N gonorrhoeae: NOT DETECTED

## 2016-03-18 LAB — URINALYSIS COMPLETE WITH MICROSCOPIC (ARMC ONLY)
BACTERIA UA: NONE SEEN
Bilirubin Urine: NEGATIVE
Glucose, UA: NEGATIVE mg/dL
Ketones, ur: NEGATIVE mg/dL
LEUKOCYTES UA: NEGATIVE
NITRITE: NEGATIVE
PH: 7 (ref 5.0–8.0)
PROTEIN: NEGATIVE mg/dL
SPECIFIC GRAVITY, URINE: 1.012 (ref 1.005–1.030)

## 2016-03-18 LAB — CBC WITH DIFFERENTIAL/PLATELET
BASOS ABS: 0 10*3/uL (ref 0–0.1)
Basophils Relative: 0 %
EOS ABS: 0.1 10*3/uL (ref 0–0.7)
EOS PCT: 1 %
HCT: 36.7 % (ref 35.0–47.0)
Hemoglobin: 12.7 g/dL (ref 12.0–16.0)
Lymphocytes Relative: 17 %
Lymphs Abs: 1.9 10*3/uL (ref 1.0–3.6)
MCH: 33.2 pg (ref 26.0–34.0)
MCHC: 34.7 g/dL (ref 32.0–36.0)
MCV: 95.6 fL (ref 80.0–100.0)
Monocytes Absolute: 0.7 10*3/uL (ref 0.2–0.9)
Monocytes Relative: 7 %
Neutro Abs: 8.6 10*3/uL — ABNORMAL HIGH (ref 1.4–6.5)
Neutrophils Relative %: 75 %
PLATELETS: 254 10*3/uL (ref 150–440)
RBC: 3.84 MIL/uL (ref 3.80–5.20)
RDW: 13.4 % (ref 11.5–14.5)
WBC: 11.3 10*3/uL — AB (ref 3.6–11.0)

## 2016-03-18 LAB — WET PREP, GENITAL
SPERM: NONE SEEN
Trich, Wet Prep: NONE SEEN
Yeast Wet Prep HPF POC: NONE SEEN

## 2016-03-18 LAB — HCG, QUANTITATIVE, PREGNANCY: HCG, BETA CHAIN, QUANT, S: 110891 m[IU]/mL — AB (ref <5)

## 2016-03-18 MED ORDER — METRONIDAZOLE 500 MG PO TABS
500.0000 mg | ORAL_TABLET | Freq: Once | ORAL | Status: AC
  Administered 2016-03-18: 500 mg via ORAL
  Filled 2016-03-18: qty 1

## 2016-03-18 MED ORDER — METRONIDAZOLE 500 MG PO TABS: 500.0000 mg | ORAL_TABLET | Freq: Two times a day (BID) | ORAL | 0 refills | Status: AC

## 2016-03-18 NOTE — ED Notes (Signed)
Pt is stating that she has not had a cycle since she had her previous child on 03/24/2015. Pt stating that she thought she might be pregnant about 3 months ago because she was experiencing morning sickness about then and currently still has nausea in the morning. Pt stating she began with vaginal bleeding this morning. Pt stating that it was "trickling" when she went to the bathroom this morning and that blood was also on her bed sheets. Pt stating some cramping in lower abdomen with sharp pains at time.

## 2016-03-18 NOTE — ED Notes (Signed)
Pt returned from [place]. Pt in NAD at this time.

## 2016-03-18 NOTE — Discharge Instructions (Addendum)
You are likely going to have some spotting.   Take flagyl twice daily for a week for bacterial vaginosis.   If you have spotting, get repeat HCG in 2 days either in [facility] or ED.   Return to ER if you have worse bleeding, clots, tissues, severe abdominal pain

## 2016-03-18 NOTE — ED Provider Notes (Signed)
[facility]-EMERGENCY DEPARTMENT Provider Note   CSN: [id number] Arrival date & time: 03/18/16  [id number]     History   Chief Complaint No chief complaint on file.   HPI Casey Owens is a 25 y.o. female G2P1 here with vaginal bleeding. She states that she has a one year old at home and breast fed for about 2 months. Has no menstrual period Since she has the baby. She had positive pregnancy test several days ago. Since this morning, she noticed a gush of blood with some clots. Patient did not see any tissues there. Patient states that she didn't get rhogam during the first pregnancy. Her chart showed that she was O positive.      The history is provided by the patient.    Past Medical History:  Diagnosis Date  . Asthma   . Substance abuse affecting pregnancy, antepartum 02/09/2015   [facility] & Cocaine    Patient Active Problem List   Diagnosis Date Noted  . Bradycardia 03/24/2015  . Indication for care in labor and delivery, delivered 03/24/2015  . Indication for care or intervention related to labor and delivery 03/23/2015  . Abdominal pain 03/17/2015  . Preterm labor 03/17/2015  . 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 Tobacco use in pregnancy 02/10/2015  . Threatened preterm labor, antepartum 02/09/2015  . Adjustment disorder with mixed anxiety and depressed mood 11/17/2014  . Pregnant 11/17/2014  . Cannabis abuse 11/17/2014    Past Surgical History:  Procedure Laterality Date  . NO PAST SURGERIES      OB History    Gravida Para Term Preterm AB Living   4 3 1 2   3    SAB TAB Ectopic Multiple Live Births         0 3       Home Medications    Prior to Admission medications   Medication Sig Start Date End Date Taking? Authorizing Provider  FLUoxetine (PROZAC) 40 MG capsule Take 40 mg by mouth daily.   Yes Historical Provider, MD  ibuprofen (ADVIL,MOTRIN) 800 MG tablet Take 1 tablet (800 mg total) by mouth every 8 (eight)  hours as needed. 10/30/15  Yes Anne-Caroline Sharma CovertNorman, MD  metroNIDAZOLE (FLAGYL) 500 MG tablet Take 1 tablet (500 mg total) by mouth 2 (two) times daily. 03/18/16 04/01/16  Charlynne Panderavid Hsienta Cael Worth, MD    Family History No family history on file.  Social History Social History  Substance Use Topics  . Smoking status: Current Every Day Smoker    Packs/day: 0.50    Types: Cigarettes  . Smokeless tobacco: Never Used  . Alcohol use No     Allergies   Shellfish allergy   Review of Systems Review of Systems  Genitourinary: Positive for vaginal bleeding.  All other systems reviewed and are negative.    Physical Exam Updated Vital Signs BP 110/62   Pulse (!) 50   Temp 98 F (36.7 C) (Oral)   Resp 16   Ht 5\' 4"  (1.626 m)   Wt 115 lb (52.2 kg)   LMP  (LMP Unknown)   SpO2 98%   BMI 19.74 kg/m   Physical Exam  Constitutional: She is oriented to person, place, and time. She appears well-developed.  Slightly anxious   HENT:  Head: Normocephalic.  Eyes: EOM are normal. Pupils are equal, round, and reactive to light.  Neck: Normal range of motion. Neck supple.  Cardiovascular: Normal rate, regular rhythm and normal heart sounds.  Pulmonary/Chest: Effort normal and breath sounds normal. No respiratory distress. She has no wheezes. She has no rales.  Abdominal: Soft. Bowel sounds are normal.  Gravid uterus, nontender   Genitourinary:  Genitourinary Comments: Whitish discharge. Os closed. No uterine or adnexal tenderness   Musculoskeletal: Normal range of motion.  Neurological: She is alert and oriented to person, place, and time.  Skin: Skin is warm.  Psychiatric: She has a normal mood and affect.  Nursing note and vitals reviewed.    ED Treatments / Results  Labs (all labs ordered are listed, but only abnormal results are displayed) Labs Reviewed  WET PREP, GENITAL - Abnormal; Notable for the following:       Result Value   Clue Cells Wet Prep HPF POC PRESENT (*)    WBC,  Wet Prep HPF POC FEW (*)    All other components within normal limits  HCG, QUANTITATIVE, PREGNANCY - Abnormal; Notable for the following:    hCG, Beta Chain, Quant, S 110,891 (*)    All other components within normal limits  CBC WITH DIFFERENTIAL/PLATELET - Abnormal; Notable for the following:    WBC 11.3 (*)    Neutro Abs 8.6 (*)    All other components within normal limits  COMPREHENSIVE METABOLIC PANEL - Abnormal; Notable for the following:    Sodium 134 (*)    CO2 21 (*)    Albumin 3.4 (*)    ALT 13 (*)    All other components within normal limits  URINALYSIS COMPLETEWITH MICROSCOPIC ([facility] ONLY) - Abnormal; Notable for the following:    Color, Urine YELLOW (*)    APPearance CLEAR (*)    Hgb urine dipstick 2+ (*)    Squamous Epithelial / LPF 0-5 (*)    All other components within normal limits  CHLAMYDIA/NGC RT PCR [facility] ONLY)    EKG  EKG Interpretation None       Radiology No results found.  Procedures Procedures (including critical care time)  Medications Ordered in ED Medications  metroNIDAZOLE (FLAGYL) tablet 500 mg (500 mg Oral Given 03/18/16 1445)     Initial Impression / Assessment and Plan / ED Course  I have reviewed the triage vital signs and the nursing notes.  Pertinent labs & imaging results that were available during my care of the patient were reviewed by me and considered in my medical decision making (see chart for details).  Clinical Course    Casey Casey Owens is a 25 y.o. female here with pregnant, vaginal bleeding. Has no [place] currently and scheduled for appointment at [facility] upstairs. Will get labs, HCG, US OB.   3:37 PM HCG 110, 000. [place] pending. Signed out to Dr. Cyril Loosen. Expect discharge if [place] confirmed IUP. Repeat HCG in 2 days if has continual bleeding.    Final Clinical Impressions(s) / ED Diagnoses   Final diagnoses:  Bacterial vaginosis  Threatened miscarriage    New Prescriptions New Prescriptions    METRONIDAZOLE (FLAGYL) 500 MG TABLET    Take 1 tablet (500 mg total) by mouth 2 (two) times daily.     Charlynne Pander, MD 03/18/16 1537

## 2016-03-18 NOTE — ED Notes (Signed)
Pt placed on monitor and MD Kinner made aware of pts HR.  No change noted to pts domineer

## 2016-03-18 NOTE — ED Triage Notes (Signed)
Woke up this morning with vaginal bleeding.  Patient has had a positive pregnancy test through the health department and is scheduled for an US for November 14th.  Patient delivered 03/24/2015 and has not had a menstral cycle since that delivery.

## 2016-03-18 NOTE — ED Notes (Signed)
Patient transported to US 

## 2016-03-18 NOTE — ED Provider Notes (Signed)
Asked by Dr,. Yao to follow up on US results and if unremarkable to discharge the patient. US shows 12 wk IUP, d/w patient cystic lesion and f/u recs   Casey Everyobert Ferman Basilio, MD 03/18/16 [phone]

## 2016-04-09 LAB — OB RESULTS CONSOLE HIV ANTIBODY (ROUTINE TESTING): HIV: NONREACTIVE

## 2016-04-10 LAB — OB RESULTS CONSOLE RPR: RPR: NONREACTIVE

## 2016-04-10 LAB — OB RESULTS CONSOLE HEPATITIS B SURFACE ANTIGEN: Hepatitis B Surface Ag: NEGATIVE

## 2016-04-11 LAB — OB RESULTS CONSOLE RUBELLA ANTIBODY, IGM: Rubella: IMMUNE

## 2016-04-11 LAB — OB RESULTS CONSOLE GC/CHLAMYDIA
Chlamydia: NEGATIVE
GC PROBE AMP, GENITAL: NEGATIVE

## 2016-04-11 LAB — OB RESULTS CONSOLE VARICELLA ZOSTER ANTIBODY, IGG: VARICELLA IGG: IMMUNE

## 2016-04-16 ENCOUNTER — Emergency Department: Payer: Medicaid Other

## 2016-04-16 ENCOUNTER — Other Ambulatory Visit: Payer: Self-pay | Admitting: Advanced Practice Midwife

## 2016-04-16 ENCOUNTER — Observation Stay
Admission: EM | Admit: 2016-04-16 | Discharge: 2016-04-17 | Disposition: A | Discharge status: Home / Self Care | Payer: Medicaid Other | Attending: Obstetrics and Gynecology | Admitting: Obstetrics and Gynecology

## 2016-04-16 ENCOUNTER — Encounter: Payer: Self-pay | Admitting: *Deleted

## 2016-04-16 DIAGNOSIS — R102 Pelvic and perineal pain: Secondary | ICD-10-CM

## 2016-04-16 DIAGNOSIS — O26892 Other specified pregnancy related conditions, second trimester: Principal | ICD-10-CM | POA: Insufficient documentation

## 2016-04-16 DIAGNOSIS — O99512 Diseases of the respiratory system complicating pregnancy, second trimester: Secondary | ICD-10-CM | POA: Diagnosis not present

## 2016-04-16 DIAGNOSIS — R1031 Right lower quadrant pain: Secondary | ICD-10-CM | POA: Diagnosis present

## 2016-04-16 DIAGNOSIS — Z3A15 15 weeks gestation of pregnancy: Secondary | ICD-10-CM | POA: Insufficient documentation

## 2016-04-16 DIAGNOSIS — O99342 Other mental disorders complicating pregnancy, second trimester: Secondary | ICD-10-CM | POA: Insufficient documentation

## 2016-04-16 DIAGNOSIS — Z3689 Encounter for other specified antenatal screening: Secondary | ICD-10-CM

## 2016-04-16 DIAGNOSIS — E876 Hypokalemia: Secondary | ICD-10-CM | POA: Insufficient documentation

## 2016-04-16 DIAGNOSIS — R109 Unspecified abdominal pain: Secondary | ICD-10-CM

## 2016-04-16 DIAGNOSIS — O99322 Drug use complicating pregnancy, second trimester: Secondary | ICD-10-CM | POA: Diagnosis not present

## 2016-04-16 DIAGNOSIS — O99332 Smoking (tobacco) complicating pregnancy, second trimester: Secondary | ICD-10-CM | POA: Diagnosis not present

## 2016-04-16 DIAGNOSIS — J45909 Unspecified asthma, uncomplicated: Secondary | ICD-10-CM | POA: Diagnosis not present

## 2016-04-16 DIAGNOSIS — N83202 Unspecified ovarian cyst, left side: Secondary | ICD-10-CM | POA: Insufficient documentation

## 2016-04-16 DIAGNOSIS — O26899 Other specified pregnancy related conditions, unspecified trimester: Secondary | ICD-10-CM | POA: Diagnosis present

## 2016-04-16 LAB — COMPREHENSIVE METABOLIC PANEL
ALBUMIN: 3.4 g/dL — AB (ref 3.5–5.0)
ALT: 15 U/L (ref 14–54)
ANION GAP: 9 (ref 5–15)
AST: 19 U/L (ref 15–41)
Alkaline Phosphatase: 81 U/L (ref 38–126)
BUN: 6 mg/dL (ref 6–20)
CHLORIDE: 104 mmol/L (ref 101–111)
CO2: 20 mmol/L — ABNORMAL LOW (ref 22–32)
Calcium: 8.9 mg/dL (ref 8.9–10.3)
Creatinine, Ser: 0.66 mg/dL (ref 0.44–1.00)
GFR calc Af Amer: >60 mL/min (ref >60)
GLUCOSE: 71 mg/dL (ref 65–99)
POTASSIUM: 3.2 mmol/L — AB (ref 3.5–5.1)
Sodium: 133 mmol/L — ABNORMAL LOW (ref 135–145)
Total Bilirubin: 1.2 mg/dL (ref 0.3–1.2)
Total Protein: 7.2 g/dL (ref 6.5–8.1)

## 2016-04-16 LAB — URINALYSIS COMPLETE WITH MICROSCOPIC (ARMC ONLY)
BACTERIA UA: NONE SEEN
Bilirubin Urine: NEGATIVE
GLUCOSE, UA: NEGATIVE mg/dL
HGB URINE DIPSTICK: NEGATIVE
LEUKOCYTES UA: NEGATIVE
Nitrite: NEGATIVE
PH: 5 (ref 5.0–8.0)
PROTEIN: 30 mg/dL — AB
RBC / HPF: NONE SEEN RBC/hpf (ref 0–5)
Specific Gravity, Urine: 1.026 (ref 1.005–1.030)

## 2016-04-16 LAB — WET PREP, GENITAL
Clue Cells Wet Prep HPF POC: NONE SEEN
SPERM: NONE SEEN
TRICH WET PREP: NONE SEEN
WBC, Wet Prep HPF POC: NONE SEEN
Yeast Wet Prep HPF POC: NONE SEEN

## 2016-04-16 LAB — LIPASE, BLOOD: LIPASE: 11 U/L (ref 11–51)

## 2016-04-16 LAB — CBC
HEMATOCRIT: 36.5 % (ref 35.0–47.0)
HEMOGLOBIN: 12.6 g/dL (ref 12.0–16.0)
MCH: 32.3 pg (ref 26.0–34.0)
MCHC: 34.6 g/dL (ref 32.0–36.0)
MCV: 93.5 fL (ref 80.0–100.0)
Platelets: 250 10*3/uL (ref 150–440)
RBC: 3.91 MIL/uL (ref 3.80–5.20)
RDW: 12.5 % (ref 11.5–14.5)
WBC: 14 10*3/uL — AB (ref 3.6–11.0)

## 2016-04-16 LAB — HCG, QUANTITATIVE, PREGNANCY: HCG, BETA CHAIN, QUANT, S: 47592 m[IU]/mL — AB (ref <5)

## 2016-04-16 LAB — CHLAMYDIA/NGC RT PCR (ARMC ONLY)
CHLAMYDIA TR: NOT DETECTED
N gonorrhoeae: NOT DETECTED

## 2016-04-16 MED ORDER — SODIUM CHLORIDE 0.9 % IV BOLUS (SEPSIS)
1000.0000 mL | Freq: Once | INTRAVENOUS | Status: AC
  Administered 2016-04-16: 1000 mL via INTRAVENOUS

## 2016-04-16 MED ORDER — MORPHINE SULFATE (PF) 4 MG/ML IV SOLN
4.0000 mg | Freq: Once | INTRAVENOUS | Status: AC
  Administered 2016-04-16: 4 mg via INTRAVENOUS
  Filled 2016-04-16: qty 1

## 2016-04-16 MED ORDER — POTASSIUM CHLORIDE 2 MEQ/ML IV SOLN
INTRAVENOUS | Status: DC
  Administered 2016-04-17: 01:00:00 via INTRAVENOUS
  Filled 2016-04-16 (×6): qty 1000

## 2016-04-16 MED ORDER — MORPHINE SULFATE (PF) 4 MG/ML IV SOLN
1.0000 mg | INTRAVENOUS | Status: DC | PRN
  Administered 2016-04-17 (×3): 2 mg via INTRAVENOUS
  Filled 2016-04-16 (×3): qty 1

## 2016-04-16 MED ORDER — POTASSIUM CHLORIDE CRYS ER 20 MEQ PO TBCR
40.0000 meq | EXTENDED_RELEASE_TABLET | Freq: Once | ORAL | Status: AC
  Administered 2016-04-16: 40 meq via ORAL

## 2016-04-16 MED ORDER — POTASSIUM CHLORIDE CRYS ER 20 MEQ PO TBCR
EXTENDED_RELEASE_TABLET | ORAL | Status: AC
  Administered 2016-04-16: 40 meq via ORAL
  Filled 2016-04-16: qty 2

## 2016-04-16 MED ORDER — ONDANSETRON HCL 4 MG PO TABS: 4.0000 mg | ORAL_TABLET | Freq: Four times a day (QID) | ORAL | Status: DC | PRN

## 2016-04-16 MED ORDER — ONDANSETRON HCL 4 MG/2ML IJ SOLN
4.0000 mg | Freq: Four times a day (QID) | INTRAMUSCULAR | Status: DC | PRN
  Administered 2016-04-17: 4 mg via INTRAVENOUS
  Filled 2016-04-16: qty 2

## 2016-04-16 MED ORDER — PRENATAL MULTIVITAMIN CH: 1.0000 | ORAL_TABLET | Freq: Every day | ORAL | Status: DC

## 2016-04-16 NOTE — ED Provider Notes (Signed)
[facility] Emergency Department Provider Note   ____________________________________________   First MD Initiated Contact with Patient 04/16/16 1750     (approximate)  I have reviewed the triage vital signs and the nursing notes.   HISTORY  Chief Complaint Abdominal Pain  HPI Casey Owens is a 25 y.o. female who is presently [redacted] weeks pregnant  Patient reports that about noon today she began experiencing pain in the right lower abdomen that is steadily progressed throughout the day and is now severe. Not associated vaginal discharge, loss of fluid or bleeding. She's had preterm labor before but denies any contractile pain and states that this does not feel anything like preterm labor. She's felt nauseated, but reports this is typical for her in the morning sickness is not usual for her to vomit which she did this morning as well due to the pregnancy. Her concern at this time is increasing, severe, right lower abdominal pain that seems to be worse with her she tries to move.     Past Medical History:  Diagnosis Date  . Asthma   . Substance abuse affecting pregnancy, antepartum 02/09/2015   Positive for [facility] & Cocaine    Patient Active Problem List   Diagnosis Date Noted  . Bradycardia 03/24/2015  . Indication for care in labor and delivery, delivered 03/24/2015  . Indication for care or intervention related to labor and delivery 03/23/2015  . Abdominal pain 03/17/2015  . Preterm labor 03/17/2015  . 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 Tobacco use in pregnancy 02/10/2015  . Threatened preterm labor, antepartum 02/09/2015  . Adjustment disorder with mixed anxiety and depressed mood 11/17/2014  . Pregnant 11/17/2014  . Cannabis abuse 11/17/2014    Past Surgical History:  Procedure Laterality Date  . NO PAST SURGERIES      Prior to Admission medications   Medication Sig Start Date End Date Taking? Authorizing  Provider  FLUoxetine (PROZAC) 40 MG capsule Take 40 mg by mouth daily.    Historical Provider, MD  ibuprofen (ADVIL,MOTRIN) 800 MG tablet Take 1 tablet (800 mg total) by mouth every 8 (eight) hours as needed. 10/30/15   Rockne Menghini, MD    Allergies Shellfish allergy  No family history on file.  Social History Social History  Substance Use Topics  . Smoking status: Current Every Day Smoker    Packs/day: 0.50    Types: Cigarettes  . Smokeless tobacco: Never Used  . Alcohol use No    Review of Systems Constitutional: No fever/chills Eyes: No visual changes. ENT: No sore throat. Cardiovascular: Denies chest pain. Respiratory: Denies shortness of breath. Gastrointestinal:   No diarrhea.  No constipation. Genitourinary: Negative for dysuria. Musculoskeletal: Negative for back pain. Skin: Negative for rash. Neurological: Negative for headaches, focal weakness or numbness.  10-point ROS otherwise negative.  ____________________________________________   PHYSICAL EXAM:  VITAL SIGNS: ED Triage Vitals  Enc Vitals Group     BP 04/16/16 1636 101/69     Pulse Rate 04/16/16 1636 86     Resp 04/16/16 1636 20     Temp 04/16/16 1636 98.4 F (36.9 C)     Temp Source 04/16/16 1636 Oral     SpO2 04/16/16 1636 100 %     Weight 04/16/16 1628 115 lb (52.2 kg)     Height 04/16/16 1628 5\' 4"  (1.626 m)     Head Circumference --      Peak Flow --  Pain Score 04/16/16 1629 8     Pain Loc --      Pain Edu? --      Excl. in GC? --     Constitutional: Alert and oriented. Well appearing Though she does hold her hand or her abdomen and looks as though she is having pain especially when trying to move Eyes: Conjunctivae are normal. PERRL. EOMI. Head: Atraumatic. Nose: No congestion/rhinnorhea. Mouth/Throat: Mucous membranes are moist.  Oropharynx non-erythematous. Neck: No stridor.   Cardiovascular: Normal rate, regular rhythm. Grossly normal heart sounds.  Good peripheral  circulation. Respiratory: Normal respiratory effort.  No retractions. Lungs CTAB. Gastrointestinal: Gravid, uterus about midpoint between the umbilicus and pelvic brim about 16-18 cm. No tenderness suprapubically over the left lower abdomen right upper quadrant or left upper quadrant. The patient has notable tenderness with mild rebound tenderness, and severe discomfort to palpation in the right lower abdomen. GYN exam performed with nurse Jae DireKate. Patient has mild slight discharge noted at the os, somewhat light yellow in color. No leakage of clear pink fluid. The os is normal in size, it is not dilated. There is no adnexal tenderness noted, no cervical motion tenderness. Slight abnormal odor. Musculoskeletal: No lower extremity tenderness nor edema.   Neurologic:  Normal speech and language. No gross focal neurologic deficits are appreciated. Skin:  Skin is warm, dry and intact. No rash noted. Psychiatric: Mood and affect are normal. Speech and behavior are normal.  ____________________________________________   LABS (all labs ordered are listed, but only abnormal results are displayed)  Labs Reviewed  COMPREHENSIVE METABOLIC PANEL - Abnormal; Notable for the following:       Result Value   Sodium 133 (*)    Potassium 3.2 (*)    CO2 20 (*)    Albumin 3.4 (*)    All other components within normal limits  CBC - Abnormal; Notable for the following:    WBC 14.0 (*)    All other components within normal limits  URINALYSIS COMPLETEWITH MICROSCOPIC ([facility] ONLY) - Abnormal; Notable for the following:    Color, Urine YELLOW (*)    APPearance CLEAR (*)    Ketones, ur 2+ (*)    Protein, ur 30 (*)    Squamous Epithelial / LPF 0-5 (*)    All other components within normal limits  HCG, QUANTITATIVE, PREGNANCY - Abnormal; Notable for the following:    hCG, Beta Chain, Quant, S 78,29547,592 (*)    All other components within normal limits  CHLAMYDIA/NGC RT PCR ([facility] ONLY)  WET PREP, GENITAL    LIPASE, BLOOD   ____________________________________________  EKG   ____________________________________________  RADIOLOGY  Mr Pelvis Wo Contrast  Result Date: 04/16/2016 CLINICAL DATA:  Right lower quadrant pain during pregnancy. Evaluate for appendicitis. EXAM: MRI ABDOMEN AND PELVIS WITHOUT CONTRAST TECHNIQUE: Multiplanar multisequence MR imaging of the abdomen and pelvis was performed. No intravenous contrast was administered. COMPARISON:  Ultrasound pelvis 04/16/2016. CT abdomen and pelvis 10/30/2015. FINDINGS: COMBINED FINDINGS FOR BOTH MR ABDOMEN AND PELVIS Lower chest: No acute findings. Hepatobiliary: No mass or other parenchymal abnormality identified. Pancreas: No mass, inflammatory changes, or other parenchymal abnormality identified. Spleen:  Within normal limits in size and appearance. Adrenals/Urinary Tract: No masses identified. No evidence of hydronephrosis. Stomach/Bowel: Stomach and small bowel are mostly decompressed. Gas and stool in the colon. No colonic distention or wall thickening appreciated. The appendix is normal. Vascular/Lymphatic: No pathologically enlarged lymph nodes identified. No abdominal aortic aneurysm demonstrated. Reproductive: An intrauterine pregnancy is present.  Fetus is in cephalic presentation. Placenta is fundal. Ovaries are not enlarged. Cyst in the left ovary likely representing corpus luteal cyst. Small amount of free fluid in the pelvis is likely physiologic. Other:  None. Musculoskeletal: No suspicious bone lesions identified. IMPRESSION: Intrauterine pregnancy. Appendix is normal. No evidence to suggest appendicitis. No bowel obstruction. No hydronephrosis. Electronically Signed   By: Burman Nieves M.D.   On: 04/16/2016 23:09   Mr Abdomen Wo Contrast  Result Date: 04/16/2016 CLINICAL DATA:  Right lower quadrant pain during pregnancy. Evaluate for appendicitis. EXAM: MRI ABDOMEN AND PELVIS WITHOUT CONTRAST TECHNIQUE: Multiplanar  multisequence MR imaging of the abdomen and pelvis was performed. No intravenous contrast was administered. COMPARISON:  Ultrasound pelvis 04/16/2016. CT abdomen and pelvis 10/30/2015. FINDINGS: COMBINED FINDINGS FOR BOTH MR ABDOMEN AND PELVIS Lower chest: No acute findings. Hepatobiliary: No mass or other parenchymal abnormality identified. Pancreas: No mass, inflammatory changes, or other parenchymal abnormality identified. Spleen:  Within normal limits in size and appearance. Adrenals/Urinary Tract: No masses identified. No evidence of hydronephrosis. Stomach/Bowel: Stomach and small bowel are mostly decompressed. Gas and stool in the colon. No colonic distention or wall thickening appreciated. The appendix is normal. Vascular/Lymphatic: No pathologically enlarged lymph nodes identified. No abdominal aortic aneurysm demonstrated. Reproductive: An intrauterine pregnancy is present. Fetus is in cephalic presentation. Placenta is fundal. Ovaries are not enlarged. Cyst in the left ovary likely representing corpus luteal cyst. Small amount of free fluid in the pelvis is likely physiologic. Other:  None. Musculoskeletal: No suspicious bone lesions identified. IMPRESSION: Intrauterine pregnancy. Appendix is normal. No evidence to suggest appendicitis. No bowel obstruction. No hydronephrosis. Electronically Signed   By: Burman Nieves M.D.   On: 04/16/2016 23:09   US Ob Limited  Result Date: 04/16/2016 CLINICAL DATA:  Right lower quadrant pain for 1 day EXAM: LIMITED OBSTETRIC ULTRASOUND AN DOPPLER FINDINGS: Number of Fetuses: 1 Heart Rate:  155 bpm Movement: Present Presentation: Cephalic Placental Location: Fundal and posterior Previa: None Amniotic Fluid (Subjective):  Within normal limits. BPD:  3.5cm 16w  6d MATERNAL FINDINGS: Cervix:  Appears closed. Uterus/Adnexae: Uterus grossly unremarkable. Right ovary measures 3.6 x 1.5 x 1.5 cm. The left ovary measures 4.7 x 2.9 by 3.3 cm. Sonolucent cyst in the left  ovary measuring 2.5 x 3 x 2.1 cm. Pulsed Doppler evaluation of both ovaries demonstrates normal low-resistance arterial and venous waveforms. Other findings No free fluid. IMPRESSION: 1. No sonographic evidence for ovarian torsion. 2. Single intrauterine gestation with measurements as above. This exam is performed on an emergent basis and does not comprehensively evaluate fetal size, dating, or anatomy; follow-up complete OB US should be considered if further fetal assessment is warranted. Electronically Signed   By: Jasmine Pang M.D.   On: 04/16/2016 20:47   [place] Art/ven Flow Abd Pelv Doppler  Result Date: 04/16/2016 CLINICAL DATA:  Right lower quadrant pain for 1 day EXAM: LIMITED OBSTETRIC ULTRASOUND AN DOPPLER FINDINGS: Number of Fetuses: 1 Heart Rate:  155 bpm Movement: Present Presentation: Cephalic Placental Location: Fundal and posterior Previa: None Amniotic Fluid (Subjective):  Within normal limits. BPD:  3.5cm 16w  6d MATERNAL FINDINGS: Cervix:  Appears closed. Uterus/Adnexae: Uterus grossly unremarkable. Right ovary measures 3.6 x 1.5 x 1.5 cm. The left ovary measures 4.7 x 2.9 by 3.3 cm. Sonolucent cyst in the left ovary measuring 2.5 x 3 x 2.1 cm. Pulsed Doppler evaluation of both ovaries demonstrates normal low-resistance arterial and venous waveforms. Other findings No free fluid. IMPRESSION: 1. No  sonographic evidence for ovarian torsion. 2. Single intrauterine gestation with measurements as above. This exam is performed on an emergent basis and does not comprehensively evaluate fetal size, dating, or anatomy; follow-up complete OB US should be considered if further fetal assessment is warranted. Electronically Signed   By: Jasmine PangKim  Fujinaga M.D.   On: 04/16/2016 20:47    ____________________________________________   PROCEDURES  Procedure(s) performed: None  Procedures  Critical Care performed: No  ____________________________________________   INITIAL IMPRESSION / ASSESSMENT AND  PLAN / ED COURSE  Pertinent labs & imaging results that were available during my care of the patient were reviewed by me and considered in my medical decision making (see chart for details).  Pregnant. No symptoms of impending delivery or preterm labor by clinical history, but she certainly has focal right lower abdominal pain. Raises my suspicion for intra-abdominal etiology or pelvic etiology such as appendicitis, ruptured or torsed ovary or hemorrhagic cyst, and given symptomatology and her description less likely acute infection such as pelvic inflammatory disease or acute complication related to the pregnancy itself. I do have a high suspicion for potential intra-abdominal or acute condition.    Clinical Course as of Apr 16 2320  Tue Apr 16, 2016  2058 Patient resting,reevaluated and she continues to report significant pain in the right lower abdomen. Page [facility]enthalJackson [facility], concern for appendicitis versus other acute intra-abdominal pathology remains high at this time.  [MQ]    Clinical Course User Index [MQ] Sharyn CreamerMark Quale, MD   ----------------------------------------- 10:11 PM on 04/16/2016 -----------------------------------------  I discussed the case at about 9 PM with Dr. Feliberto GottronSchermerhorn who recommends general surgery evaluation and likely MRI of the abdomen and pelvis to further evaluate for "appendicitis". I agree with this assessment, and discussed with Dr. Orvis BrillLoflin who recommends an MRI as well. Patient resting, pain improved after morphine but still having persistent focal right lower quadrant pain. This point proceeding with MRI abdomen and pelvis without contrast, and plan to disposition thereafter.  Ongoing care assigned to Dr. Manson PasseyBrown with plan to await recommendations in consult from Dr. Feliberto GottronSchermerhorn for evaluation of persistent right lower quadrant pain and pregnancy.  Case discussed with Dr. Feliberto GottronSchermerhorn who is in agreement with plan to see patient in consult in  the ED this evening for right lower quadrant pain. Ongoing care assigned to Dr. Manson PasseyBrown, follow-up on consult recommendation  ____________________________________________   FINAL CLINICAL IMPRESSION(S) / ED DIAGNOSES  Final diagnoses:  Right lower quadrant pain      NEW MEDICATIONS STARTED DURING THIS VISIT:  New Prescriptions   No medications on file     Note:  This document was prepared using Dragon voice recognition software and may include unintentional dictation errors.     Sharyn CreamerMark Quale, MD 04/16/16 [phone]

## 2016-04-16 NOTE — ED Notes (Addendum)
States 4th pregnancy. Has not seen OB yet this pregnancy, but [facility]. States 2 pregnancies had at 34 weeks, the third was at 37 weeks. States 16 weeks 1 day at this time.

## 2016-04-16 NOTE — ED Notes (Signed)
Pt went to ultrasound.

## 2016-04-16 NOTE — Progress Notes (Signed)
H+P dictated at 2355  On 04/16/16 [redacted] week EGA with RLQ pain resulting from scooter falling on right side today . U/S and MRI done by ed , both WNL . No evidence of appendicitis . WBC 14K . No evidence of abruption . No vag bleeding  Poor nutrition . + MJ use a few weeks ago . Hypo kalemia  Admitted for pain control and observation .  IVF and K replacement  Labs repeated in am

## 2016-04-16 NOTE — ED Notes (Signed)
Pt wheeled to restroom in wheelchair. Ambulatory while in restroom. Pt given warm blanket because she is shivering, states she doesn't take the cold well. Pt laying back on stretcher.

## 2016-04-16 NOTE — ED Notes (Signed)
Pt returned from MRI via stretcher with Raynelle FanningJulie, EDT

## 2016-04-16 NOTE — ED Notes (Signed)
Pt transported to MRI with Raynelle FanningJulie, EDT

## 2016-04-16 NOTE — ED Triage Notes (Signed)
Pt is [redacted] weeks pregnant with abdominal pain, pt denies vaginal bleeding or any fluid leaking,pt reports pain is constant

## 2016-04-16 NOTE — ED Notes (Signed)
Pt on phone with MRI going over screening questions

## 2016-04-16 NOTE — ED Notes (Signed)
Pt has low abd pain.  Pt is [redacted] weeks pregnant.  Pt states no vag bleeding   No dysuria.  Pt states she has a cyst on ovary.  No n/v/d.  Pt alert.

## 2016-04-17 DIAGNOSIS — R102 Pelvic and perineal pain: Secondary | ICD-10-CM

## 2016-04-17 DIAGNOSIS — O26892 Other specified pregnancy related conditions, second trimester: Secondary | ICD-10-CM | POA: Diagnosis present

## 2016-04-17 LAB — COMPREHENSIVE METABOLIC PANEL
ALBUMIN: 2.6 g/dL — AB (ref 3.5–5.0)
ALK PHOS: 65 U/L (ref 38–126)
ALT: 14 U/L (ref 14–54)
AST: 20 U/L (ref 15–41)
Anion gap: 5 (ref 5–15)
CALCIUM: 8.4 mg/dL — AB (ref 8.9–10.3)
CHLORIDE: 109 mmol/L (ref 101–111)
CO2: 21 mmol/L — AB (ref 22–32)
CREATININE: 0.63 mg/dL (ref 0.44–1.00)
GFR calc non Af Amer: >60 mL/min (ref >60)
GLUCOSE: 88 mg/dL (ref 65–99)
Potassium: 3.8 mmol/L (ref 3.5–5.1)
SODIUM: 135 mmol/L (ref 135–145)
Total Bilirubin: 0.7 mg/dL (ref 0.3–1.2)
Total Protein: 5.9 g/dL — ABNORMAL LOW (ref 6.5–8.1)

## 2016-04-17 LAB — CBC WITH DIFFERENTIAL/PLATELET
BASOS PCT: 0 %
Basophils Absolute: 0 10*3/uL (ref 0–0.1)
EOS ABS: 0.1 10*3/uL (ref 0–0.7)
Eosinophils Relative: 2 %
HCT: 31.7 % — ABNORMAL LOW (ref 35.0–47.0)
HEMOGLOBIN: 11.3 g/dL — AB (ref 12.0–16.0)
LYMPHS ABS: 1.1 10*3/uL (ref 1.0–3.6)
Lymphocytes Relative: 16 %
MCH: 33.3 pg (ref 26.0–34.0)
MCHC: 35.7 g/dL (ref 32.0–36.0)
MCV: 93.4 fL (ref 80.0–100.0)
MONO ABS: 0.8 10*3/uL (ref 0.2–0.9)
MONOS PCT: 12 %
Neutro Abs: 4.9 10*3/uL (ref 1.4–6.5)
Neutrophils Relative %: 70 %
Platelets: 223 10*3/uL (ref 150–440)
RBC: 3.39 MIL/uL — ABNORMAL LOW (ref 3.80–5.20)
RDW: 12.6 % (ref 11.5–14.5)
WBC: 7 10*3/uL (ref 3.6–11.0)

## 2016-04-17 LAB — URINE DRUG SCREEN, QUALITATIVE (ARMC ONLY)
Amphetamines, Ur Screen: NOT DETECTED
BARBITURATES, UR SCREEN: NOT DETECTED
BENZODIAZEPINE, UR SCRN: NOT DETECTED
CANNABINOID 50 NG, UR ~~LOC~~: POSITIVE — AB
COCAINE METABOLITE, UR ~~LOC~~: NOT DETECTED
MDMA (Ecstasy)Ur Screen: NOT DETECTED
METHADONE SCREEN, URINE: NOT DETECTED
OPIATE, UR SCREEN: NOT DETECTED
Phencyclidine (PCP) Ur S: NOT DETECTED
TRICYCLIC, UR SCREEN: NOT DETECTED

## 2016-04-17 MED ORDER — INFLUENZA VAC SPLIT QUAD 0.5 ML IM SUSY: 0.5000 mL | PREFILLED_SYRINGE | INTRAMUSCULAR | Status: DC
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
## 2016-04-17 NOTE — Discharge Instructions (Signed)
Abdominal Pain During Pregnancy °Belly (abdominal) pain is common during pregnancy. Most of the time, it is not a serious problem. Other times, it can be a sign that something is wrong with the pregnancy. Always tell your doctor if you have belly pain. °Follow these instructions at home: °Monitor your belly pain for any changes. The following actions may help you feel better: °· Do not have sex (intercourse) or put anything in your vagina until you feel better. °· Rest until your pain stops. °· Drink clear fluids if you feel sick to your stomach (nauseous). Do not eat solid food until you feel better. °· Only take medicine as told by your doctor. °· Keep all doctor visits as told. °Get help right away if: °· You are bleeding, leaking fluid, or pieces of tissue come out of your vagina. °· You have more pain or cramping. °· You keep throwing up (vomiting). °· You have pain when you pee (urinate) or have blood in your pee. °· You have a fever. °· You do not feel your baby moving as much. °· You feel very weak or feel like passing out. °· You have trouble breathing, with or without belly pain. °· You have a very bad headache and belly pain. °· You have fluid leaking from your vagina and belly pain. °· You keep having watery poop (diarrhea). °· Your belly pain does not go away after resting, or the pain gets worse. °This information is not intended to replace advice given to you by your health care provider. Make sure you discuss any questions you have with your health care provider. °Document Released: 05/01/2009 Document Revised: 12/20/2015 Document Reviewed: 12/10/2012 °Elsevier Interactive Patient Education © 2017 [employer] Inc[employer] ° °
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
## 2016-04-17 NOTE — Progress Notes (Signed)
Patient discharged home. Vital signs stable, pain well controlled. Discharge instructions and follow up appointment given to and reviewed with patient. Patient verbalized understanding, all questions answered. Escorted in wheelchair by nursing.    Imagene ShellerMegan Jalik Gellatly, RN

## 2016-05-02 ENCOUNTER — Encounter: Payer: Self-pay | Admitting: *Deleted

## 2016-05-02 ENCOUNTER — Ambulatory Visit
Admission: RE | Admit: 2016-05-02 | Discharge: 2016-05-02 | Disposition: A | Discharge status: Home / Self Care | Payer: Medicaid Other | Source: Ambulatory Visit | Attending: Obstetrics and Gynecology | Admitting: Obstetrics and Gynecology

## 2016-05-02 DIAGNOSIS — O4702 False labor before 37 completed weeks of gestation, second trimester: Secondary | ICD-10-CM | POA: Insufficient documentation

## 2016-05-02 DIAGNOSIS — Z3689 Encounter for other specified antenatal screening: Secondary | ICD-10-CM

## 2016-05-02 DIAGNOSIS — O479 False labor, unspecified: Secondary | ICD-10-CM

## 2016-05-02 DIAGNOSIS — N83292 Other ovarian cyst, left side: Secondary | ICD-10-CM | POA: Insufficient documentation

## 2016-05-02 DIAGNOSIS — O3482 Maternal care for other abnormalities of pelvic organs, second trimester: Secondary | ICD-10-CM | POA: Insufficient documentation

## 2016-05-02 DIAGNOSIS — Z3A18 18 weeks gestation of pregnancy: Secondary | ICD-10-CM | POA: Diagnosis not present

## 2016-05-02 DIAGNOSIS — O4700 False labor before 37 completed weeks of gestation, unspecified trimester: Secondary | ICD-10-CM

## 2016-05-27 NOTE — L&D Delivery Note (Addendum)
25yo [id number] presented 2 days ago with frequent contractions and a hx of preterm labor. No 17-P during this pregnancy. She was given steroids, terb and procardia with IVF, ampicillin for GBS ppx. Today her contractions and pain increased and she was found to be laboring. Her ampicillin, which had been held during the day, was restarted 3 hrs prior to delivery.  Delivery Note  First Stage: Labor onset: 08/14/16 Augmentation : None Analgesia Eliezer Lofts/Anesthesia intrapartum: Epidural AROM at 08/16/16 0054  Second Stage: Complete dilation at 08/16/16 0054 Onset of pushing at 08/16/16 0054 FHR second stage 155  Delivery of a viable female infant "Niaami" at 12:55AM  in OA position without nuchal cord Cord double clamped after cessation of pulsation, cut by FOB Cord blood sample collected   Arterial cord blood sample collected   Third Stage: Placenta delivered promptely intact with 3 VC Placenta disposition: to pathology Uterine tone fimr / bleeding minimal  Left periclitoral laceration identified Repair not needed- small and no bleeding Est. Blood Loss (mL): 200  Complications: PreTerm Labor Placenta appeared intact with dark old clot around the edges. Sent to pathology.  Mom to postpartum.  Baby to skin to skin, and then nursery.  Newborn: Birth Weight: 1870, 4#2oz  Apgar Scores: 8 and 9 Feeding planned: bottle   Geet Hosking 08/16/2016, 1:12 AM

## 2016-06-08 ENCOUNTER — Observation Stay
Admission: EM | Admit: 2016-06-08 | Discharge: 2016-06-08 | Disposition: A | Discharge status: Home / Self Care | Payer: Medicaid Other | Attending: Obstetrics and Gynecology | Admitting: Obstetrics and Gynecology

## 2016-06-08 DIAGNOSIS — O26892 Other specified pregnancy related conditions, second trimester: Principal | ICD-10-CM | POA: Insufficient documentation

## 2016-06-08 DIAGNOSIS — Z3A24 24 weeks gestation of pregnancy: Secondary | ICD-10-CM | POA: Insufficient documentation

## 2016-06-08 DIAGNOSIS — F329 Major depressive disorder, single episode, unspecified: Secondary | ICD-10-CM | POA: Insufficient documentation

## 2016-06-08 DIAGNOSIS — D573 Sickle-cell trait: Secondary | ICD-10-CM | POA: Diagnosis not present

## 2016-06-08 DIAGNOSIS — R112 Nausea with vomiting, unspecified: Secondary | ICD-10-CM | POA: Diagnosis not present

## 2016-06-08 DIAGNOSIS — O99342 Other mental disorders complicating pregnancy, second trimester: Secondary | ICD-10-CM | POA: Insufficient documentation

## 2016-06-08 MED ORDER — SODIUM CHLORIDE FLUSH 0.9 % IV SOLN
INTRAVENOUS | Status: AC
  Filled 2016-06-08: qty 10

## 2016-06-08 MED ORDER — SODIUM CHLORIDE 0.9 % IV SOLN: 25.0000 mg | Freq: Once | INTRAVENOUS | Status: DC

## 2016-06-08 MED ORDER — ONDANSETRON HCL 4 MG/2ML IJ SOLN: 4.0000 mg | Freq: Four times a day (QID) | INTRAMUSCULAR | Status: DC | PRN

## 2016-06-08 MED ORDER — LOPERAMIDE HCL 2 MG PO CAPS
2.0000 mg | ORAL_CAPSULE | ORAL | Status: DC | PRN
  Administered 2016-06-08: 2 mg via ORAL

## 2016-06-08 MED ORDER — DEXTROSE IN LACTATED RINGERS 5 % IV SOLN
INTRAVENOUS | Status: DC
  Administered 2016-06-08: 02:00:00 via INTRAVENOUS

## 2016-06-08 MED ORDER — SODIUM CHLORIDE 0.9 % IV SOLN
INTRAVENOUS | Status: DC
  Filled 2016-06-08 (×26): qty 50

## 2016-06-08 MED ORDER — SODIUM CHLORIDE 0.9 % IV SOLN
Freq: Once | INTRAVENOUS | Status: AC
  Administered 2016-06-08: 02:00:00 via INTRAVENOUS
  Filled 2016-06-08: qty 50

## 2016-06-08 MED ORDER — LOPERAMIDE HCL 2 MG PO CAPS
ORAL_CAPSULE | ORAL | Status: AC
  Administered 2016-06-08: 2 mg via ORAL
  Filled 2016-06-08: qty 1

## 2016-06-08 NOTE — Progress Notes (Signed)
Patient ID: Casey Owens Sachdeva, female   DOB: 1990-10-09, 26 y.o.   MRN: [id number] Casey Owens Bruso 1990-10-09 G4 P1203  5973w1d presents for acute onset N/V and diarrhea starting last pm Ate [employer] , No one else got sick . 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 Some tightening . Marland Kitchen.  PHX; + sickle cell trait  Pshx : none  ROS : + std ,  Pobx : PTD,  Depression Pshx:  No LOF , no  vaginal bleeding , O;BP (!) 107/51   Pulse (!) 52   Temp 98.6 F (37 C) (Oral)   Resp 16   Ht 5\' 4"  (1.626 m)   Wt 123 lb (55.8 kg)   LMP  (LMP Unknown)   BMI 21.11 kg/m  ABD soft , Nl BS , non tender  CX closed / 50 %/ blt  NSTreassuring for 24 weeks  Labs: none A: Viral GE PObserved overnight and t received IVF and IV phenergan + imodium  D/c home with PO =PR phenergan and zofran + OTC IModium .  Push fluids, small solid  RTC if not improving in 48 hrs

## 2016-06-08 NOTE — Discharge Summary (Signed)
  Patient ID: Casey Owens Antwine, female   DOB: [birth year], [age] y.o.   MRN: [id number] Casey Owens Butrick [birth year] G4 P1203  652w1d presents for acute onset N/V and diarrhea starting last pm Ate [employer] , No one else got sick . 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 Some tightening . Marland Kitchen.  PHX; + sickle cell trait  Pshx : none  ROS : + std ,  Pobx : PTD,  Depression Pshx:  No LOF , no  vaginal bleeding , O;BP (!) 107/51   Pulse (!) 52   Temp 98.6 F (37 C) (Oral)   Resp 16   Ht 5\' 4"  (1.626 m)   Wt 123 lb (55.8 kg)   LMP  (LMP Unknown)   BMI 21.11 kg/m  ABD soft , Nl BS , non tender  CX closed / 50 %/ blt  NSTreassuring for 24 weeks  Labs: none A: Viral GE PObserved overnight and t received IVF and IV phenergan + imodium  D/c home with PO =PR phenergan and zofran + OTC IModium .  Push fluids, small solid  RTC if not improving in 48 hrs      Electronically signed by Suzy Bouchardhomas J Schermerhorn, MD at 06/08/2016 9:19 AM

## 2016-06-08 NOTE — Progress Notes (Signed)
Dr Feliberto GottronSchermerhorn called. Informed of no vomiting since phenergan administration. However pt feels unready to go home, still having abd pain with movement and continued nausea. Order received to d/c efm and toco, MD will assess pt in am rounds.

## 2016-06-08 NOTE — Discharge Instructions (Signed)
If symptoms of nausea, vomiting and diarrhea do not improve in 2 days, come back or call the on call provider.  Use prescription zofran and phenergan as prescribed for nausea and vomiting.  Over the counter Immodium as directed for diarrhea.  You may return to work on  06/10/16.  [facility].  If you have any questions or concerns please call the on call provider.  You may also call the [facility] nurses' desk at [phone] for questions.  If you have urgent concerns please go to the nearest Emergency Department for evaluation.

## 2016-06-08 NOTE — OB Triage Note (Signed)
Pt presents to L&D with c/o nausea vomiting and diarrhea since 9pm after eating at [employer]. Pt reports abd pain and feeling like the baby is balling up. Pt denies vaginal bleeding or LOF, reports good fetal movement. Pt denies any family members being sick and reports other family members were with her and ate what she ate and aren't sick. EFM applied and explained. Plan to monitor fetral and maternal well being and assess pt complaint.

## 2016-07-13 ENCOUNTER — Inpatient Hospital Stay
Admission: EM | Admit: 2016-07-13 | Discharge: 2016-07-13 | DRG: 781 | Disposition: A | Discharge status: Home / Self Care | Payer: Medicaid Other | Attending: Obstetrics & Gynecology | Admitting: Obstetrics & Gynecology

## 2016-07-13 ENCOUNTER — Inpatient Hospital Stay: Payer: Medicaid Other

## 2016-07-13 DIAGNOSIS — F1410 Cocaine abuse, uncomplicated: Secondary | ICD-10-CM | POA: Diagnosis present

## 2016-07-13 DIAGNOSIS — F17210 Nicotine dependence, cigarettes, uncomplicated: Secondary | ICD-10-CM | POA: Diagnosis present

## 2016-07-13 DIAGNOSIS — R102 Pelvic and perineal pain: Secondary | ICD-10-CM | POA: Diagnosis present

## 2016-07-13 DIAGNOSIS — O26893 Other specified pregnancy related conditions, third trimester: Principal | ICD-10-CM | POA: Diagnosis present

## 2016-07-13 DIAGNOSIS — O4590 Premature separation of placenta, unspecified, unspecified trimester: Secondary | ICD-10-CM

## 2016-07-13 DIAGNOSIS — O99323 Drug use complicating pregnancy, third trimester: Secondary | ICD-10-CM | POA: Diagnosis present

## 2016-07-13 DIAGNOSIS — F418 Other specified anxiety disorders: Secondary | ICD-10-CM | POA: Diagnosis present

## 2016-07-13 DIAGNOSIS — R1030 Lower abdominal pain, unspecified: Secondary | ICD-10-CM | POA: Diagnosis present

## 2016-07-13 DIAGNOSIS — O99333 Smoking (tobacco) complicating pregnancy, third trimester: Secondary | ICD-10-CM | POA: Diagnosis present

## 2016-07-13 DIAGNOSIS — O99343 Other mental disorders complicating pregnancy, third trimester: Secondary | ICD-10-CM | POA: Diagnosis present

## 2016-07-13 DIAGNOSIS — Z3A29 29 weeks gestation of pregnancy: Secondary | ICD-10-CM

## 2016-07-13 LAB — APTT: aPTT: 27 seconds (ref 24–36)

## 2016-07-13 LAB — URINALYSIS, COMPLETE (UACMP) WITH MICROSCOPIC
BILIRUBIN URINE: NEGATIVE
Bacteria, UA: NONE SEEN
Glucose, UA: NEGATIVE mg/dL
Hgb urine dipstick: NEGATIVE
Ketones, ur: 5 mg/dL — AB
NITRITE: NEGATIVE
PH: 8 (ref 5.0–8.0)
Protein, ur: NEGATIVE mg/dL
SPECIFIC GRAVITY, URINE: 1.013 (ref 1.005–1.030)

## 2016-07-13 LAB — CBC WITH DIFFERENTIAL/PLATELET
BASOS PCT: 0 %
Basophils Absolute: 0 10*3/uL (ref 0–0.1)
EOS ABS: 0 10*3/uL (ref 0–0.7)
EOS PCT: 0 %
HEMATOCRIT: 33.9 % — AB (ref 35.0–47.0)
Hemoglobin: 12 g/dL (ref 12.0–16.0)
Lymphocytes Relative: 15 %
Lymphs Abs: 2 10*3/uL (ref 1.0–3.6)
MCH: 33.6 pg (ref 26.0–34.0)
MCHC: 35.6 g/dL (ref 32.0–36.0)
MCV: 94.5 fL (ref 80.0–100.0)
MONOS PCT: 7 %
Monocytes Absolute: 0.9 10*3/uL (ref 0.2–0.9)
Neutro Abs: 10.7 10*3/uL — ABNORMAL HIGH (ref 1.4–6.5)
Neutrophils Relative %: 78 %
PLATELETS: 248 10*3/uL (ref 150–440)
RBC: 3.58 MIL/uL — ABNORMAL LOW (ref 3.80–5.20)
RDW: 13.3 % (ref 11.5–14.5)
WBC: 13.7 10*3/uL — ABNORMAL HIGH (ref 3.6–11.0)

## 2016-07-13 LAB — URINE DRUG SCREEN, QUALITATIVE (ARMC ONLY)
Amphetamines, Ur Screen: NOT DETECTED
BARBITURATES, UR SCREEN: NOT DETECTED
Benzodiazepine, Ur Scrn: NOT DETECTED
CANNABINOID 50 NG, UR ~~LOC~~: POSITIVE — AB
COCAINE METABOLITE, UR ~~LOC~~: NOT DETECTED
MDMA (Ecstasy)Ur Screen: NOT DETECTED
Methadone Scn, Ur: NOT DETECTED
OPIATE, UR SCREEN: NOT DETECTED
Phencyclidine (PCP) Ur S: NOT DETECTED
Tricyclic, Ur Screen: NOT DETECTED

## 2016-07-13 LAB — TYPE AND SCREEN
ABO/RH(D): O POS
ANTIBODY SCREEN: NEGATIVE

## 2016-07-13 LAB — CHLAMYDIA/NGC RT PCR (ARMC ONLY)
Chlamydia Tr: NOT DETECTED
N GONORRHOEAE: NOT DETECTED

## 2016-07-13 LAB — PROTIME-INR
INR: 0.93
PROTHROMBIN TIME: 12.5 s (ref 11.4–15.2)

## 2016-07-13 LAB — FIBRINOGEN: Fibrinogen: 556 mg/dL — ABNORMAL HIGH (ref 210–475)

## 2016-07-13 LAB — FIBRIN DEGRADATION PROD.(ARMC ONLY): Fibrin Degradation Prod.: <10 (ref <10)

## 2016-07-13 MED ORDER — LACTATED RINGERS IV SOLN
INTRAVENOUS | Status: DC
  Administered 2016-07-13: 14:00:00 via INTRAVENOUS

## 2016-07-13 MED ORDER — ONDANSETRON HCL 4 MG/2ML IJ SOLN
INTRAMUSCULAR | Status: AC
  Administered 2016-07-13: 4 mg
  Filled 2016-07-13: qty 2

## 2016-07-13 MED ORDER — LACTATED RINGERS IV BOLUS (SEPSIS)
1000.0000 mL | Freq: Once | INTRAVENOUS | Status: AC
  Administered 2016-07-13: 1000 mL via INTRAVENOUS

## 2016-07-13 MED ORDER — PRENATAL MULTIVITAMIN CH
1.0000 | ORAL_TABLET | Freq: Every day | ORAL | Status: DC
Start: 2016-07-14 — End: 2016-07-13

## 2016-07-13 MED ORDER — ACETAMINOPHEN 325 MG PO TABS: 650.0000 mg | ORAL_TABLET | ORAL | Status: DC | PRN

## 2016-07-13 MED ORDER — ACETAMINOPHEN 325 MG PO TABS
650.0000 mg | ORAL_TABLET | ORAL | Status: DC | PRN
  Administered 2016-07-13: 650 mg via ORAL

## 2016-07-13 MED ORDER — DOCUSATE SODIUM 100 MG PO CAPS: 100.0000 mg | ORAL_CAPSULE | Freq: Every day | ORAL | Status: DC

## 2016-07-13 MED ORDER — TERBUTALINE SULFATE 1 MG/ML IJ SOLN
INTRAMUSCULAR | Status: AC
  Administered 2016-07-13: 0.25 mg via SUBCUTANEOUS
  Filled 2016-07-13: qty 1

## 2016-07-13 MED ORDER — ONDANSETRON HCL 4 MG/2ML IJ SOLN: 4.0000 mg | Freq: Four times a day (QID) | INTRAMUSCULAR | Status: DC | PRN

## 2016-07-13 MED ORDER — TERBUTALINE SULFATE 1 MG/ML IJ SOLN: 0.2500 mg | Freq: Once | INTRAMUSCULAR | Status: DC

## 2016-07-13 MED ORDER — ZOLPIDEM TARTRATE 5 MG PO TABS: 5.0000 mg | ORAL_TABLET | Freq: Every evening | ORAL | Status: DC | PRN

## 2016-07-13 MED ORDER — CALCIUM CARBONATE ANTACID 500 MG PO CHEW: 2.0000 | CHEWABLE_TABLET | ORAL | Status: DC | PRN

## 2016-07-13 MED ORDER — TERBUTALINE SULFATE 1 MG/ML IJ SOLN
INTRAMUSCULAR | Status: AC
  Administered 2016-07-13: 0.25 mg
  Filled 2016-07-13: qty 1

## 2016-07-13 MED ORDER — BETAMETHASONE SOD PHOS & ACET 6 (3-3) MG/ML IJ SUSP
12.0000 mg | INTRAMUSCULAR | Status: DC
  Administered 2016-07-13: 12 mg via INTRAMUSCULAR
  Filled 2016-07-13: qty 2

## 2016-07-13 MED ORDER — BETAMETHASONE SOD PHOS & ACET 6 (3-3) MG/ML IJ SUSP
12.0000 mg | Freq: Once | INTRAMUSCULAR | Status: DC
  Filled 2016-07-13: qty 2

## 2016-07-13 MED ORDER — TERBUTALINE SULFATE 1 MG/ML IJ SOLN
0.2500 mg | Freq: Once | INTRAMUSCULAR | Status: AC
  Administered 2016-07-13: 0.25 mg via SUBCUTANEOUS

## 2016-07-13 MED ORDER — ACETAMINOPHEN 325 MG PO TABS
ORAL_TABLET | ORAL | Status: AC
  Administered 2016-07-13: 650 mg via ORAL
  Filled 2016-07-13: qty 2

## 2016-07-13 MED ORDER — SODIUM CHLORIDE 0.9% FLUSH: 3.0000 mL | Freq: Two times a day (BID) | INTRAVENOUS | Status: DC

## 2016-07-13 MED ORDER — BETAMETHASONE SOD PHOS & ACET 6 (3-3) MG/ML IJ SUSP
INTRAMUSCULAR | Status: AC
  Administered 2016-07-13: 12 mg via INTRAMUSCULAR
  Filled 2016-07-13: qty 1

## 2016-07-13 NOTE — H&P (Signed)
Dmtrieve Gentry RochS Ruest is a 26 y.o. female presenting for lower abd pain that is intermittant and woke her up at 0100am. Pt arrived per EMS breathing loudly and in pain. Denies any recreational drugs, no sex in the past 24 hours, no vaginal bleeding, Pt has has prior Pre-term deliveries.  OB History    Gravida Para Term Preterm AB Living   4 3 1 2   3    SAB TAB Ectopic Multiple Live Births         0 3     Past Medical History:  Diagnosis Date  . Asthma   . 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 NO PAST SURGERIES     Family History: family history is not on file. Social History:  reports that she has been smoking Cigarettes.  She has been smoking about 0.25 packs per day. She has never used smokeless tobacco. She reports that she uses drugs, including Cocaine and Marijuana. She reports that she does not drink alcohol. Pt states she has not used drugs in the recent few months of pregnancy.     Maternal Diabetes:  Genetic Screening: Maternal Ultrasounds/Referrals: Fetal Ultrasounds or other Referrals:   Maternal Substance Abuse:  Significant Maternal Medications:   Significant Maternal Lab Results:   Other Comments:    Review of Systems  Constitutional: Negative.   HENT: Negative.   Eyes: Negative.   Respiratory: Negative.   Cardiovascular: Negative.   Gastrointestinal: Negative.   Genitourinary: Negative.   Musculoskeletal: Negative.   Skin: Negative.   Neurological: Negative.   Endo/Heme/Allergies: Negative.   Psychiatric/Behavioral: Negative.   +lower abd pain History   Height 5\' 4"  (1.626 m), weight 58.5 kg (129 lb), unknown if currently breastfeeding. Exam  Gen:25 yo black female crying and writhing on admission and has calmed down now and sleeping at intervals.  Physical Exam  Cx:1/50%/ unknown presenting part Prenatal labs: ABO, Rh:  O pos Antibody:   Neg Rubella:  Immune RPR:   NR HBsAg:    HIV:   Non-reactive  GBS:   Not done Sickle cell trait: +trait Assessment/Plan: A:IUP at 29 weeks with lower abd pain 2. Cannot rule out abruption (US per Dr Elesa MassedWard and no abruption signs noted). 3. Substance Abuse issues in the past 4. Social stressors P:1. Continue to observe. 2. Terb x 1 given  3. BMZ 1st dose given. Will repeat in 24 hours.     Sharee Pimplearon W Trev Boley 07/13/2016, 5:09 AM

## 2016-07-13 NOTE — Discharge Summary (Signed)
Obstetric Discharge Summary Reason for Admission: pelvic pain Prenatal Procedures: US Intrapartum Procedures: None Postpartum Procedures:None Complications-Operative and Postpartum: none Hemoglobin  Date Value Ref Range Status  07/13/2016 12.0 12.0 - 16.0 g/dL Final   HGB  Date Value Ref Range Status  08/19/2013 14.0 12.0 - 16.0 g/dL Final   HCT  Date Value Ref Range Status  07/13/2016 33.9 (L) 35.0 - 47.0 % Final  08/19/2013 41.2 35.0 - 47.0 % Final    Physical Exam:  General:A,A&O x 3 Lochia:None Uterine Fundus:Gravid DVT Evaluation: Neg Homans  Discharge Diagnoses: IUP at 29 weeks with lower pelvic pain  Discharge Information: Date: 07/13/2016 Activity: Up ad lib Diet:regular Medications:PNV Condition: stable Instructions:REst, FU with worsening of S/S Discharge ZO:XWRUto:Home   Newborn Data: This patient has no babies on file.   Casey Owens W Casey Owens 07/13/2016, 6:57 PM

## 2016-07-13 NOTE — OB Triage Note (Signed)
Pt arrive to unit via EMS with complaints of severe lower abdominal pain. Denies bleeding or leaking of fluid. +FM. Pt evaluated by MD and CNM. Pt given BMZ and terbutaline. Pt was resting comfortably until about 6:30am. Pt stated at around 7am that she was feeling her contractions once again.

## 2016-07-13 NOTE — Procedures (Signed)
Ultrasound performed on emergent basis  AFI grossly adequate Fetus with appropriate cardiac activity in cephalic/oblique position Fundal placenta with no evidence of hemorrhage, although cotyledons discreetly visible.  With color doppler there is no evidence of extravasation behind placenta.  Area of pain per patient does not correlate with placenta location in uterus.  Impression:  No acute hemorrhage consistent with placental abruption noted. ----- Ranae Plumberhelsea Kaileena Obi, MD Attending Obstetrician and Gynecologist [facility], Department of OB/GYN [facility]

## 2016-07-13 NOTE — Progress Notes (Signed)
Pt approaches nurses station, tearful and upset asking to be discharged. When asking patient what is wrong she stated "my 26 year old is sick and at her grandparents house, they just called and said that I need to come pick her up". Asked patient to return to her room, to further discuss what was going on. Patient states that her and FOB had a fallen out and it is his mother who is watching their daughter, who just called saying that she needed to come pick up their child. When inquiring about transportation she stated that "my car is in the parking lot, I am ok to drive". Milon Scorearon Jones CNM notified of situation, who gave order to D/C patient, but that she needed to assure that she was able to arrange transportation in order to receive her second dose of betamethasone shot. Patient stated that she would be able to be here by 8am to receive 2nd dose. Patient was escorted out with RN and security, to locate car in parking lot. Shirlean KellyJennifer Tashera Montalvo RN
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
## 2016-07-14 ENCOUNTER — Inpatient Hospital Stay
Admission: RE | Admit: 2016-07-14 | Discharge: 2016-07-14 | Disposition: A | Discharge status: Home / Self Care | Payer: Medicaid Other | Attending: Obstetrics and Gynecology | Admitting: Obstetrics and Gynecology

## 2016-07-14 DIAGNOSIS — O4700 False labor before 37 completed weeks of gestation, unspecified trimester: Secondary | ICD-10-CM | POA: Diagnosis not present

## 2016-07-14 DIAGNOSIS — Z3A00 Weeks of gestation of pregnancy not specified: Secondary | ICD-10-CM | POA: Diagnosis not present

## 2016-07-14 LAB — URINE CULTURE: Culture: <10000 — AB

## 2016-07-14 MED ORDER — BETAMETHASONE SOD PHOS & ACET 6 (3-3) MG/ML IJ SUSP
12.0000 mg | Freq: Once | INTRAMUSCULAR | Status: AC
  Administered 2016-07-14: 12 mg via INTRAMUSCULAR

## 2016-07-17 ENCOUNTER — Observation Stay
Admission: EM | Admit: 2016-07-17 | Discharge: 2016-07-17 | Disposition: A | Discharge status: Home / Self Care | Payer: Medicaid Other | Attending: Obstetrics and Gynecology | Admitting: Obstetrics and Gynecology

## 2016-07-17 ENCOUNTER — Encounter: Payer: Self-pay | Admitting: *Deleted

## 2016-07-17 DIAGNOSIS — Z3A29 29 weeks gestation of pregnancy: Secondary | ICD-10-CM | POA: Insufficient documentation

## 2016-07-17 DIAGNOSIS — O26899 Other specified pregnancy related conditions, unspecified trimester: Secondary | ICD-10-CM | POA: Diagnosis present

## 2016-07-17 DIAGNOSIS — O26893 Other specified pregnancy related conditions, third trimester: Secondary | ICD-10-CM | POA: Diagnosis not present

## 2016-07-17 DIAGNOSIS — R109 Unspecified abdominal pain: Secondary | ICD-10-CM | POA: Diagnosis not present

## 2016-07-17 DIAGNOSIS — R102 Pelvic and perineal pain unspecified side: Secondary | ICD-10-CM | POA: Diagnosis present

## 2016-07-17 DIAGNOSIS — E876 Hypokalemia: Secondary | ICD-10-CM | POA: Insufficient documentation

## 2016-07-17 DIAGNOSIS — K5900 Constipation, unspecified: Secondary | ICD-10-CM | POA: Insufficient documentation

## 2016-07-17 HISTORY — DX: Major depressive disorder, single episode, unspecified: F32.9

## 2016-07-17 HISTORY — DX: Depression, unspecified: F32.A

## 2016-07-17 LAB — URINALYSIS, ROUTINE W REFLEX MICROSCOPIC
BACTERIA UA: NONE SEEN
BILIRUBIN URINE: NEGATIVE
Glucose, UA: NEGATIVE mg/dL
Hgb urine dipstick: NEGATIVE
KETONES UR: NEGATIVE mg/dL
Nitrite: NEGATIVE
Protein, ur: NEGATIVE mg/dL
Specific Gravity, Urine: 1.02 (ref 1.005–1.030)
pH: 6 (ref 5.0–8.0)

## 2016-07-17 LAB — COMPREHENSIVE METABOLIC PANEL
ALBUMIN: 2.6 g/dL — AB (ref 3.5–5.0)
ALK PHOS: 112 U/L (ref 38–126)
ALT: 15 U/L (ref 14–54)
ANION GAP: 8 (ref 5–15)
AST: 20 U/L (ref 15–41)
BUN: 7 mg/dL (ref 6–20)
CO2: 19 mmol/L — AB (ref 22–32)
Calcium: 8.3 mg/dL — ABNORMAL LOW (ref 8.9–10.3)
Chloride: 108 mmol/L (ref 101–111)
Creatinine, Ser: 0.43 mg/dL — ABNORMAL LOW (ref 0.44–1.00)
GFR calc Af Amer: >60 mL/min (ref >60)
GFR calc non Af Amer: >60 mL/min (ref >60)
GLUCOSE: 73 mg/dL (ref 65–99)
Potassium: 3 mmol/L — ABNORMAL LOW (ref 3.5–5.1)
SODIUM: 135 mmol/L (ref 135–145)
Total Bilirubin: 0.7 mg/dL (ref 0.3–1.2)
Total Protein: 6.2 g/dL — ABNORMAL LOW (ref 6.5–8.1)

## 2016-07-17 LAB — CBC
HCT: 30 % — ABNORMAL LOW (ref 35.0–47.0)
HEMOGLOBIN: 10.8 g/dL — AB (ref 12.0–16.0)
MCH: 34 pg (ref 26.0–34.0)
MCHC: 36.1 g/dL — AB (ref 32.0–36.0)
MCV: 94.2 fL (ref 80.0–100.0)
Platelets: 249 10*3/uL (ref 150–440)
RBC: 3.18 MIL/uL — ABNORMAL LOW (ref 3.80–5.20)
RDW: 13.2 % (ref 11.5–14.5)
WBC: 16.2 10*3/uL — AB (ref 3.6–11.0)

## 2016-07-17 MED ORDER — LACTATED RINGERS IV SOLN
INTRAVENOUS | Status: DC
  Administered 2016-07-17: 17:00:00 via INTRAVENOUS

## 2016-07-17 MED ORDER — POTASSIUM CHLORIDE CRYS ER 20 MEQ PO TBCR
20.0000 meq | EXTENDED_RELEASE_TABLET | Freq: Once | ORAL | Status: AC
  Administered 2016-07-17: 20 meq via ORAL
  Filled 2016-07-17: qty 1

## 2016-07-17 MED ORDER — BISACODYL 5 MG PO TBEC
10.0000 mg | DELAYED_RELEASE_TABLET | Freq: Once | ORAL | Status: AC
Start: 2016-07-17 — End: 2016-07-17
  Administered 2016-07-17: 10 mg via ORAL
  Filled 2016-07-17 (×3): qty 2

## 2016-07-17 MED ORDER — ALBUTEROL SULFATE (2.5 MG/3ML) 0.083% IN NEBU
2.5000 mg | INHALATION_SOLUTION | Freq: Once | RESPIRATORY_TRACT | Status: AC
  Administered 2016-07-17: 2.5 mg via RESPIRATORY_TRACT
  Filled 2016-07-17: qty 3

## 2016-07-17 MED ORDER — FLEET ENEMA 7-19 GM/118ML RE ENEM
1.0000 | ENEMA | Freq: Once | RECTAL | Status: AC
  Administered 2016-07-17: 1 via RECTAL

## 2016-07-17 NOTE — Progress Notes (Signed)
..  [name] 12-15-1992CC: G4 P1203 387w5d presents for pelvic pressure, constipation and ctx . Seen over weekend and was keep over night for pain . Cx at that time 1cm / 50%/ OOP .She did receive steroids then  States she normally has BM daily , but recently q 2-7 days. Drinks sodas and not enough water . 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 No ETOH , No tobacco  Family hx :no h/o genetic defects  ROS: prior preterm deliveries  O;BP 101/65 (BP Location: Left Arm)   Pulse 75   Temp 98.5 F (36.9 C) (Oral)   Resp 20   Ht 5\' 4"  (1.626 m)   Wt 129 lb (58.5 kg)   LMP  (LMP Unknown)   SpO2 99%   BMI 22.14 kg/m   Lungs CTA  CV RRR ABDsoft NT  CX 1cm / 50% OOP VTX NSTreactive , uterine irritability  Labs:   Sodium 135 - 145 mmol/L 135  135  133   134   140     Potassium 3.5 - 5.1 mmol/L 3.0   3.8  3.2   3.8  3.9    Chloride 101 - 111 mmol/L 108  109  104  106  109    CO2 22 - 32 mmol/L 19   21   20   21   24     Glucose, Bld 65 - 99 mg/dL 73  88  71  78  161100     BUN 6 - 20 mg/dL 7  5   6  9  11     Creatinine, Ser 0.44 - 1.00 mg/dL 0.960.43   0.450.63  4.090.66  8.110.63  0.79    Calcium 8.9 - 10.3 mg/dL 8.3   8.4   8.9  8.9  9.0    Total Protein 6.5 - 8.1 g/dL 6.2   5.9   7.2  6.9  7.2    Albumin 3.5 - 5.0 g/dL 2.6   2.6   3.4   3.4   4.0    AST 15 - 41 U/L 20  20  19  16  21     ALT 14 - 54 U/L 15  14  15  13   16     Alkaline Phosphatase 38 - 126 U/L 112  65  81  56  67    Total Bilirubin 0.3 - 1.2 mg/dL 0.7  0.7  1.2  1.1  1.2    GFR calc non Af Amer >60 mL/min >60  >60  >60  >60  >60    GFR calc Af Amer >60 mL/min >60         A: pelvic pressure related to uterine irritability, constipation .No cervical change  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 Increase fiber . Recommend  decreasing work hours if she can . RTC if increasing pain , CTX , LOF , vag bleeding . She should make afollow up with the HEalth dept and see social worker for help with job.   Patient ID: Casey Owens, female   DOB: 05-10-1991, 26 y.o.   MRN: [id number]

## 2016-07-17 NOTE — Discharge Summary (Signed)
  Suzy Bouchardhomas J Stacy Sailer, MD  Obstetrics    [] Hide copied text .Marland Kitchen.[name] [birth year]CC: G4 P1203 6428w5d presents for pelvic pressure, constipation and ctx . Seen over weekend and was keep over night for pain . Cx at that time 1cm / 50%/ OOP .She did receive steroids then  States she normally has BM daily , but recently q 2-7 days. Drinks sodas and not enough water . 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 No ETOH , No tobacco  Family hx :no h/o genetic defects  ROS: prior preterm deliveries  O;BP 101/65 (BP Location: Left Arm)   Pulse 75   Temp 98.5 F (36.9 C) (Oral)   Resp 20   Ht 5\' 4"  (1.626 m)   Wt 129 lb (58.5 kg)   LMP  (LMP Unknown)   SpO2 99%   BMI 22.14 kg/m   Lungs CTA  CV RRR ABDsoft NT  CX 1cm / 50% OOP VTX NSTreactive , uterine irritability  Labs:   Sodium 135 - 145 mmol/L 135  135  133   134   140     Potassium 3.5 - 5.1 mmol/L 3.0   3.8  3.2   3.8  3.9    Chloride 101 - 111 mmol/L 108  109  104  106  109    CO2 22 - 32 mmol/L 19   21   20   21   24     Glucose, Bld 65 - 99 mg/dL 73  88  71  78  161100     BUN 6 - 20 mg/dL 7  5   6  9  11     Creatinine, Ser 0.44 - 1.00 mg/dL 0.960.43   0.450.63  4.090.66  8.110.63  0.79    Calcium 8.9 - 10.3 mg/dL 8.3   8.4   8.9  8.9  9.0    Total Protein 6.5 - 8.1 g/dL 6.2   5.9   7.2  6.9  7.2    Albumin 3.5 - 5.0 g/dL 2.6   2.6   3.4   3.4   4.0    AST 15 - 41 U/L 20  20  19  16  21     ALT 14 - 54 U/L 15  14  15  13   16     Alkaline Phosphatase 38 - 126 U/L 112  65  81  56  67    Total Bilirubin 0.3 - 1.2 mg/dL 0.7  0.7  1.2  1.1  1.2    GFR calc non Af Amer >60 mL/min >60  >60  >60  >60  >60    GFR calc Af Amer >60 mL/min >60         A: pelvic pressure related to uterine irritability, constipation .No cervical change  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 Increase fiber . Recommend decreasing work hours if she can . RTC if increasing pain , CTX , LOF , vag bleeding . She should make afollow up with the HEalth dept and see social worker for help with job.  increase K+ intake   Ihor Austinhomas J SchermerhornMD

## 2016-07-17 NOTE — OB Triage Note (Signed)
Abdominal pain since 2000 last pm. Denies vaginal bleeding. Denies LOF. Casey Owens, [name]

## 2016-07-24 NOTE — Progress Notes (Signed)
Patient ID: Casey Owens, female   DOB: November 02, 1990, 26 y.o.   MRN: [id number]  Pt arrived for 2nd Betamethasone due to PTL S/S and requring 2 doses 24 hours apart.  Sharee Pimplearon W. JOnes, MSN, CNM, FNP

## 2016-08-14 ENCOUNTER — Encounter: Payer: Self-pay | Admitting: *Deleted

## 2016-08-14 ENCOUNTER — Inpatient Hospital Stay
Admission: EM | Admit: 2016-08-14 | Discharge: 2016-08-17 | DRG: 774 | Disposition: A | Discharge status: Home / Self Care | Payer: Medicaid Other | Attending: Obstetrics and Gynecology | Admitting: Obstetrics and Gynecology

## 2016-08-14 DIAGNOSIS — I498 Other specified cardiac arrhythmias: Secondary | ICD-10-CM | POA: Diagnosis not present

## 2016-08-14 DIAGNOSIS — O99334 Smoking (tobacco) complicating childbirth: Secondary | ICD-10-CM | POA: Diagnosis present

## 2016-08-14 DIAGNOSIS — J45909 Unspecified asthma, uncomplicated: Secondary | ICD-10-CM | POA: Diagnosis present

## 2016-08-14 DIAGNOSIS — D72829 Elevated white blood cell count, unspecified: Secondary | ICD-10-CM | POA: Diagnosis not present

## 2016-08-14 DIAGNOSIS — O9943 Diseases of the circulatory system complicating the puerperium: Secondary | ICD-10-CM | POA: Diagnosis not present

## 2016-08-14 DIAGNOSIS — Z79899 Other long term (current) drug therapy: Secondary | ICD-10-CM

## 2016-08-14 DIAGNOSIS — Z91013 Allergy to seafood: Secondary | ICD-10-CM

## 2016-08-14 DIAGNOSIS — F1490 Cocaine use, unspecified, uncomplicated: Secondary | ICD-10-CM | POA: Diagnosis present

## 2016-08-14 DIAGNOSIS — O9952 Diseases of the respiratory system complicating childbirth: Secondary | ICD-10-CM | POA: Diagnosis present

## 2016-08-14 DIAGNOSIS — F1290 Cannabis use, unspecified, uncomplicated: Secondary | ICD-10-CM | POA: Diagnosis present

## 2016-08-14 DIAGNOSIS — O99344 Other mental disorders complicating childbirth: Secondary | ICD-10-CM | POA: Diagnosis present

## 2016-08-14 DIAGNOSIS — I491 Atrial premature depolarization: Secondary | ICD-10-CM | POA: Diagnosis not present

## 2016-08-14 DIAGNOSIS — O9902 Anemia complicating childbirth: Secondary | ICD-10-CM | POA: Diagnosis present

## 2016-08-14 DIAGNOSIS — I493 Ventricular premature depolarization: Secondary | ICD-10-CM | POA: Diagnosis not present

## 2016-08-14 DIAGNOSIS — O99324 Drug use complicating childbirth: Secondary | ICD-10-CM | POA: Diagnosis present

## 2016-08-14 DIAGNOSIS — Y92230 Patient room in hospital as the place of occurrence of the external cause: Secondary | ICD-10-CM | POA: Diagnosis not present

## 2016-08-14 DIAGNOSIS — Z3A33 33 weeks gestation of pregnancy: Secondary | ICD-10-CM

## 2016-08-14 DIAGNOSIS — T380X5A Adverse effect of glucocorticoids and synthetic analogues, initial encounter: Secondary | ICD-10-CM | POA: Diagnosis not present

## 2016-08-14 DIAGNOSIS — Z8659 Personal history of other mental and behavioral disorders: Secondary | ICD-10-CM

## 2016-08-14 DIAGNOSIS — O9912 Other diseases of the blood and blood-forming organs and certain disorders involving the immune mechanism complicating childbirth: Secondary | ICD-10-CM | POA: Diagnosis present

## 2016-08-14 DIAGNOSIS — F17210 Nicotine dependence, cigarettes, uncomplicated: Secondary | ICD-10-CM | POA: Diagnosis present

## 2016-08-14 DIAGNOSIS — R011 Cardiac murmur, unspecified: Secondary | ICD-10-CM | POA: Diagnosis not present

## 2016-08-14 DIAGNOSIS — D573 Sickle-cell trait: Secondary | ICD-10-CM | POA: Diagnosis present

## 2016-08-14 DIAGNOSIS — Z915 Personal history of self-harm: Secondary | ICD-10-CM

## 2016-08-14 LAB — URINALYSIS, ROUTINE W REFLEX MICROSCOPIC
BACTERIA UA: NONE SEEN
Bilirubin Urine: NEGATIVE
GLUCOSE, UA: NEGATIVE mg/dL
HGB URINE DIPSTICK: NEGATIVE
KETONES UR: NEGATIVE mg/dL
Nitrite: NEGATIVE
PROTEIN: NEGATIVE mg/dL
Specific Gravity, Urine: 1.018 (ref 1.005–1.030)
pH: 6 (ref 5.0–8.0)

## 2016-08-14 LAB — URINE DRUG SCREEN, QUALITATIVE (ARMC ONLY)
Amphetamines, Ur Screen: NOT DETECTED
Barbiturates, Ur Screen: NOT DETECTED
Benzodiazepine, Ur Scrn: NOT DETECTED
CANNABINOID 50 NG, UR ~~LOC~~: POSITIVE — AB
COCAINE METABOLITE, UR ~~LOC~~: NOT DETECTED
MDMA (ECSTASY) UR SCREEN: NOT DETECTED
Methadone Scn, Ur: NOT DETECTED
Opiate, Ur Screen: NOT DETECTED
Phencyclidine (PCP) Ur S: NOT DETECTED
TRICYCLIC, UR SCREEN: NOT DETECTED

## 2016-08-14 LAB — CBC
HCT: 36.3 % (ref 35.0–47.0)
Hemoglobin: 12.3 g/dL (ref 12.0–16.0)
MCH: 32 pg (ref 26.0–34.0)
MCHC: 33.8 g/dL (ref 32.0–36.0)
MCV: 94.5 fL (ref 80.0–100.0)
PLATELETS: 264 10*3/uL (ref 150–440)
RBC: 3.84 MIL/uL (ref 3.80–5.20)
RDW: 13.1 % (ref 11.5–14.5)
WBC: 14.5 10*3/uL — ABNORMAL HIGH (ref 3.6–11.0)

## 2016-08-14 LAB — TYPE AND SCREEN
ABO/RH(D): O POS
Antibody Screen: NEGATIVE

## 2016-08-14 MED ORDER — BUTORPHANOL TARTRATE 1 MG/ML IJ SOLN
1.0000 mg | INTRAMUSCULAR | Status: DC | PRN
  Administered 2016-08-14 (×2): 1 mg via INTRAVENOUS
  Filled 2016-08-14 (×2): qty 1

## 2016-08-14 MED ORDER — OXYTOCIN BOLUS FROM INFUSION: 500.0000 mL | Freq: Once | INTRAVENOUS | Status: DC

## 2016-08-14 MED ORDER — NIFEDIPINE 10 MG PO CAPS
10.0000 mg | ORAL_CAPSULE | Freq: Once | ORAL | Status: AC
  Administered 2016-08-14: 10 mg via ORAL

## 2016-08-14 MED ORDER — LACTATED RINGERS IV SOLN
INTRAVENOUS | Status: DC
  Administered 2016-08-14 – 2016-08-15 (×4): via INTRAVENOUS

## 2016-08-14 MED ORDER — TERBUTALINE SULFATE 1 MG/ML IJ SOLN: 0.2500 mg | Freq: Once | INTRAMUSCULAR | Status: AC

## 2016-08-14 MED ORDER — BETAMETHASONE SOD PHOS & ACET 6 (3-3) MG/ML IJ SUSP
12.0000 mg | INTRAMUSCULAR | Status: AC
  Administered 2016-08-14 – 2016-08-15 (×2): 12 mg via INTRAMUSCULAR

## 2016-08-14 MED ORDER — NIFEDIPINE 10 MG PO CAPS
20.0000 mg | ORAL_CAPSULE | Freq: Once | ORAL | Status: AC
  Administered 2016-08-14: 20 mg via ORAL
  Filled 2016-08-14: qty 2

## 2016-08-14 MED ORDER — NIFEDIPINE 10 MG PO CAPS
20.0000 mg | ORAL_CAPSULE | Freq: Four times a day (QID) | ORAL | Status: DC
  Administered 2016-08-15 (×4): 20 mg via ORAL
  Filled 2016-08-14 (×6): qty 2

## 2016-08-14 MED ORDER — TERBUTALINE SULFATE 1 MG/ML IJ SOLN
INTRAMUSCULAR | Status: AC
  Administered 2016-08-14: 0.25 mg via SUBCUTANEOUS
  Filled 2016-08-14: qty 1

## 2016-08-14 MED ORDER — SODIUM CHLORIDE FLUSH 0.9 % IV SOLN
INTRAVENOUS | Status: AC
  Administered 2016-08-14: 10 mL
  Filled 2016-08-14: qty 20

## 2016-08-14 MED ORDER — SODIUM CHLORIDE 0.9 % IV SOLN
2.0000 g | Freq: Once | INTRAVENOUS | Status: AC
  Administered 2016-08-14: 2 g via INTRAVENOUS
  Filled 2016-08-14: qty 2000

## 2016-08-14 MED ORDER — BETAMETHASONE SOD PHOS & ACET 6 (3-3) MG/ML IJ SUSP
INTRAMUSCULAR | Status: AC
  Administered 2016-08-14: 12 mg via INTRAMUSCULAR
  Filled 2016-08-14: qty 1

## 2016-08-14 MED ORDER — OXYTOCIN 10 UNIT/ML IJ SOLN
INTRAMUSCULAR | Status: AC
  Filled 2016-08-14: qty 2

## 2016-08-14 MED ORDER — ONDANSETRON HCL 4 MG/2ML IJ SOLN
4.0000 mg | Freq: Four times a day (QID) | INTRAMUSCULAR | Status: DC | PRN
  Administered 2016-08-14 (×2): 4 mg via INTRAVENOUS
  Filled 2016-08-14 (×2): qty 2

## 2016-08-14 MED ORDER — TERBUTALINE SULFATE 1 MG/ML IJ SOLN
0.2500 mg | Freq: Once | INTRAMUSCULAR | Status: AC
  Administered 2016-08-14 (×2): 0.25 mg via SUBCUTANEOUS

## 2016-08-14 MED ORDER — TERBUTALINE SULFATE 1 MG/ML IJ SOLN
INTRAMUSCULAR | Status: AC
Start: 2016-08-14 — End: 2016-08-14
  Administered 2016-08-14: 0.25 mg via SUBCUTANEOUS
  Filled 2016-08-14: qty 1

## 2016-08-14 MED ORDER — LACTATED RINGERS IV SOLN: 500.0000 mL | INTRAVENOUS | Status: DC | PRN

## 2016-08-14 MED ORDER — LIDOCAINE HCL (PF) 1 % IJ SOLN
INTRAMUSCULAR | Status: AC
  Filled 2016-08-14: qty 30

## 2016-08-14 MED ORDER — LIDOCAINE HCL (PF) 1 % IJ SOLN: 30.0000 mL | INTRAMUSCULAR | Status: DC | PRN

## 2016-08-14 MED ORDER — SOD CITRATE-CITRIC ACID 500-334 MG/5ML PO SOLN: 30.0000 mL | ORAL | Status: DC | PRN

## 2016-08-14 MED ORDER — SODIUM CHLORIDE 0.9 % IV SOLN
INTRAVENOUS | Status: AC
  Administered 2016-08-14: 1 g via INTRAVENOUS
  Filled 2016-08-14: qty 1000

## 2016-08-14 MED ORDER — AMMONIA AROMATIC IN INHA
RESPIRATORY_TRACT | Status: AC
  Filled 2016-08-14: qty 10

## 2016-08-14 MED ORDER — SODIUM CHLORIDE 0.9 % IV SOLN
1.0000 g | INTRAVENOUS | Status: DC
  Administered 2016-08-14 – 2016-08-15 (×5): 1 g via INTRAVENOUS
  Filled 2016-08-14 (×4): qty 1000

## 2016-08-14 MED ORDER — OXYTOCIN 40 UNITS IN LACTATED RINGERS INFUSION - SIMPLE MED: 2.5000 [IU]/h | INTRAVENOUS | Status: DC

## 2016-08-14 MED ORDER — NIFEDIPINE 10 MG PO CAPS
10.0000 mg | ORAL_CAPSULE | Freq: Four times a day (QID) | ORAL | Status: DC
  Administered 2016-08-14: 10 mg via ORAL
  Filled 2016-08-14 (×2): qty 1

## 2016-08-14 MED ORDER — ACETAMINOPHEN 500 MG PO TABS
1000.0000 mg | ORAL_TABLET | ORAL | Status: DC | PRN
Start: 2016-08-14 — End: 2016-08-15

## 2016-08-14 MED ORDER — MISOPROSTOL 200 MCG PO TABS
ORAL_TABLET | ORAL | Status: AC
  Filled 2016-08-14: qty 4

## 2016-08-14 NOTE — OB Triage Note (Signed)
C/o rectal pressure. Reports having hemorrhoids. C/o lower abdominal pain/pressure. Pain/pressure started approx 0300 this am. Denies any vaginal bleeding. Reports BM this am. Reports having to strain at times to have bm. Elaina HoopsElks, Ferris Tally S

## 2016-08-14 NOTE — H&P (Addendum)
OB History & Physical   History of Present Illness:  Chief Complaint:   HPI:  Casey Owens is a 26 y.o. [phone] female at 7039w5d dated by LMP.  She presents to L&D with rectal pressure and intermittent contractions   She was treated earlier in this pregnancy with BMZ for suspicion for preterm labor, which was treated at 27 weeks   She was 1 cm dilated at that time.  She presents today with recurrent low pelvic pain and found to be 4cm/50% effaced, with uterine irritability.  After observation for 2hrs her cervix changed to 80% effacement.   +FM,+CTX, no LOF, no VB  Pregnancy Issues:  1. History of 2 prior preterm births 2. 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 Previous administration of BMZ 4 weeks ago for suspected PTL 4. Asthma, well-controlled per patient   Maternal Medical History:   Past Medical History:  Diagnosis Date  . Asthma   . Depression   . 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 NO PAST SURGERIES      Allergies  Allergen Reactions  . Shellfish Allergy Swelling    Prior to Admission medications   Medication Sig Start Date End Date Taking? Authorizing Provider  albuterol (ACCUNEB) 0.63 MG/3ML nebulizer solution Take 1 ampule by nebulization every 6 (six) hours as needed for wheezing.   Yes Historical Provider, MD  Prenatal Vit-Fe Fumarate-FA (MULTIVITAMIN-PRENATAL) 27-0.8 MG TABS tablet Take 1 tablet by mouth daily at 12 noon.   Yes Historical Provider, MD  FLUoxetine (PROZAC) 40 MG capsule Take 40 mg by mouth daily.    Historical Provider, MD     [facility]   Social History: She  reports that she has been smoking Cigarettes.  She has a 2.50 pack-year smoking history. She has never used smokeless tobacco. She reports that she uses drugs, including Cocaine and Marijuana. She reports that she does not  drink alcohol.  Family History: family history is not on file.   Review of Systems: A full review of systems was performed and negative except as noted in the HPI.     Physical Exam:  Vital Signs: BP 139/86   Pulse (!) 49   Temp 98.2 F (36.8 C) (Oral)   Resp 18   Ht 5\' 4"  (1.626 m)   Wt 129 lb (58.5 kg)   LMP  (LMP Unknown)   SpO2 100%   Breastfeeding? Unknown   BMI 22.14 kg/m  General: no acute distress.  HEENT: normocephalic, atraumatic Heart: regular rate & rhythm.  No murmurs/rubs/gallops Lungs: clear to auscultation bilaterally, normal respiratory effort Abdomen: soft, gravid, non-tender;  EFW: 4 # Pelvic:   External: Normal external female genitalia  Cervix: Dilation: 4 / Effacement (%): 80 / Station: -1    Extremities: non-tender, symmetric, no edema bilaterally.   Neurologic: Alert & oriented x 3.    Results for orders placed or performed during the hospital encounter of 08/14/16 (from the past 24 hour(s))  CBC     Status: Abnormal   Collection Time: 08/15/16  9:08 PM  Result Value Ref Range   WBC 20.9 (H) 3.6 - 11.0 K/uL   RBC 3.68 (L) 3.80 - 5.20 MIL/uL   Hemoglobin 12.1 12.0 - 16.0 g/dL   HCT 45.434.7 (L) 09.835.0 - 11.947.0 %   MCV 94.1 80.0 - 100.0 fL  MCH 32.8 26.0 - 34.0 pg   MCHC 34.8 32.0 - 36.0 g/dL   RDW 16.1 09.6 - 04.5 %   Platelets 282 150 - 440 K/uL  Comprehensive metabolic panel     Status: Abnormal   Collection Time: 08/15/16  9:08 PM  Result Value Ref Range   Sodium 135 135 - 145 mmol/L   Potassium 3.7 3.5 - 5.1 mmol/L   Chloride 106 101 - 111 mmol/L   CO2 19 (L) 22 - 32 mmol/L   Glucose, Bld 110 (H) 65 - 99 mg/dL   BUN 5 (L) 6 - 20 mg/dL   Creatinine, Ser 4.09 0.44 - 1.00 mg/dL   Calcium 8.9 8.9 - 81.1 mg/dL   Total Protein 6.5 6.5 - 8.1 g/dL   Albumin 2.9 (L) 3.5 - 5.0 g/dL   AST 25 15 - 41 U/L   ALT 17 14 - 54 U/L   Alkaline Phosphatase 141 (H) 38 - 126 U/L   Total Bilirubin 0.8 0.3 - 1.2 mg/dL   GFR calc non Af Amer >60 >60 mL/min    GFR calc Af Amer >60 >60 mL/min   Anion gap 10 5 - 15  Lipase, blood     Status: None   Collection Time: 08/15/16  9:08 PM  Result Value Ref Range   Lipase 23 11 - 51 U/L  Amylase     Status: None   Collection Time: 08/15/16  9:08 PM  Result Value Ref Range   Amylase 91 28 - 100 U/L  ROM Plus ([facility] only)     Status: None   Collection Time: 08/15/16 10:36 PM  Result Value Ref Range   Rom Plus NEGATIVE     Pertinent Results:  Prenatal Labs: Blood type/Rh O positive  Antibody screen neg  Rubella Immune  Varicella Immune  RPR NR  HBsAg Neg  HIV NR  GC neg  Chlamydia neg  Genetic screening negative  1 hour GTT   3 hour GTT   GBS unknown   FHT: 155, mod var, +accels, no decels TOCO: irregular SVE:  Dilation: 40 / Effacement (%): 80 / Station: -1   Hand palpated on first exam, not present for repeat exam, cephalic  Assessment:  Casey Owens is a 26 y.o. [phone] female at [redacted]w[redacted]d with preterm labor.   Plan:  1. Admit to Labor & Delivery 2. CBC, T&S, Clrs, IVF 3. GBS  Unknown - obtain swab and treat with ampicillin  4. Consents obtained. 5. Continuous efm/toco 6. BMZ now for rescue dose due to it being given 12mo ago.  Terbutaline now for cessation of PTL.  If needed will add/do procardia.  Magnesium sulfate not needed due to >32weeks for neuroprotection.   7. NICU consult for preterm delivery possible 8. Explained to patient that she may be in PTL and we can try to stop labor, but she could also progress.  She has had two prior PTD as well.    ----- Ranae Plumber, MD/Bethany Dalbert Garnet, MD - completed H&P Attending Obstetrician and Gynecologist [facility], Department of OB/GYN [facility]
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
## 2016-08-15 ENCOUNTER — Observation Stay: Payer: Medicaid Other | Admitting: Anesthesiology

## 2016-08-15 LAB — COMPREHENSIVE METABOLIC PANEL
ALK PHOS: 141 U/L — AB (ref 38–126)
ALT: 17 U/L (ref 14–54)
ANION GAP: 10 (ref 5–15)
AST: 25 U/L (ref 15–41)
Albumin: 2.9 g/dL — ABNORMAL LOW (ref 3.5–5.0)
BILIRUBIN TOTAL: 0.8 mg/dL (ref 0.3–1.2)
BUN: 5 mg/dL — ABNORMAL LOW (ref 6–20)
CALCIUM: 8.9 mg/dL (ref 8.9–10.3)
CO2: 19 mmol/L — ABNORMAL LOW (ref 22–32)
Chloride: 106 mmol/L (ref 101–111)
Creatinine, Ser: 0.55 mg/dL (ref 0.44–1.00)
Glucose, Bld: 110 mg/dL — ABNORMAL HIGH (ref 65–99)
Potassium: 3.7 mmol/L (ref 3.5–5.1)
SODIUM: 135 mmol/L (ref 135–145)
TOTAL PROTEIN: 6.5 g/dL (ref 6.5–8.1)

## 2016-08-15 LAB — RPR: RPR: NONREACTIVE

## 2016-08-15 LAB — CBC
HCT: 34.7 % — ABNORMAL LOW (ref 35.0–47.0)
HEMOGLOBIN: 12.1 g/dL (ref 12.0–16.0)
MCH: 32.8 pg (ref 26.0–34.0)
MCHC: 34.8 g/dL (ref 32.0–36.0)
MCV: 94.1 fL (ref 80.0–100.0)
Platelets: 282 10*3/uL (ref 150–440)
RBC: 3.68 MIL/uL — AB (ref 3.80–5.20)
RDW: 13.6 % (ref 11.5–14.5)
WBC: 20.9 10*3/uL — ABNORMAL HIGH (ref 3.6–11.0)

## 2016-08-15 LAB — URINE CULTURE

## 2016-08-15 LAB — AMYLASE: AMYLASE: 91 U/L (ref 28–100)

## 2016-08-15 LAB — LIPASE, BLOOD: LIPASE: 23 U/L (ref 11–51)

## 2016-08-15 MED ORDER — ONDANSETRON 4 MG PO TBDP: 4.0000 mg | ORAL_TABLET | Freq: Four times a day (QID) | ORAL | Status: DC | PRN

## 2016-08-15 MED ORDER — NALBUPHINE HCL 10 MG/ML IJ SOLN: 5.0000 mg | Freq: Once | INTRAMUSCULAR | Status: DC | PRN

## 2016-08-15 MED ORDER — PRENATAL MULTIVITAMIN CH
1.0000 | ORAL_TABLET | Freq: Every day | ORAL | Status: DC
  Filled 2016-08-15 (×2): qty 1

## 2016-08-15 MED ORDER — LACTATED RINGERS IV SOLN: INTRAVENOUS | Status: DC

## 2016-08-15 MED ORDER — ONDANSETRON HCL 4 MG/2ML IJ SOLN
4.0000 mg | Freq: Four times a day (QID) | INTRAMUSCULAR | Status: DC
  Administered 2016-08-15: 4 mg via INTRAVENOUS
  Filled 2016-08-15: qty 2

## 2016-08-15 MED ORDER — DIPHENHYDRAMINE HCL 25 MG PO CAPS: 25.0000 mg | ORAL_CAPSULE | ORAL | Status: DC | PRN

## 2016-08-15 MED ORDER — LIDOCAINE-EPINEPHRINE (PF) 1.5 %-1:200000 IJ SOLN
INTRAMUSCULAR | Status: DC | PRN
  Administered 2016-08-15: 3 mL via EPIDURAL

## 2016-08-15 MED ORDER — NALOXONE HCL 2 MG/2ML IJ SOSY: 1.0000 ug/kg/h | PREFILLED_SYRINGE | INTRAVENOUS | Status: DC | PRN

## 2016-08-15 MED ORDER — SODIUM CHLORIDE 0.9 % IV SOLN
INTRAVENOUS | Status: DC | PRN
  Administered 2016-08-15 (×2): 5 mL via EPIDURAL

## 2016-08-15 MED ORDER — ACETAMINOPHEN 325 MG PO TABS
650.0000 mg | ORAL_TABLET | ORAL | Status: DC | PRN
  Filled 2016-08-15: qty 2

## 2016-08-15 MED ORDER — NALBUPHINE HCL 10 MG/ML IJ SOLN: 5.0000 mg | INTRAMUSCULAR | Status: DC | PRN

## 2016-08-15 MED ORDER — ZOLPIDEM TARTRATE 5 MG PO TABS: 5.0000 mg | ORAL_TABLET | Freq: Every evening | ORAL | Status: DC | PRN

## 2016-08-15 MED ORDER — SODIUM CHLORIDE 0.9 % IV SOLN
1.0000 g | INTRAVENOUS | Status: DC
  Filled 2016-08-15 (×3): qty 1000

## 2016-08-15 MED ORDER — LIDOCAINE HCL (PF) 1 % IJ SOLN
INTRAMUSCULAR | Status: DC | PRN
  Administered 2016-08-15: 1 mL via INTRADERMAL

## 2016-08-15 MED ORDER — NALOXONE HCL 0.4 MG/ML IJ SOLN: 0.4000 mg | INTRAMUSCULAR | Status: DC | PRN

## 2016-08-15 MED ORDER — DIPHENHYDRAMINE HCL 50 MG/ML IJ SOLN
12.5000 mg | INTRAMUSCULAR | Status: DC | PRN
Start: 2016-08-15 — End: 2016-08-16

## 2016-08-15 MED ORDER — SODIUM CHLORIDE 0.9 % IV SOLN
2.0000 g | Freq: Once | INTRAVENOUS | Status: AC
  Administered 2016-08-15: 2 g via INTRAVENOUS

## 2016-08-15 MED ORDER — FENTANYL CITRATE (PF) 100 MCG/2ML IJ SOLN
50.0000 ug | INTRAMUSCULAR | Status: DC | PRN
  Administered 2016-08-15: 100 ug via INTRAVENOUS
  Filled 2016-08-15: qty 2

## 2016-08-15 MED ORDER — SODIUM CHLORIDE 0.9% FLUSH: 3.0000 mL | INTRAVENOUS | Status: DC | PRN

## 2016-08-15 MED ORDER — ALBUTEROL SULFATE (2.5 MG/3ML) 0.083% IN NEBU: 3.0000 mL | INHALATION_SOLUTION | Freq: Four times a day (QID) | RESPIRATORY_TRACT | Status: DC | PRN

## 2016-08-15 MED ORDER — CALCIUM CARBONATE ANTACID 500 MG PO CHEW: 2.0000 | CHEWABLE_TABLET | ORAL | Status: DC | PRN

## 2016-08-15 MED ORDER — ONDANSETRON HCL 4 MG/2ML IJ SOLN
INTRAMUSCULAR | Status: AC
  Administered 2016-08-15: 4 mg via INTRAVENOUS
  Filled 2016-08-15: qty 2

## 2016-08-15 MED ORDER — SODIUM CHLORIDE 0.9 % IV SOLN
INTRAVENOUS | Status: AC
  Administered 2016-08-15: 2 g via INTRAVENOUS
  Filled 2016-08-15: qty 2000

## 2016-08-15 MED ORDER — ONDANSETRON HCL 4 MG/2ML IJ SOLN
4.0000 mg | Freq: Once | INTRAMUSCULAR | Status: AC
  Administered 2016-08-15: 4 mg via INTRAVENOUS

## 2016-08-15 MED ORDER — FENTANYL 2.5 MCG/ML W/ROPIVACAINE 0.2% IN NS 100 ML EPIDURAL INFUSION (ARMC-ANES)
10.0000 mL/h | EPIDURAL | Status: DC
  Administered 2016-08-15: 10 mL/h via EPIDURAL

## 2016-08-15 MED ORDER — DOCUSATE SODIUM 100 MG PO CAPS
100.0000 mg | ORAL_CAPSULE | Freq: Every day | ORAL | Status: DC
  Filled 2016-08-15: qty 1

## 2016-08-15 NOTE — Progress Notes (Signed)
Patient ID: Casey Owens, female   DOB: Oct 04, 1990, 26 y.o.   MRN: [id number]  Pt very uncomfortable. She is a daily THC smoker but was unable or willing to quantify the amount for me. However, she is contracting. We will get her comfortable with epidural if desired or shower if she prefers, and recheck her.  No ROM+ swabs available in the hospital from lab or supply. Wet mount was negative. Nitrazine equivocal, no pooling currently but pad was saturated.  Labs are pending.  Vitals:   08/15/16 1648 08/15/16 1950  BP: (!) 121/59 136/62  Pulse: 74 60  Resp: 20   Temp: 98 F (36.7 C) 98.2 F (36.8 C)   Will continue to monitor baby and mother closely.

## 2016-08-15 NOTE — Progress Notes (Signed)
Patient ID: Casey Owens, female   DOB: [birth year], 26 y.o.   MRN: [id number]  25yo [id number] at 33+6wks with cervical dilation and preterm labor. 2 prior PTD, declined 17-P this pregnancy  She has received 5 doses of amp for GBS ppx/unknown. She received a second dose of steroids this morning. She is on 20mg  q6hrs of nifedipine with good results. Her contractions are spaced. Cat I strip.   Last cervical exam at 0100- 4.5/80.  No new pressure or pain.  A/P: Preterm labor - Continue tocolytics with Ca++ channel blocker - GBS swab collected - Continue fetal monitoring - OOB in room, limited activity - s/p NICU consult - Clear diet today

## 2016-08-15 NOTE — Clinical Social Work Note (Signed)
Clinical Social Work Assessment  Patient Details  Name: Casey Owens MRN: [id number] Date of Birth: 1990/12/11  Date of referral:  08/15/16               Reason for consult:  Substance Use/ETOH Abuse                Permission sought to share information with:    Permission granted to share information::     Name::        Agency::     Relationship::     Contact Information:     Housing/Transportation Living arrangements for the past 2 months:  Single Family Home Source of Information:  Patient Patient Interpreter Needed:  None Criminal Activity/Legal Involvement Pertinent to Current Situation/Hospitalization:  No - Comment as needed Significant Relationships:  Significant Other (Fiance Lytle Michaels ) Lives with:  Minor Children, Significant Other Do you feel safe going back to the place where you live?  Yes Need for family participation in patient care:  Yes (Comment)  Care giving concerns:  Patient lives in [place] with her minor children who are 4 and 1.    Social Worker assessment / plan:  Holiday representative (CSW) received consult for current substance abuse. Patient is positive for marijuana. Patient is in labor and delivery and has not gave birth yet. CSW spoke with RN this morning who reported that it is undetermined if patient will be sent home or will stay at [facility] for delivery. CSW and social work Theatre manager met with patient and her [place] in labor and delivery. Patient was was sitting up in the rocking chair and was able to answer questions appropriately. CSW asked if Lytle Michaels would step out of the room and patient insisted that he stay in the room during assessment. Patient reported that this is her 4th child. Per patient she has an 49 y.o that lives with a paternal grandmother in [place]. Patient reported that she has a 28 y.o and a 28 y.o that live with her and Lytle Michaels in [place]. Patient reported that she has all the supplies she needs for the infant including a car  seat. Patient reported that she uses marijuana as an appetite stimulate. Patient denied other drug and alcohol use. Patient reported that she started using marijuana when she was 26 y.o and does not use it on a regular basis. Patient reported that she receives mental health and substance abuse treatment through [facility] and has a peer support specialist. Brielle provided patient with a list of community resources in [place]. Patient reported that she has been involved with child protective services (CPS) in the past. Patient reported that during her last pregnancy she was also positive for marijuana. CSW made patient aware that if infant is positive for marijuana or any other drug then a (CPS) report will have to be made. Patient verbalized her understanding.    Employment status:  Temporary, Unemployed Insurance information:  Medicaid In [place] PT Recommendations:  Not assessed at this time Information / Referral to community resources:  Outpatient Substance Abuse Treatment Options  Patient/Family's Response to care:  Patient understands that a CPS will have to be made if infant tests positive for substances.   Patient/Family's Understanding of and Emotional Response to Diagnosis, Current Treatment, and Prognosis:  Patient was pleasant and open to answering questions. Patient's fiance Lytle Michaels was very guarded and did not make eye contact or speak during assessment.   Emotional Assessment Appearance:  Appears stated age  Attitude/Demeanor/Rapport:    Affect (typically observed):  Accepting, Adaptable, Pleasant Orientation:  Oriented to Self, Oriented to Place, Oriented to  Time, Oriented to Situation Alcohol / Substance use:  Illicit Drugs Psych involvement (Current and /or in the community):  No (Comment)  Discharge Needs  Concerns to be addressed:  Discharge Planning Concerns Readmission within the last 30 days:  No Current discharge risk:  Substance Abuse Barriers to Discharge:   Continued Medical Work up   [employer], Veronia Beets, LCSW 08/15/2016, 12:11 PM

## 2016-08-15 NOTE — Progress Notes (Signed)
Patient ID: Romie MinusDmtrieve S Basulto, female   DOB: [birth year], 26 y.o.   MRN: [id number]  Called to see patient for significant abdominal pain that started 20 min after eating chicken regular diet this afternoon. Persistent nausea unrelieved with zofran.  She does not appear to be laboring. Her cervix is unchanged, but possible PPROM.  On exam, her abdomen is soft. She describes mid epigastric pain and vomiting. The pain does not radiate to her back. Her belly is TTTouch diffusely.  On toco, no evidence of fetal compromise. She is having irritability with very occasional contractions. She is having no vaginal bleeding. She is writhing.  A/P: Acute onset mid epigastric pain after eating today DDx includes a wide range of infection, peptic ulcer disease, liver or pancreatitis dysfunction. It may be THC withdrawal. It includes liver hemotoma with severe PreE or HELLP, placental abruption, uterine rupture, labor. Her vitals are stable, without severe range pressures. Her initial labs were reassuring, except for a mildly elevated WBC.  - CMP, CBC, lipase, amylase - Will give iv pain meds for now - Rule out PPROM - Low threshold to MRI if no resolution

## 2016-08-15 NOTE — Anesthesia Preprocedure Evaluation (Signed)
Anesthesia Evaluation  Patient identified by MRN, date of birth, ID band Patient awake    Reviewed: Allergy & Precautions, H&P , NPO status , Patient's Chart, lab work & pertinent test results  History of Anesthesia Complications Negative for: history of anesthetic complications  Airway Mallampati: III  TM Distance: >3 FB Neck ROM: full    Dental  (+) Poor Dentition, Chipped   Pulmonary asthma , Current Smoker,    Pulmonary exam normal breath sounds clear to auscultation       Cardiovascular Exercise Tolerance: Good (-) hypertensionnegative cardio ROS Normal cardiovascular exam Rhythm:regular Rate:Normal     Neuro/Psych PSYCHIATRIC DISORDERS Depression    GI/Hepatic negative GI ROS,   Endo/Other    Renal/GU   negative genitourinary   Musculoskeletal   Abdominal   Peds  Hematology negative hematology ROS (+)   Anesthesia Other Findings Past Medical History: No date: Asthma No date: Depression 02/09/2015: Substance abuse affecting pregnancy, antepartum     Comment: Positive for THC & Cocaine  Past Surgical History: No date: NO PAST SURGERIES  BMI    Body Mass Index:  22.14 kg/m      Reproductive/Obstetrics (+) Pregnancy                             Anesthesia Physical Anesthesia Plan  ASA: III  Anesthesia Plan: Epidural   Post-op Pain Management:    Induction:   Airway Management Planned:   Additional Equipment:   Intra-op Plan:   Post-operative Plan:   Informed Consent: I have reviewed the patients History and Physical, chart, labs and discussed the procedure including the risks, benefits and alternatives for the proposed anesthesia with the patient or authorized representative who has indicated his/her understanding and acceptance.     Plan Discussed with: Anesthesiologist  Anesthesia Plan Comments: (Elevated WBC count (20.9).  No other signs or symptoms of  infection such as fever.  Patient is not rupture yet.  Plan to place epidural once antibiotics are started.)        Anesthesia Quick Evaluation

## 2016-08-15 NOTE — Progress Notes (Signed)
Patient ID: Casey Owens, female   DOB: 03/11/1991, 26 y.o.   MRN: [id number]  Regular diet with chicken, vomiting followed. No new contractions. Continue IVF. Repeat NST and monitor for contractions again tonight.

## 2016-08-15 NOTE — Progress Notes (Signed)
Patient ID: Casey Owens, female   DOB: November 26, 1990, 26 y.o.   MRN: [id number]  100mcg iv fentanyl improved pain significantly. Cervical change noted: now 5/80/0/anterior and moderate. This is significant change from previously. Her labs have returned with elevated WBC to 20, suggesting stress response, as otherwise afebrile and other labs normal.  Will continue with labor, atypical pain, as current working dx. Will check for PPROM with ROM+, get epidural, restart GBS ppx.

## 2016-08-15 NOTE — Progress Notes (Signed)
Patient ID: Casey Owens, female   DOB: 1991/04/05, 26 y.o.   MRN: [id number]  25yo [id number] at 33+6wks with cervical dilation and preterm labor. 2 prior PTD, declined 17-P this pregnancy  She has received 5 doses of amp for GBS ppx/unknown. She received a second dose of steroids this morning. She is on 20mg  q6hrs of nifedipine with good results. Her contractions are spaced. Cat I strip.   Last cervical exam at 1400 by me because of increase in pelvic pressure by patient: 4.5/50/ballotable  FHT- Mod variability, +accels, +decel without contraction when patient lying supine x3 over the last 2 days.   A/P: Preterm labor, unchanged cervix x24hrs without contractions  - Will transfer to antepartum  - Continue modified bed rest. More comfortable with patient. - Continue tocolytics with Ca++ channel blocker - GBS swab pending - NST with toco q shift - OOB in room, limited activity - s/p NICU consult - Advance diet today

## 2016-08-15 NOTE — Anesthesia Procedure Notes (Signed)
Epidural Patient location during procedure: OB Start time: 08/15/2016 10:23 PM End time: 08/15/2016 10:27 PM  Staffing Anesthesiologist: Margorie John K Performed: anesthesiologist   Preanesthetic Checklist Completed: patient identified, site marked, surgical consent, pre-op evaluation, timeout performed, IV checked, risks and benefits discussed and monitors and equipment checked  Epidural Patient position: sitting Prep: Betadine Patient monitoring: heart rate, continuous pulse ox and blood pressure Approach: midline Location: L3-L4 Injection technique: LOR saline  Needle:  Needle type: Tuohy  Needle gauge: 17 G Needle length: 9 cm and 9 Needle insertion depth: 5 cm Catheter type: closed end flexible Catheter size: 19 Gauge Catheter at skin depth: 9 cm Test dose: negative and 1.5% lidocaine with Epi 1:200 K  Assessment Sensory level: T10 Events: blood not aspirated, injection not painful, no injection resistance, negative IV test and no paresthesia  Additional Notes Pt. Evaluated and documentation done after procedure finished. Patient identified. Risks/Benefits/Options discussed with patient including but not limited to bleeding, infection, nerve damage, paralysis, failed block, incomplete pain control, headache, blood pressure changes, nausea, vomiting, reactions to medication both or allergic, itching and postpartum back pain. Confirmed with bedside nurse the patient's most recent platelet count. Confirmed with patient that they are not currently taking any anticoagulation, have any bleeding history or any family history of bleeding disorders. Patient expressed understanding and wished to proceed. All questions were answered. Sterile technique was used throughout the entire procedure. Please see nursing notes for vital signs. Test dose was given through epidural catheter and negative prior to continuing to dose epidural or start infusion. Warning signs of high block given to  the patient including shortness of breath, tingling/numbness in hands, complete motor block, or any concerning symptoms with instructions to call for help. Patient was given instructions on fall risk and not to get out of bed. All questions and concerns addressed with instructions to call with any issues or inadequate analgesia.   Patient tolerated the insertion well without immediate complications.Reason for block:procedure for pain

## 2016-08-16 DIAGNOSIS — O9943 Diseases of the circulatory system complicating the puerperium: Secondary | ICD-10-CM | POA: Diagnosis not present

## 2016-08-16 DIAGNOSIS — I498 Other specified cardiac arrhythmias: Secondary | ICD-10-CM | POA: Diagnosis not present

## 2016-08-16 DIAGNOSIS — F1490 Cocaine use, unspecified, uncomplicated: Secondary | ICD-10-CM | POA: Diagnosis present

## 2016-08-16 DIAGNOSIS — D573 Sickle-cell trait: Secondary | ICD-10-CM | POA: Diagnosis present

## 2016-08-16 DIAGNOSIS — D72829 Elevated white blood cell count, unspecified: Secondary | ICD-10-CM | POA: Diagnosis not present

## 2016-08-16 DIAGNOSIS — O9902 Anemia complicating childbirth: Secondary | ICD-10-CM | POA: Diagnosis present

## 2016-08-16 DIAGNOSIS — Z8659 Personal history of other mental and behavioral disorders: Secondary | ICD-10-CM | POA: Diagnosis not present

## 2016-08-16 DIAGNOSIS — Z915 Personal history of self-harm: Secondary | ICD-10-CM | POA: Diagnosis not present

## 2016-08-16 DIAGNOSIS — O99324 Drug use complicating childbirth: Secondary | ICD-10-CM | POA: Diagnosis present

## 2016-08-16 DIAGNOSIS — T380X5A Adverse effect of glucocorticoids and synthetic analogues, initial encounter: Secondary | ICD-10-CM | POA: Diagnosis not present

## 2016-08-16 DIAGNOSIS — I491 Atrial premature depolarization: Secondary | ICD-10-CM | POA: Diagnosis not present

## 2016-08-16 DIAGNOSIS — J45909 Unspecified asthma, uncomplicated: Secondary | ICD-10-CM | POA: Diagnosis present

## 2016-08-16 DIAGNOSIS — O9952 Diseases of the respiratory system complicating childbirth: Secondary | ICD-10-CM | POA: Diagnosis present

## 2016-08-16 DIAGNOSIS — R011 Cardiac murmur, unspecified: Secondary | ICD-10-CM | POA: Diagnosis not present

## 2016-08-16 DIAGNOSIS — O9912 Other diseases of the blood and blood-forming organs and certain disorders involving the immune mechanism complicating childbirth: Secondary | ICD-10-CM | POA: Diagnosis present

## 2016-08-16 DIAGNOSIS — F17210 Nicotine dependence, cigarettes, uncomplicated: Secondary | ICD-10-CM | POA: Diagnosis present

## 2016-08-16 DIAGNOSIS — I493 Ventricular premature depolarization: Secondary | ICD-10-CM | POA: Diagnosis not present

## 2016-08-16 DIAGNOSIS — O99344 Other mental disorders complicating childbirth: Secondary | ICD-10-CM | POA: Diagnosis present

## 2016-08-16 DIAGNOSIS — F1290 Cannabis use, unspecified, uncomplicated: Secondary | ICD-10-CM | POA: Diagnosis present

## 2016-08-16 DIAGNOSIS — Y92230 Patient room in hospital as the place of occurrence of the external cause: Secondary | ICD-10-CM | POA: Diagnosis not present

## 2016-08-16 DIAGNOSIS — O99334 Smoking (tobacco) complicating childbirth: Secondary | ICD-10-CM | POA: Diagnosis present

## 2016-08-16 DIAGNOSIS — Z3A33 33 weeks gestation of pregnancy: Secondary | ICD-10-CM | POA: Diagnosis not present

## 2016-08-16 LAB — CBC
HEMATOCRIT: 33.9 % — AB (ref 35.0–47.0)
HEMATOCRIT: 30.6 % — AB (ref 35.0–47.0)
HEMOGLOBIN: 12 g/dL (ref 12.0–16.0)
HEMOGLOBIN: 10.5 g/dL — AB (ref 12.0–16.0)
MCH: 33.4 pg (ref 26.0–34.0)
MCH: 32 pg (ref 26.0–34.0)
MCHC: 35.4 g/dL (ref 32.0–36.0)
MCHC: 34.1 g/dL (ref 32.0–36.0)
MCV: 94.4 fL (ref 80.0–100.0)
MCV: 93.6 fL (ref 80.0–100.0)
Platelets: 243 10*3/uL (ref 150–440)
Platelets: 246 10*3/uL (ref 150–440)
RBC: 3.6 MIL/uL — AB (ref 3.80–5.20)
RBC: 3.27 MIL/uL — AB (ref 3.80–5.20)
RDW: 13.3 % (ref 11.5–14.5)
RDW: 13.4 % (ref 11.5–14.5)
WBC: 24.4 10*3/uL — AB (ref 3.6–11.0)
WBC: 20.4 10*3/uL — ABNORMAL HIGH (ref 3.6–11.0)

## 2016-08-16 LAB — ROM PLUS (ARMC ONLY): Rom Plus: NEGATIVE

## 2016-08-16 MED ORDER — LIDOCAINE HCL (PF) 1 % IJ SOLN
INTRAMUSCULAR | Status: AC
  Filled 2016-08-16: qty 30

## 2016-08-16 MED ORDER — PROCHLORPERAZINE EDISYLATE 5 MG/ML IJ SOLN
10.0000 mg | Freq: Once | INTRAMUSCULAR | Status: AC
  Administered 2016-08-16: 10 mg via INTRAVENOUS
  Filled 2016-08-16: qty 2

## 2016-08-16 MED ORDER — OXYTOCIN 40 UNITS IN LACTATED RINGERS INFUSION - SIMPLE MED
INTRAVENOUS | Status: AC
  Administered 2016-08-16: 500 mL via INTRAVENOUS
  Filled 2016-08-16: qty 1000

## 2016-08-16 MED ORDER — ONDANSETRON HCL 4 MG PO TABS: 4.0000 mg | ORAL_TABLET | ORAL | Status: DC | PRN

## 2016-08-16 MED ORDER — FLEET ENEMA 7-19 GM/118ML RE ENEM: 1.0000 | ENEMA | Freq: Every day | RECTAL | Status: DC | PRN

## 2016-08-16 MED ORDER — IBUPROFEN 600 MG PO TABS
ORAL_TABLET | ORAL | Status: AC
  Administered 2016-08-16: 600 mg via ORAL
  Filled 2016-08-16: qty 1

## 2016-08-16 MED ORDER — SIMETHICONE 80 MG PO CHEW
80.0000 mg | CHEWABLE_TABLET | ORAL | Status: DC | PRN
  Filled 2016-08-16: qty 1

## 2016-08-16 MED ORDER — OXYTOCIN 10 UNIT/ML IJ SOLN
INTRAMUSCULAR | Status: AC
  Filled 2016-08-16: qty 2

## 2016-08-16 MED ORDER — MISOPROSTOL 200 MCG PO TABS
ORAL_TABLET | ORAL | Status: AC
  Filled 2016-08-16: qty 4

## 2016-08-16 MED ORDER — DIBUCAINE 1 % RE OINT: 1.0000 "application " | TOPICAL_OINTMENT | RECTAL | Status: DC | PRN

## 2016-08-16 MED ORDER — DIPHENHYDRAMINE HCL 25 MG PO CAPS: 25.0000 mg | ORAL_CAPSULE | Freq: Four times a day (QID) | ORAL | Status: DC | PRN

## 2016-08-16 MED ORDER — BISACODYL 10 MG RE SUPP: 10.0000 mg | Freq: Every day | RECTAL | Status: DC | PRN

## 2016-08-16 MED ORDER — TETANUS-DIPHTH-ACELL PERTUSSIS 5-2.5-18.5 LF-MCG/0.5 IM SUSP
0.5000 mL | Freq: Once | INTRAMUSCULAR | Status: DC
  Filled 2016-08-16: qty 0.5

## 2016-08-16 MED ORDER — OXYTOCIN 40 UNITS IN LACTATED RINGERS INFUSION - SIMPLE MED: 2.5000 [IU]/h | INTRAVENOUS | Status: DC

## 2016-08-16 MED ORDER — AMMONIA AROMATIC IN INHA
RESPIRATORY_TRACT | Status: AC
  Filled 2016-08-16: qty 10

## 2016-08-16 MED ORDER — SODIUM CHLORIDE 0.9% FLUSH: 3.0000 mL | INTRAVENOUS | Status: DC | PRN

## 2016-08-16 MED ORDER — SODIUM CHLORIDE 0.9% FLUSH
3.0000 mL | Freq: Two times a day (BID) | INTRAVENOUS | Status: DC
  Administered 2016-08-16 – 2016-08-17 (×2): 3 mL via INTRAVENOUS

## 2016-08-16 MED ORDER — ZOLPIDEM TARTRATE 5 MG PO TABS
5.0000 mg | ORAL_TABLET | Freq: Every evening | ORAL | Status: DC | PRN
  Administered 2016-08-16: 5 mg via ORAL
  Filled 2016-08-16: qty 1

## 2016-08-16 MED ORDER — FLUOXETINE HCL 20 MG PO CAPS
40.0000 mg | ORAL_CAPSULE | Freq: Every day | ORAL | Status: DC
  Administered 2016-08-16 – 2016-08-17 (×2): 40 mg via ORAL
  Filled 2016-08-16 (×2): qty 2

## 2016-08-16 MED ORDER — SENNOSIDES-DOCUSATE SODIUM 8.6-50 MG PO TABS: 2.0000 | ORAL_TABLET | ORAL | Status: DC

## 2016-08-16 MED ORDER — ACETAMINOPHEN 325 MG PO TABS: 650.0000 mg | ORAL_TABLET | ORAL | Status: DC | PRN

## 2016-08-16 MED ORDER — IBUPROFEN 400 MG PO TABS
600.0000 mg | ORAL_TABLET | Freq: Four times a day (QID) | ORAL | Status: DC
  Administered 2016-08-16 – 2016-08-17 (×4): 600 mg via ORAL
  Filled 2016-08-16 (×4): qty 2

## 2016-08-16 MED ORDER — OXYTOCIN BOLUS FROM INFUSION
500.0000 mL | Freq: Once | INTRAVENOUS | Status: AC
  Administered 2016-08-16: 500 mL via INTRAVENOUS

## 2016-08-16 MED ORDER — MEASLES, MUMPS & RUBELLA VAC ~~LOC~~ INJ: 0.5000 mL | INJECTION | Freq: Once | SUBCUTANEOUS | Status: DC

## 2016-08-16 MED ORDER — WITCH HAZEL-GLYCERIN EX PADS: 1.0000 "application " | MEDICATED_PAD | CUTANEOUS | Status: DC | PRN

## 2016-08-16 MED ORDER — IBUPROFEN 600 MG PO TABS
600.0000 mg | ORAL_TABLET | Freq: Four times a day (QID) | ORAL | Status: DC
  Administered 2016-08-16: 600 mg via ORAL

## 2016-08-16 MED ORDER — COCONUT OIL OIL: 1.0000 "application " | TOPICAL_OIL | Status: DC | PRN

## 2016-08-16 MED ORDER — BENZOCAINE-MENTHOL 20-0.5 % EX AERO: 1.0000 "application " | INHALATION_SPRAY | CUTANEOUS | Status: DC | PRN

## 2016-08-16 MED ORDER — ONDANSETRON HCL 4 MG/2ML IJ SOLN
4.0000 mg | INTRAMUSCULAR | Status: DC | PRN
  Administered 2016-08-16: 4 mg via INTRAVENOUS
  Filled 2016-08-16: qty 2

## 2016-08-16 MED ORDER — PRENATAL MULTIVITAMIN CH
1.0000 | ORAL_TABLET | Freq: Every day | ORAL | Status: DC
  Administered 2016-08-16 – 2016-08-17 (×2): 1 via ORAL
  Filled 2016-08-16 (×2): qty 1

## 2016-08-16 MED ORDER — SODIUM CHLORIDE 0.9 % IV SOLN: 250.0000 mL | INTRAVENOUS | Status: DC | PRN

## 2016-08-16 NOTE — Consult Note (Signed)
[facility]  CARDIOLOGY CONSULT NOTE  Patient ID: Casey Owens MRN: [id number] DOB/AGE: April 26, 1991 26 y.o.  Admit date: 08/14/2016 Referring Physician Dr. Dalbert GarnetBeasley Primary Physician   Primary Cardiologist   Reason for Consultation bradycardia  HPI: Patient is a 26 year old female who recently delivered a child vaginally. She has noted to have bradycardia post delivery. Patient had some tachycardia issues with her previous child. On this presentation, after delivering, she developed atrial bigeminy with blocked PACs. She is hemodynamically stable currently but felt dizzy when this began to occur. Pauses. Currently is asymptomatic. She has a ventricular rate of high 30s low 40s. She is hemodynamically stable. Telemetry reveals blocked PACs. She occasionally will have 1-1 conduction.  Review of Systems  Constitutional: Negative.   HENT: Negative.   Eyes: Negative.   Respiratory: Negative.   Cardiovascular: Negative.   Genitourinary: Negative.   Musculoskeletal: Negative.   Skin: Negative.   Neurological: Negative.   Endo/Heme/Allergies: Negative.   Psychiatric/Behavioral: Negative.     Past Medical History:  Diagnosis Date  . Asthma   . Depression   . Substance abuse affecting pregnancy, antepartum 02/09/2015   Positive for THC & Cocaine    History reviewed. No pertinent family history.  Social History   Social History  . Marital status: Single    Spouse name: N/A  . Number of children: N/A  . Years of education: N/A   Occupational History  . Not on file.   Social History Main Topics  . Smoking status: Current Every Day Smoker    Packs/day: 0.25    Years: 10.00    Types: Cigarettes  . Smokeless tobacco: Never Used  . Alcohol use No  . Drug use: Yes    Types: Cocaine, Marijuana     Comment: says quit drug use in January  . Sexual activity: Yes    Birth control/ protection: None, Inserts   Other Topics Concern  . Not  on file   Social History Narrative  . No narrative on file    Past Surgical History:  Procedure Laterality Date  . NO PAST SURGERIES       Prescriptions Prior to Admission  Medication Sig Dispense Refill Last Dose  . albuterol (ACCUNEB) 0.63 MG/3ML nebulizer solution Take 1 ampule by nebulization every 6 (six) hours as needed for wheezing.   Past Month at Unknown time  . Prenatal Vit-Fe Fumarate-FA (MULTIVITAMIN-PRENATAL) 27-0.8 MG TABS tablet Take 1 tablet by mouth daily at 12 noon.   Past Month at Unknown time  . FLUoxetine (PROZAC) 40 MG capsule Take 40 mg by mouth daily.   Not Taking at Unknown time    Physical Exam: Blood pressure 121/68, pulse (!) 56, temperature 98.1 F (36.7 C), temperature source Oral, resp. rate 16, height 5\' 4"  (1.626 m), weight 58.5 kg (129 lb), SpO2 100 %, unknown if currently breastfeeding.   Wt Readings from Last 1 Encounters:  08/14/16 58.5 kg (129 lb)     General appearance: alert and cooperative Resp: clear to auscultation bilaterally Cardio: Bradycardia Extremities: extremities normal, atraumatic, no cyanosis or edema Neurologic: Grossly normal  Labs:   Lab Results  Component Value Date   WBC 24.4 (H) 08/16/2016   HGB 12.0 08/16/2016   HCT 33.9 (L) 08/16/2016   MCV 94.4 08/16/2016   PLT 243 08/16/2016    Recent Labs Lab 08/15/16 2108  NA 135  K 3.7  CL 106  CO2 19*  BUN 5*  CREATININE 0.55  CALCIUM 8.9  PROT 6.5  BILITOT 0.8  ALKPHOS 141*  ALT 17  AST 25  GLUCOSE 110*   Lab Results  Component Value Date   TROPONINI 0.03 03/24/2015       EKG: Sinus rhythm with blocked PACs  ASSESSMENT AND PLAN: 26 year old female with bradycardia post delivery. EKG reveals sinus rhythm with atrial bigeminy with blocked PACs. She occasionally will conduct. She is hemodynamically stable. We'll proceed with an echocardiogram to evaluate LV function. Part of. We'll continue to follow lower conduction appears to be improving. She will not  need a pacemaker. Most likely she will be able to ambulate and discharge later today after the echo is completed Signed: Dalia Heading MD, [facility] 08/16/2016, 12:31 PM

## 2016-08-16 NOTE — Progress Notes (Addendum)
Post Partum Day 0 s/p NSVD 4 1/2 hrs ago, uncomplicated. Minimal blood loss, no fever, membranes ruptured for 1 min prior to delivery.  Pt with sudden onset of bradycardia to a low of 32 bpm, elevation of BP to 169/90. O2 sats remain 100%. Minimal blood loss at delivery. Received nifedipine 20mg  q6hrs for 2 days prior to delivery as a tocolytic.  Subjective: Feels "weird". No dizziness while lying, +when standing. "Face on fire", bilateral leg shivering. Initially felt cold with generalized shivering, now resolved except for legs. Feels tired. No syncope or presyncope. Falls asleep easily, but O2 sats remain at 100% and HR stays in 40s when she does.  Objective: Blood pressure (!) 169/80, pulse (!) 45, temperature 98.6 F (37 C), temperature source Oral, resp. rate (!) 22, height 5\' 4"  (1.626 m), weight 129 lb (58.5 kg), SpO2 100 %, unknown if currently breastfeeding.  Physical Exam:  General: cooperative, distracted and fatigued Lochia: appropriate Uterine Fundus: firm DVT Evaluation: No evidence of DVT seen on physical exam. Negative Homan's sign. No cords or calf tenderness. No significant calf/ankle edema.  CV: Bradycardia, regular rhythm with occasional PVC, loud systolic ejection murmur loudest in the left 2nd intercostal space. Normal pulses bilateral extremities. Pulm: CTAB x6 lung fields  Tele strip on the floor with PVC, sinus brady  Recent Labs  08/14/16 0947 08/15/16 2108  HGB 12.3 12.1  HCT 36.3 34.7*    Assessment/Plan:  - Review of records and patient recollection reveal a cardiology visit at the beginning of this pregnancy for similar episode after last delivery. Visit in careeverywhere on 05/02/16. Conclusion was increased vagal tone without concern.  TTEchocardiogram 03/24/15  [facility] (Available on careeverywere tab) Study Conclusions - Left ventricle: The cavity size was normal. Systolic function  was normal. The estimated ejection fraction was in the range of 50% to 55%. Regional wall motion abnormalities cannot be excluded. Doppler parameters are consistent with abnormal left ventricular relaxation (grade 1 diastolic dysfunction). - Aortic valve: Valve area (Vmax): 2.52 cm^2. - Mitral valve: There was moderate regurgitation. - Left atrium: The atrium was mildly dilated. - Atrial septum: No defect or patent foramen ovale was identified.  PLAN:  - Formal EKG now - Transfer to tele unit - cardiology consult as inpatient - currently appears stable  [facility] will [facility]. He thinks nothing acutely concerning right now.    LOS: 0 days   Ferdie Bakken 08/16/2016, 5:29 AM

## 2016-08-16 NOTE — Care Management (Signed)
Patient was transferred to telemetry due to bradycardia after vaginal delivery 08/16/3016.  CSW had assessed due to positive drug screen while on mother baby unit

## 2016-08-16 NOTE — Progress Notes (Signed)
Fundus check by Mother-Baby RN. No complaints or concerns at this time.

## 2016-08-16 NOTE — Progress Notes (Signed)
S: s/p cards consult, doing well. Has been up to see baby today. HR still low. Mother baby nurses coming down for evaluation over the day.  Echo ordered today by cards to assess for LV function.  O:  Vitals:   08/16/16 0756 08/16/16 1227  BP: 115/67 121/68  Pulse: (!) 42 (!) 56  Resp:  16  Temp:  98.1 F (36.7 C)   A/P: Postpartum stable.  Appreciate cards recs.

## 2016-08-16 NOTE — Progress Notes (Addendum)
Post Partum Day 0 - pt in the tele unit with continuous monitoring, showing NSR with rate in the 40s. Subjective: Doing well, no complaints.  Sleeping comfortably. Fundus firm. Still feeling fatigued. Mood happy, fully present and cheerful.  No CP SOB F/C  or leg pain no HA change of vision, RUQ/epigastric pain  Objective: BP 115/67 (BP Location: Left Arm)   Pulse (!) 42   Temp 98.3 F (36.8 C) (Oral)   Resp 16   Ht 5\' 4"  (1.626 m)   Wt 129 lb (58.5 kg)   LMP  (LMP Unknown)   SpO2 99%   Breastfeeding? Unknown   BMI 22.14 kg/m    Physical Exam:  General: NAD CV: RRR Pulm: nl effort, CTABL Lochia: moderate Uterine Fundus: fundus firm and below umbilicus, non-tender DVT Evaluation: no cords, ttp LEs, no edema   Recent Labs  08/15/16 2108 08/16/16 0508  HGB 12.1 12.0  HCT 34.7* 33.9*  WBC 20.9* 24.4*  PLT 282 243    Assessment/Plan: 25 y.o. [id number] postpartum day # 0  1. Bradycardia:  - cards monitoring - cards consult today  2. Postpartum: - stable bleeding - fundus firm - Not planning on breastfeeding. - may visit infant PRN  3. Leukocytosis: - likely due to BMZ given for fetal lung maturity 24hrs ago - no source of infection. Uterus benign. O2 sats high.   ----- Casey DouglasBethany Favor Kreh, MD MPH Attending Obstetrician and Gynecologist [facility]

## 2016-08-17 ENCOUNTER — Inpatient Hospital Stay
Admit: 2016-08-17 | Discharge: 2016-08-17 | Disposition: A | Discharge status: Home / Self Care | Payer: Medicaid Other | Attending: Cardiology | Admitting: Cardiology

## 2016-08-17 LAB — CULTURE, BETA STREP (GROUP B ONLY)

## 2016-08-17 LAB — ECHOCARDIOGRAM COMPLETE
Height: 64 in
Weight: 2064 oz

## 2016-08-17 MED ORDER — IBUPROFEN 600 MG PO TABS: 600.0000 mg | ORAL_TABLET | Freq: Four times a day (QID) | ORAL | 0 refills | Status: DC

## 2016-08-17 MED ORDER — BENZOCAINE-MENTHOL 20-0.5 % EX AERO: 1.0000 "application " | INHALATION_SPRAY | CUTANEOUS | 1 refills | Status: DC | PRN

## 2016-08-17 NOTE — Progress Notes (Signed)
[facility]  SUBJECTIVE: asymptomatic   Vitals:   08/16/16 1227 08/16/16 1923 08/16/16 2135 08/17/16 0323  BP: 121/68 (!) 151/74 (!) 163/80 124/65  Pulse: (!) 56 (!) 48 (!) 56 (!) 43  Resp: 16 18  18   Temp: 98.1 F (36.7 C) 98.1 F (36.7 C)  98.4 F (36.9 C)  TempSrc: Oral Oral  Oral  SpO2: 100% 100% 99% 96%  Weight:      Height:        Intake/Output Summary (Last 24 hours) at 08/17/16 1028 Last data filed at 08/17/16 0547  Gross per 24 hour  Intake                0 ml  Output             1650 ml  Net            -1650 ml    LABS: Basic Metabolic Panel:  Recent Labs  16/02/9602/22/18 2108  NA 135  K 3.7  CL 106  CO2 19*  GLUCOSE 110*  BUN 5*  CREATININE 0.55  CALCIUM 8.9   Liver Function Tests:  Recent Labs  08/15/16 2108  AST 25  ALT 17  ALKPHOS 141*  BILITOT 0.8  PROT 6.5  ALBUMIN 2.9*    Recent Labs  08/15/16 2108  LIPASE 23  AMYLASE 91   CBC:  Recent Labs  08/16/16 0508 08/16/16 1642  WBC 24.4* 20.4*  HGB 12.0 10.5*  HCT 33.9* 30.6*  MCV 94.4 93.6  PLT 243 246   Cardiac Enzymes: No results for input(s): CKTOTAL, CKMB, CKMBINDEX, TROPONINI in the last 72 hours. BNP: Invalid input(s): POCBNP D-Dimer: No results for input(s): DDIMER in the last 72 hours. Hemoglobin A1C: No results for input(s): HGBA1C in the last 72 hours. Fasting Lipid Panel: No results for input(s): CHOL, HDL, LDLCALC, TRIG, CHOLHDL, LDLDIRECT in the last 72 hours. Thyroid Function Tests: No results for input(s): TSH, T4TOTAL, T3FREE, THYROIDAB in the last 72 hours.  Invalid input(s): FREET3 Anemia Panel: No results for input(s): VITAMINB12, FOLATE, FERRITIN, TIBC, IRON, RETICCTPCT in the last 72 hours.   Physical Exam: Blood pressure 124/65, pulse (!) 43, temperature 98.4 F (36.9 C), temperature source Oral, resp. rate 18, height 5\' 4"  (1.626 m), weight 58.5 kg (129 lb), SpO2 96 %, unknown if currently breastfeeding.   Wt  Readings from Last 1 Encounters:  08/14/16 58.5 kg (129 lb)     General appearance: alert and cooperative Resp: clear to auscultation bilaterally Cardio: regular rate and rhythm GI: soft, non-tender; bowel sounds normal; no masses,  no organomegaly Neurologic: Grossly normal  TELEMETRY: Reviewed telemetry pt in sinus bradycardia with intermittent juctional rhythm. No prolonged pauses:  ASSESSMENT AND PLAN:  Active Problems:   Preterm labor   Preterm labor in third trimester with preterm delivery   Bradycardia-developed sinus bradycardia post vaginal delivery. Had blocked pacs with atrial bigeminy. No prolonged pauses. LV funciton normal on echo. Asymptomatic. OK for discharge from cardiac standpoint with 48 hour holter monitor to be placed at discharge. Will see as outpatient if desired.  Dalia HeadingKenneth A Reighn Kaplan, MD, [facility] 08/17/2016 10:28 AM

## 2016-08-17 NOTE — Progress Notes (Signed)
Patient given discharge teaching and paperwork regarding medications, diet, follow-up appointments and activity. Patient understanding verbalized. No complaints at this time. IV and telemetry discontinued prior to leaving. Skin assessment as previously charted and vitals are stable; on room air. Patient being discharged to home. Caregiver/family present during discharge teaching. Prescriptions sent to pharmacy.   Holter monitor set up by respiratory therapist - patient aware of when and where to return monitor and instructed to call cardiologist office next week for a follow-up appointment.

## 2016-08-17 NOTE — Progress Notes (Signed)
*  PRELIMINARY RESULTS* Echocardiogram 2D Echocardiogram has been performed.  Casey Owens 08/17/2016, 8:39 AM

## 2016-08-17 NOTE — Discharge Summary (Signed)
Obstetrical Discharge Summary  Patient Name: Casey Owens DOB: 1991/02/22 MRN: [id number]  Date of Admission: 08/14/2016 Date of Delivery: 08/16/16 at 0055(manually enter date) Delivered by: Dalbert GarnetBeasley , MD Date of Discharge: 08/17/2016  Primary OB:  OBGYN [facility]  LMP:No LMP recorded (lmp unknown). EDC Estimated Date of Delivery: 09/27/16 Gestational Age at Delivery: 5971w0d   Antepartum complications: preterm ctx  Admitting Diagnosis:  Secondary Diagnosis: Patient Active Problem List   Diagnosis Date Noted  . Preterm labor in third trimester with preterm delivery 08/16/2016  . Labor and delivery, indication for care 06/08/2016  . Pelvic pain affecting pregnancy in second trimester, antepartum 04/17/2016  . Pelvic pain affecting pregnancy 04/16/2016  . Bradycardia 03/24/2015  . Indication for care in labor and delivery, delivered 03/24/2015  . Indication for care or intervention related to labor and delivery 03/23/2015  . Abdominal pain 03/17/2015  . Preterm labor 03/17/2015  . 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 Tobacco use in pregnancy 02/10/2015  . Threatened preterm labor, antepartum 02/09/2015  . Adjustment disorder with mixed anxiety and depressed mood 11/17/2014  . Pregnant 11/17/2014  . Cannabis abuse 11/17/2014    Augmentation: none Complications:Preterm ctx  Intrapartum complications/course: previously received steroids and got a rescue shot on this admission  Date of Delivery: 08/16/16 @ 0055 Delivered By: Dalbert GarnetBeasley, MD  Delivery Type: spontaneous vaginal delivery Anesthesia: epidural Placenta: sponatneous Laceration:  Episiotomy: none Newborn Data: Live born female  Birth Weight: 4 lb 2 oz (1870 g) APGAR: 8, 9    Postpartum Procedures: Pt developed PP bradycardia into 30'S.  Transferred to telemetry . Cardiology eval  Dr Lady GaryFath . Sinus brady . Echo with nl LV function  On D/C asymptomatic.    Post partum course: as  above Patient had an uncomplicated postpartum course.  By time of discharge on PPD#1, her pain was controlled on oral pain medications; she had appropriate lochia and was ambulating, voiding without difficulty and tolerating regular diet.  She was deemed stable for discharge to home.  Will be d/c on Holter monitor for 48 hrs per Dr Lady GaryFAth    Discharge Physical Exam:  BP 124/65 (BP Location: Right Arm)   Pulse (!) 43   Temp 98.4 F (36.9 C) (Oral)   Resp 18   Ht 5\' 4"  (1.626 m)   Wt 129 lb (58.5 kg)   LMP  (LMP Unknown)   SpO2 96%   Breastfeeding? Unknown   BMI 22.14 kg/m   General: NAD CV: bradycardia with murmur  Pulm: CTABL, nl effort ABD: s/nd/nt, fundus firm and below the umbilicus Lochia: moderate Incision: c/d/i DVT Evaluation: LE non-ttp, no evidence of DVT on exam.  Hemoglobin  Date Value Ref Range Status  08/16/2016 10.5 (L) 12.0 - 16.0 g/dL Final   HGB  Date Value Ref Range Status  08/19/2013 14.0 12.0 - 16.0 g/dL Final   HCT  Date Value Ref Range Status  08/16/2016 30.6 (L) 35.0 - 47.0 % Final  08/19/2013 41.2 35.0 - 47.0 % Final   Results for orders placed or performed during the hospital encounter of 08/14/16 (from the past 24 hour(s))  CBC     Status: Abnormal   Collection Time: 08/16/16  4:42 PM  Result Value Ref Range   WBC 20.4 (H) 3.6 - 11.0 K/uL   RBC 3.27 (L) 3.80 - 5.20 MIL/uL   Hemoglobin 10.5 (L) 12.0 - 16.0 g/dL   HCT 40.930.6 (L) 81.135.0 - 91.447.0 %   MCV 93.6  80.0 - 100.0 fL   MCH 32.0 26.0 - 34.0 pg   MCHC 34.1 32.0 - 36.0 g/dL   RDW 16.1 09.6 - 04.5 %   Platelets 246 150 - 440 K/uL    Disposition: stable, discharge to home. Baby Feeding:Bottle  Baby Disposition: in nursery Rh Immune globulin given:  Rubella vaccine given:  Tdap vaccine given in AP or PP setting:  Flu vaccine given in AP or PP setting:   Contraception: desires Nexplanon at 6 week appt - pt will call to notify clinic  Prenatal Labs:     Plan:  Casey Owens was  discharged to home in good condition. [facility]-up appointment at [facility] in 6 weeks   Discharge Medications: Allergies as of 08/17/2016      Reactions   Shellfish Allergy Swelling      Medication List    TAKE these medications   albuterol 0.63 MG/3ML nebulizer solution Commonly known as:  ACCUNEB Take 1 ampule by nebulization every 6 (six) hours as needed for wheezing.   benzocaine-Menthol 20-0.5 % Aero Commonly known as:  DERMOPLAST Apply 1 application topically as needed for irritation (perineal discomfort).   FLUoxetine 40 MG capsule Commonly known as:  PROZAC Take 40 mg by mouth daily.   ibuprofen 600 MG tablet Commonly known as:  ADVIL,MOTRIN Take 1 tablet (600 mg total) by mouth every 6 (six) hours.   multivitamin-prenatal 27-0.8 MG Tabs tablet Take 1 tablet by mouth daily at 12 noon.       Follow-up Information    Christeen Douglas, MD Follow up in 6 week(s).   Specialty:  Obstetrics and Gynecology Why:  PP care Contact information: [address] [place] [address] [phone]        Christeen Douglas, MD .   Specialty:  Obstetrics and Gynecology Contact information: [phone] [place] [address] [phone]        Dalia Heading, MD Follow up in 1 week(s).   Specialty:  Cardiology Why:  post Holter monitoring  Contact information: [address] [facility] [address] [facility] [place] - CARDIOLOGY [place] [address] [phone]           Signed: Jennell Corner MD  Hit refresh and delete this line

## 2016-08-17 NOTE — Progress Notes (Signed)
Notified Dr. Anne HahnWillis of 2.6 second pause. No s/s. VSS. Sleeping peacefully.

## 2016-08-17 NOTE — Progress Notes (Signed)
A&O. Independent. Meds given at beginning of shift for nausea. SLept well after that for the rest of the night. Heart rate staying in the 40's.  Sl

## 2016-08-18 ENCOUNTER — Emergency Department: Payer: Medicaid Other

## 2016-08-18 ENCOUNTER — Emergency Department
Admission: EM | Admit: 2016-08-18 | Discharge: 2016-08-18 | Disposition: A | Discharge status: Home / Self Care | Payer: Medicaid Other | Attending: Emergency Medicine | Admitting: Emergency Medicine

## 2016-08-18 ENCOUNTER — Encounter: Payer: Self-pay | Admitting: *Deleted

## 2016-08-18 DIAGNOSIS — E860 Dehydration: Secondary | ICD-10-CM | POA: Diagnosis not present

## 2016-08-18 DIAGNOSIS — Z79899 Other long term (current) drug therapy: Secondary | ICD-10-CM | POA: Diagnosis not present

## 2016-08-18 DIAGNOSIS — Z791 Long term (current) use of non-steroidal anti-inflammatories (NSAID): Secondary | ICD-10-CM | POA: Diagnosis not present

## 2016-08-18 DIAGNOSIS — R001 Bradycardia, unspecified: Secondary | ICD-10-CM | POA: Diagnosis not present

## 2016-08-18 DIAGNOSIS — O99285 Endocrine, nutritional and metabolic diseases complicating the puerperium: Secondary | ICD-10-CM | POA: Insufficient documentation

## 2016-08-18 DIAGNOSIS — J45909 Unspecified asthma, uncomplicated: Secondary | ICD-10-CM | POA: Diagnosis not present

## 2016-08-18 DIAGNOSIS — O9989 Other specified diseases and conditions complicating pregnancy, childbirth and the puerperium: Secondary | ICD-10-CM | POA: Insufficient documentation

## 2016-08-18 DIAGNOSIS — R1110 Vomiting, unspecified: Secondary | ICD-10-CM

## 2016-08-18 DIAGNOSIS — O99335 Smoking (tobacco) complicating the puerperium: Secondary | ICD-10-CM | POA: Diagnosis not present

## 2016-08-18 DIAGNOSIS — F17210 Nicotine dependence, cigarettes, uncomplicated: Secondary | ICD-10-CM | POA: Insufficient documentation

## 2016-08-18 DIAGNOSIS — R109 Unspecified abdominal pain: Secondary | ICD-10-CM | POA: Diagnosis not present

## 2016-08-18 DIAGNOSIS — R197 Diarrhea, unspecified: Secondary | ICD-10-CM

## 2016-08-18 LAB — DIFFERENTIAL
Basophils Absolute: 0 10*3/uL (ref 0–0.1)
Basophils Relative: 0 %
Eosinophils Absolute: 0 10*3/uL (ref 0–0.7)
Eosinophils Relative: 0 %
LYMPHS ABS: 2 10*3/uL (ref 1.0–3.6)
Lymphocytes Relative: 13 %
MONO ABS: 1.4 10*3/uL — AB (ref 0.2–0.9)
Monocytes Relative: 9 %
NEUTROS PCT: 78 %
Neutro Abs: 12.3 10*3/uL — ABNORMAL HIGH (ref 1.4–6.5)

## 2016-08-18 LAB — CBC
HCT: 39.2 % (ref 35.0–47.0)
Hemoglobin: 13.6 g/dL (ref 12.0–16.0)
MCH: 32.7 pg (ref 26.0–34.0)
MCHC: 34.8 g/dL (ref 32.0–36.0)
MCV: 94 fL (ref 80.0–100.0)
PLATELETS: 312 10*3/uL (ref 150–440)
RBC: 4.17 MIL/uL (ref 3.80–5.20)
RDW: 12.9 % (ref 11.5–14.5)
WBC: 15.4 10*3/uL — ABNORMAL HIGH (ref 3.6–11.0)

## 2016-08-18 LAB — TSH: TSH: 0.96 u[IU]/mL (ref 0.350–4.500)

## 2016-08-18 LAB — URINALYSIS, COMPLETE (UACMP) WITH MICROSCOPIC
BILIRUBIN URINE: NEGATIVE
Bacteria, UA: NONE SEEN
GLUCOSE, UA: NEGATIVE mg/dL
KETONES UR: 20 mg/dL — AB
Nitrite: NEGATIVE
PH: 8 (ref 5.0–8.0)
Protein, ur: NEGATIVE mg/dL
Specific Gravity, Urine: 1.013 (ref 1.005–1.030)

## 2016-08-18 LAB — URINE DRUG SCREEN, QUALITATIVE (ARMC ONLY)
Amphetamines, Ur Screen: NOT DETECTED
BARBITURATES, UR SCREEN: NOT DETECTED
BENZODIAZEPINE, UR SCRN: NOT DETECTED
CANNABINOID 50 NG, UR ~~LOC~~: POSITIVE — AB
COCAINE METABOLITE, UR ~~LOC~~: NOT DETECTED
MDMA (Ecstasy)Ur Screen: NOT DETECTED
METHADONE SCREEN, URINE: NOT DETECTED
OPIATE, UR SCREEN: NOT DETECTED
PHENCYCLIDINE (PCP) UR S: NOT DETECTED
Tricyclic, Ur Screen: NOT DETECTED

## 2016-08-18 LAB — HEPATIC FUNCTION PANEL
ALBUMIN: 2.9 g/dL — AB (ref 3.5–5.0)
ALK PHOS: 127 U/L — AB (ref 38–126)
ALT: 62 U/L — ABNORMAL HIGH (ref 14–54)
AST: 49 U/L — ABNORMAL HIGH (ref 15–41)
BILIRUBIN TOTAL: 1.1 mg/dL (ref 0.3–1.2)
Bilirubin, Direct: 0.1 mg/dL (ref 0.1–0.5)
Indirect Bilirubin: 1 mg/dL — ABNORMAL HIGH (ref 0.3–0.9)
Total Protein: 6.6 g/dL (ref 6.5–8.1)

## 2016-08-18 LAB — LIPASE, BLOOD: Lipase: 16 U/L (ref 11–51)

## 2016-08-18 LAB — BASIC METABOLIC PANEL
Anion gap: 10 (ref 5–15)
BUN: 7 mg/dL (ref 6–20)
CALCIUM: 8.7 mg/dL — AB (ref 8.9–10.3)
CO2: 19 mmol/L — ABNORMAL LOW (ref 22–32)
CREATININE: 0.66 mg/dL (ref 0.44–1.00)
Chloride: 107 mmol/L (ref 101–111)
Glucose, Bld: 87 mg/dL (ref 65–99)
Potassium: 3.1 mmol/L — ABNORMAL LOW (ref 3.5–5.1)
SODIUM: 136 mmol/L (ref 135–145)

## 2016-08-18 LAB — TROPONIN I: Troponin I: <0.03 ng/mL (ref <0.03)

## 2016-08-18 LAB — BRAIN NATRIURETIC PEPTIDE: B Natriuretic Peptide: 126 pg/mL — ABNORMAL HIGH (ref 0.0–100.0)

## 2016-08-18 MED ORDER — ONDANSETRON 4 MG PO TBDP: 4.0000 mg | ORAL_TABLET | Freq: Three times a day (TID) | ORAL | 0 refills | Status: DC | PRN

## 2016-08-18 MED ORDER — IOPAMIDOL (ISOVUE-300) INJECTION 61%
75.0000 mL | Freq: Once | INTRAVENOUS | Status: AC | PRN
  Administered 2016-08-18: 75 mL via INTRAVENOUS

## 2016-08-18 MED ORDER — MORPHINE SULFATE (PF) 4 MG/ML IV SOLN
4.0000 mg | Freq: Once | INTRAVENOUS | Status: DC
  Filled 2016-08-18: qty 1

## 2016-08-18 MED ORDER — IOPAMIDOL (ISOVUE-300) INJECTION 61%
30.0000 mL | Freq: Once | INTRAVENOUS | Status: AC | PRN
  Administered 2016-08-18: 30 mL via ORAL

## 2016-08-18 MED ORDER — MORPHINE SULFATE (PF) 2 MG/ML IV SOLN
2.0000 mg | Freq: Once | INTRAVENOUS | Status: AC
  Administered 2016-08-18: 2 mg via INTRAVENOUS
  Filled 2016-08-18: qty 1

## 2016-08-18 MED ORDER — SODIUM CHLORIDE 0.9 % IV SOLN
Freq: Once | INTRAVENOUS | Status: AC
  Administered 2016-08-18: 19:00:00 via INTRAVENOUS

## 2016-08-18 MED ORDER — SODIUM CHLORIDE 0.9 % IV SOLN
Freq: Once | INTRAVENOUS | Status: AC
  Administered 2016-08-18: 12:00:00 via INTRAVENOUS

## 2016-08-18 MED ORDER — BISMUTH SUBSALICYLATE 262 MG/15ML PO SUSP
30.0000 mL | Freq: Once | ORAL | Status: AC
  Administered 2016-08-18: 30 mL via ORAL
  Filled 2016-08-18: qty 118

## 2016-08-18 MED ORDER — DIATRIZOATE MEGLUMINE & SODIUM 66-10 % PO SOLN: 15.0000 mL | Freq: Once | ORAL | Status: DC

## 2016-08-18 MED ORDER — MORPHINE SULFATE (PF) 4 MG/ML IV SOLN
4.0000 mg | Freq: Once | INTRAVENOUS | Status: AC
  Administered 2016-08-18: 4 mg via INTRAVENOUS
  Filled 2016-08-18: qty 1

## 2016-08-18 MED ORDER — PROMETHAZINE HCL 25 MG/ML IJ SOLN
12.5000 mg | Freq: Once | INTRAMUSCULAR | Status: AC
  Administered 2016-08-18: 12.5 mg via INTRAVENOUS
  Filled 2016-08-18: qty 1

## 2016-08-18 NOTE — ED Triage Notes (Signed)
Pt is postpartum, delivered on 3/23, pt is currently wearing a Holter monitor placed on pt after delivery for ? bradycardia, pt complains of dizziness, fatigue, pt  Is alert and oriented , pale in color

## 2016-08-18 NOTE — ED Notes (Signed)
Pt reports stomach feels better, does not want pain medicine at this time. Unable to drink anymore contrast, MD notified. CT will come get pt.

## 2016-08-18 NOTE — ED Notes (Signed)
Checked in on patient, updated pt on status. Call bell in patients reach. Pt resting, reports she is feeling a little better.

## 2016-08-18 NOTE — Discharge Instructions (Signed)
Please drink small amounts of clear fluids frequently for today. Tomorrow try the bananas rice applesauce toaster brat diet. By lunch time she got it regular food. Use the Zofran melt on your tongue one pill 3 times a day as needed for nausea and vomiting. He continues Pepto-Bismol as instructed on the bottle for the diarrhea. Please follow-up with your regular doctor. The doctor and your cardiologist as planned. Please return here for worsening weakness any fever pain or inability to keep down fluids.

## 2016-08-18 NOTE — Consult Note (Signed)
Consult History and Physical   SERVICE: Obstetrics  Patient Name: Casey Owens Patient MRN:   [id number]  CC: nausea and vomiting, feeling light headed  HPI: Casey Owens is a 26 y.o. [id number] who delivered preterm at 34 weeks, on 08/16/16 and was found to have bradycardia after delivery.  She was on telemonitoring and seen while inpatient by cardiology.  She was found to have blocked PACs and stable baseline rate of high 30s-low 40s.  She had an ECHO which showed 65% ejection fraction and normal LV function.  She was discharged on a Holter monitor for 48hrs, with instruction to return to the ED if she began having any lightheadedness, dizziness or other concerning symptoms.   Today she woke up and began vomiting, and after this was lightheaded.  At first she waited to come to the ED, but when vomiting reoccurred as did the lightheadedness, she reported here.  She says she has had problems with nausea and vomiting before and throughout pregnancy, and was told she had some issue with her gall bladder "swelling" and it has improved but not resolved.  In the last two weeks the N/V has gotten worse.  She also had had dark liquid diarrhea during this time as well.  Since delivery, this has not changed, nor has it really gotten any worse - she was mainly concerned about the lightheadedness.  She thought she had some kind of GI bug, but it has been a few weeks so she's not so sure. Last BM was early this morning and again dark liquid.  She also had a similar bradycardia after her last delivery and is s/p ECHO in 02/2015 that did not show any cardiomyopathy.  Her bradycardia resolved between pregnancies.  Dr. Juliette AlcideMelinda saw this patient in the ED, ordered several labs, and a CT scan.  He asked for my input.     Review of Systems: positives in bold GEN:   fevers, chills, weight changes, appetite changes, fatigue, night sweats HEENT:  HA, vision changes, hearing loss, congestion, rhinorrhea, sinus  pressure, dysphagia CV:   CP, palpitations PULM:  SOB, cough GI:  abd pain, N/V/D/C GU:  dysuria, urgency, frequency MSK:  arthralgias, myalgias, back pain, swelling SKIN:  rashes, color changes, pallor NEURO:  numbness, weakness, tingling, seizures, dizziness( not spinning, more lightheaded), tremors PSYCH:  depression, anxiety, behavioral problems, confusion  HEME/LYMPH:  easy bruising or bleeding ENDO:  heat/cold intolerance  Past Obstetrical History: OB History    Gravida Para Term Preterm AB Living   4 4 1 3   4    SAB TAB Ectopic Multiple Live Births         0 4      Past Gynecologic History: She is s/p vaginal preterm delivery 2 days ago.  Past Medical History: Past Medical History:  Diagnosis Date  . Asthma   . Depression   . 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 NO PAST SURGERIES      Family History:  No gyn cancers  Social History:  Social History   Social History  . Marital status: Single    Spouse name: N/A  . Number of children: N/A  . Years of education: N/A   Occupational History  . Not on file.   Social History Main Topics  . Smoking status: Current Every Day Smoker    Packs/day:  0.25    Years: 10.00    Types: Cigarettes  . Smokeless tobacco: Never Used  . Alcohol use No  . Drug use: Yes    Types: Cocaine, Marijuana     Comment: says quit drug use in January  . Sexual activity: Yes    Birth control/ protection: None, Inserts   Other Topics Concern  . Not on file   Social History Narrative  . No narrative on file    Home Medications:  Medications reconciled in EPIC  No current facility-administered medications on file prior to encounter.    Current Outpatient Prescriptions on File Prior to Encounter  Medication Sig Dispense Refill  . FLUoxetine (PROZAC) 40 MG capsule Take 40 mg by mouth daily.     . benzocaine-Menthol (DERMOPLAST) 20-0.5 % AERO Apply 1 application topically as needed for irritation (perineal discomfort). (Patient not taking: Reported on 08/18/2016) 1 g 1  . ibuprofen (ADVIL,MOTRIN) 600 MG tablet Take 1 tablet (600 mg total) by mouth every 6 (six) hours. (Patient not taking: Reported on 08/18/2016) 30 tablet 0    Allergies:  Allergies  Allergen Reactions  . Shellfish Allergy Swelling    Physical Exam:  Temp:  [98.4 F (36.9 C)-98.6 F (37 C)] 98.6 F (37 C) (03/25 2023) Pulse Rate:  [40-72] 49 (03/25 2023) Resp:  [15-20] 15 (03/25 2023) BP: (119-163)/(49-105) 154/99 (03/25 2023) SpO2:  [96 %-100 %] 98 % (03/25 2023) Weight:  [56.7 kg (125 lb)] 56.7 kg (125 lb) (03/25 1038)   General Appearance:  Well developed, well nourished, no acute distress, alert and oriented, cooperative and appears stated age HEENT:  Normocephalic atraumatic, extraocular movements intact, moist mucous membranes, neck supple with midline trachea and thyroid without masses Cardiovascular:  Normal S1/S2, regular rate and rhythm, no murmurs, 2+ distal pulses Pulmonary:  clear to auscultation, no wheezes, rales or rhonchi, symmetric air entry, good air exchange Abdomen:  Bowel sounds present, soft, tender on LUQ <LLQ, nondistended, no abnormal masses or organomegaly, no epigastric pain. Post-gravid uterus 4cm below umbilicus and NOT TENDER  Back: inspection of back is normal Extremities:  extremities normal, no tenderness, atraumatic, no cyanosis or edema Skin:  normal coloration and turgor, no rashes, no suspicious skin lesions noted  Neurologic:  Cranial nerves 2-12 grossly intact, grossly equal strength and muscle tone, normal speech, no focal findings or movement disorder noted. Psychiatric:  Normal mood and affect, appropriate, no AH/VH Pelvic:  deferred   Labs/Studies:   Results for orders placed or performed during the hospital encounter of 08/18/16 (from the past 24 hour(s))   Basic metabolic panel     Status: Abnormal   Collection Time: 08/18/16 10:53 AM  Result Value Ref Range   Sodium 136 135 - 145 mmol/L   Potassium 3.1 (L) 3.5 - 5.1 mmol/L   Chloride 107 101 - 111 mmol/L   CO2 19 (L) 22 - 32 mmol/L   Glucose, Bld 87 65 - 99 mg/dL   BUN 7 6 - 20 mg/dL   Creatinine, Ser 1.61 0.44 - 1.00 mg/dL   Calcium 8.7 (L) 8.9 - 10.3 mg/dL   GFR calc non Af Amer >60 >60 mL/min   GFR calc Af Amer >60 >60 mL/min   Anion gap 10 5 - 15  CBC     Status: Abnormal   Collection Time: 08/18/16 10:53 AM  Result Value Ref Range   WBC 15.4 (H) 3.6 - 11.0 K/uL   RBC 4.17 3.80 - 5.20 MIL/uL   Hemoglobin  13.6 12.0 - 16.0 g/dL   HCT 16.1 09.6 - 04.5 %   MCV 94.0 80.0 - 100.0 fL   MCH 32.7 26.0 - 34.0 pg   MCHC 34.8 32.0 - 36.0 g/dL   RDW 40.9 81.1 - 91.4 %   Platelets 312 150 - 440 K/uL  Urinalysis, Complete w Microscopic     Status: Abnormal   Collection Time: 08/18/16 10:53 AM  Result Value Ref Range   Color, Urine YELLOW (A) YELLOW   APPearance CLEAR (A) CLEAR   Specific Gravity, Urine 1.013 1.005 - 1.030   pH 8.0 5.0 - 8.0   Glucose, UA NEGATIVE NEGATIVE mg/dL   Hgb urine dipstick LARGE (A) NEGATIVE   Bilirubin Urine NEGATIVE NEGATIVE   Ketones, ur 20 (A) NEGATIVE mg/dL   Protein, ur NEGATIVE NEGATIVE mg/dL   Nitrite NEGATIVE NEGATIVE   Leukocytes, UA TRACE (A) NEGATIVE   RBC / HPF TOO NUMEROUS TO COUNT 0 - 5 RBC/hpf   WBC, UA 6-30 0 - 5 WBC/hpf   Bacteria, UA NONE SEEN NONE SEEN   Squamous Epithelial / LPF 0-5 (A) NONE SEEN   Mucous PRESENT   Troponin I     Status: None   Collection Time: 08/18/16 10:53 AM  Result Value Ref Range   Troponin I <0.03 <0.03 ng/mL  Hepatic function panel     Status: Abnormal   Collection Time: 08/18/16 10:53 AM  Result Value Ref Range   Total Protein 6.6 6.5 - 8.1 g/dL   Albumin 2.9 (L) 3.5 - 5.0 g/dL   AST 49 (H) 15 - 41 U/L   ALT 62 (H) 14 - 54 U/L   Alkaline Phosphatase 127 (H) 38 - 126 U/L   Total Bilirubin 1.1 0.3 -  1.2 mg/dL   Bilirubin, Direct 0.1 0.1 - 0.5 mg/dL   Indirect Bilirubin 1.0 (H) 0.3 - 0.9 mg/dL  Differential     Status: Abnormal   Collection Time: 08/18/16 10:53 AM  Result Value Ref Range   Neutrophils Relative % 78 %   Neutro Abs 12.3 (H) 1.4 - 6.5 K/uL   Lymphocytes Relative 13 %   Lymphs Abs 2.0 1.0 - 3.6 K/uL   Monocytes Relative 9 %   Monocytes Absolute 1.4 (H) 0.2 - 0.9 K/uL   Eosinophils Relative 0 %   Eosinophils Absolute 0.0 0 - 0.7 K/uL   Basophils Relative 0 %   Basophils Absolute 0.0 0 - 0.1 K/uL  Lipase, blood     Status: None   Collection Time: 08/18/16 10:53 AM  Result Value Ref Range   Lipase 16 11 - 51 U/L  TSH     Status: None   Collection Time: 08/18/16 10:53 AM  Result Value Ref Range   TSH 0.960 0.350 - 4.500 uIU/mL  Urine Drug Screen, Qualitative     Status: Abnormal   Collection Time: 08/18/16 11:25 AM  Result Value Ref Range   Tricyclic, Ur Screen NONE DETECTED NONE DETECTED   Amphetamines, Ur Screen NONE DETECTED NONE DETECTED   MDMA (Ecstasy)Ur Screen NONE DETECTED NONE DETECTED   Cocaine Metabolite,Ur [place] NONE DETECTED NONE DETECTED   Opiate, Ur Screen NONE DETECTED NONE DETECTED   Phencyclidine (PCP) Ur S NONE DETECTED NONE DETECTED   Cannabinoid 50 Ng, Ur [place] POSITIVE (A) NONE DETECTED   Barbiturates, Ur Screen NONE DETECTED NONE DETECTED   Benzodiazepine, Ur Scrn NONE DETECTED NONE DETECTED   Methadone Scn, Ur NONE DETECTED NONE DETECTED  Brain natriuretic peptide  Status: Abnormal   Collection Time: 08/18/16  5:38 PM  Result Value Ref Range   B Natriuretic Peptide 126.0 (H) 0.0 - 100.0 pg/mL    Other Imaging: Ct Abdomen Pelvis W Contrast  Result Date: 08/18/2016 CLINICAL DATA:  26 year old female postpartum female with left side abdominal pain. Delivered yesterday. EXAM: CT ABDOMEN AND PELVIS WITH CONTRAST TECHNIQUE: Multidetector CT imaging of the abdomen and pelvis was performed using the standard protocol following bolus  administration of intravenous contrast. CONTRAST:  75mL ISOVUE-300 IOPAMIDOL (ISOVUE-300) INJECTION 61% COMPARISON:  CT Abdomen and Pelvis 10/30/2015. FINDINGS: Lower chest: Minor atelectasis at the lung bases. No pericardial or pleural effusion. Hepatobiliary: Negative liver and gallbladder. No abdominal free fluid or free air. Pancreas: Negative. Spleen: Negative. Adrenals/Urinary Tract: Normal adrenal glands. Bilateral renal enhancement is normal. No perinephric stranding. No hydronephrosis or proximal hydroureter. Unremarkable urinary bladder. Stomach/Bowel: Decompressed rectosigmoid colon. Occasional left colon diverticula without active inflammation. The left colon is decompressed. The transverse colon is redundant but decompressed. Mildly redundant hepatic flexure. Retrocecal appendix tracks toward the inferior liver margin and contains gas. It does not appear inflamed although the appendix diameter is chronically larger than typical (9 mm). No dilated small bowel. Oral contrast in the stomach. Small sliding-type gastric hiatal hernia. Oral contrast in the duodenum and at the ligament of Treitz. Vascular/Lymphatic: Major arterial structures in the abdomen and pelvis are patent and appear normal. The portal venous system appears patent. No lymphadenopathy identified. Reproductive: Postpartum uterus, enlarged and mildly heterogeneous but not definitely inflamed. There is a small volume of free fluid at both lower quadrants along the uterine fundus. There is a small area of enhancement in the lower endometrial cavity best seen on sagittal image 56. The ovaries are not well delineated. Other: No definite pelvic free fluid. Musculoskeletal: Mild scoliosis. No acute osseous abnormality identified. IMPRESSION: 1. Enlarged postpartum uterus is mildly heterogeneous but not definitely inflamed. Mild nonspecific enhancement of the lower endometrium. Trace lower quadrant free fluid is likely physiologic. 2. Otherwise  negative abdomen and pelvis. Electronically Signed   By: Odessa Fleming M.D.   On: 08/18/2016 15:54   Dg Chest Portable 1 View  Result Date: 08/18/2016 CLINICAL DATA:  Post partum X 1 day-pt began vomiting this am about 0420. Shielded. Smoker. Hx of asthma. EXAM: PORTABLE CHEST 1 VIEW COMPARISON:  03/11/2007 FINDINGS: The heart size and mediastinal contours are within normal limits. Both lungs are clear. The visualized skeletal structures are unremarkable. IMPRESSION: No active disease. Electronically Signed   By: Norva Pavlov M.D.   On: 08/18/2016 11:58   Dg Abd 2 Views  Result Date: 08/18/2016 CLINICAL DATA:  Patient with vomiting.  Postpartum. EXAM: ABDOMEN - 2 VIEW COMPARISON:  Chest radiograph earlier same day FINDINGS: Lung bases are clear. Gas is demonstrated within nondilated loops of large and small bowel in a nonobstructed pattern. Osseous skeleton is unremarkable. IMPRESSION: Nonobstructed bowel gas pattern. Electronically Signed   By: Annia Belt M.D.   On: 08/18/2016 12:40     Assessment / Plan:   Casey Owens is a 26 y.o. [id number] who presents with lightheadedness in the setting of bradycardia and persistent N/V/D after preterm vaginal delivery.  1. Bradycardia:  Patient is in and out of 30s-40s baseline during my encounter with her.  She is entirely asymptomatic at this rate.  When she talks and is animated it is in the 40s.   2. N/V/D - this was happening well before delivery and continues after, I"m wondering  if has anything do to with her pregnancy at all.  Her gallbladder was not significantly enlarged on CT.  Prominent splenic vessels (not reported).  She could have a colitis, unresolved, with the continued diarrhea. 3. Postpartum:  No evidence of endometritis.  Leukocytosis is resolving, 15 now from 25 during admission.  I cannot speak from the cardiology standpoint - her bradycardia is concerning, but hasn't changed since she was discharged, so whether this warrants  inpatient admission I cannot say.  She does not seem to have an obstetric or postpartum concern.  We should focus on treatment/diagnosis of her GI disturbance, which is likely causing her Left-sided abdominal tenderness on exam, and request cardiology input.    Thank you for the opportunity to be involved with this patient's care.  ----- Ranae Plumber, MD Attending Obstetrician and Gynecologist [facility], Department of OB/GYN [facility]

## 2016-08-18 NOTE — ED Notes (Signed)
Patient was placed into room #18. Patient was hooked up to the heart monitor. Nurse met me in the room.

## 2016-08-18 NOTE — ED Notes (Signed)
Pt up to use bathroom. Warm blanket given.

## 2016-08-18 NOTE — ED Provider Notes (Signed)
[facility] Emergency Department Provider Note   ____________________________________________   First MD Initiated Contact with Patient 08/18/16 1115     (approximate)  I have reviewed the triage vital signs and the nursing notes.   HISTORY  Chief Complaint Fatigue and Dizziness    HPI Casey Owens is a 26 y.o. female patient complains of feeling weak and dizzy. She reports she began having nausea and vomiting this morning about 4:00. She is actively vomiting in the ER. The vomit is yellow. She also complains of crampy pain in the left mid abdomen. She reports it's been there since before delivery. She delivered on the 23rd of this month 2 days ago. He was 33 weeks by dates. Normal spontaneous vaginal delivery. Patient has been using cocaine and marijuana during her pregnancy.   Past Medical History:  Diagnosis Date  . Asthma   . Depression   . Substance abuse affecting pregnancy, antepartum 02/09/2015   Positive for [facility] & Cocaine    Patient Active Problem List   Diagnosis Date Noted  . Preterm labor in third trimester with preterm delivery 08/16/2016  . Labor and delivery, indication for care 06/08/2016  . Pelvic pain affecting pregnancy in second trimester, antepartum 04/17/2016  . Pelvic pain affecting pregnancy 04/16/2016  . Bradycardia 03/24/2015  . Indication for care in labor and delivery, delivered 03/24/2015  . Indication for care or intervention related to labor and delivery 03/23/2015  . Abdominal pain 03/17/2015  . Preterm labor 03/17/2015  . 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 Tobacco use in pregnancy 02/10/2015  . Threatened preterm labor, antepartum 02/09/2015  . Adjustment disorder with mixed anxiety and depressed mood 11/17/2014  . Pregnant 11/17/2014  . Cannabis abuse 11/17/2014    Past Surgical History:  Procedure Laterality Date  . NO PAST SURGERIES      Prior to Admission  medications   Medication Sig Start Date End Date Taking? Authorizing Provider  FLUoxetine (PROZAC) 40 MG capsule Take 40 mg by mouth daily.   Yes Historical Provider, MD  benzocaine-Menthol (DERMOPLAST) 20-0.5 % AERO Apply 1 application topically as needed for irritation (perineal discomfort). Patient not taking: Reported on 08/18/2016 08/17/16   Ihor Austin Schermerhorn, MD  ibuprofen (ADVIL,MOTRIN) 600 MG tablet Take 1 tablet (600 mg total) by mouth every 6 (six) hours. Patient not taking: Reported on 08/18/2016 08/17/16   Ihor Austin Schermerhorn, MD  ondansetron (ZOFRAN ODT) 4 MG disintegrating tablet Take 1 tablet (4 mg total) by mouth every 8 (eight) hours as needed for nausea or vomiting. 08/18/16   Arnaldo Natal, MD    Allergies Shellfish allergy  No family history on file.  Social History Social History  Substance Use Topics  . Smoking status: Current Every Day Smoker    Packs/day: 0.25    Years: 10.00    Types: Cigarettes  . Smokeless tobacco: Never Used  . Alcohol use No    Review of Systems Constitutional: No fever/chills Eyes: No visual changes. ENT: No sore throat. Cardiovascular: Denies chest pain. Respiratory: Denies shortness of breath. Gastrointestinal abdominal pain.   nausea,vomiting.  No diarrhea.  No constipation. Genitourinary: Negative for dysuria. Musculoskeletal: Negative for back pain. Skin: Negative for rash. Neurological: Negative for headaches, focal weakness or numbness.  10-point ROS otherwise negative.  ____________________________________________   PHYSICAL EXAM:  VITAL SIGNS: ED Triage Vitals [08/18/16 1038]  Enc Vitals Group     BP (!) 154/105     Pulse Rate  63     Resp 20     Temp 98.4 F (36.9 C)     Temp Source Oral     SpO2 100 %     Weight 125 lb (56.7 kg)     Height 5\' 4"  (1.626 m)     Head Circumference      Peak Flow      Pain Score 10     Pain Loc      Pain Edu?      Excl. in GC?    Constitutional: Alert and oriented.  Well appearing and in no acute distress. Eyes: Conjunctivae are normal. PERRL. EOMI. Head: Atraumatic. Nose: No congestion/rhinnorhea. Mouth/Throat: Mucous membranes are moist.  Oropharynx non-erythematous. Neck: No stridor.   Cardiovascular: Normal rate, regular rhythm. Grossly normal heart sounds.  Good peripheral circulation. Respiratory: Normal respiratory effort.  No retractions. Lungs CTAB. Gastrointestinal: Soft, left-sided tenderness to palpation No distention. No abdominal bruits. No CVA tenderness. Musculoskeletal: No lower extremity tenderness nor edema.  No joint effusions. Neurologic:  Normal speech and language. No gross focal neurologic deficits are appreciated.  Skin:  Skin is warm, dry and intact. No rash noted. Psychiatric: Mood and affect are normal. Speech and behavior are normal.  ____________________________________________   LABS (all labs ordered are listed, but only abnormal results are displayed)  Labs Reviewed  BASIC METABOLIC PANEL - Abnormal; Notable for the following:       Result Value   Potassium 3.1 (*)    CO2 19 (*)    Calcium 8.7 (*)    All other components within normal limits  CBC - Abnormal; Notable for the following:    WBC 15.4 (*)    All other components within normal limits  URINALYSIS, COMPLETE (UACMP) WITH MICROSCOPIC - Abnormal; Notable for the following:    Color, Urine YELLOW (*)    APPearance CLEAR (*)    Hgb urine dipstick LARGE (*)    Ketones, ur 20 (*)    Leukocytes, UA TRACE (*)    Squamous Epithelial / LPF 0-5 (*)    All other components within normal limits  HEPATIC FUNCTION PANEL - Abnormal; Notable for the following:    Albumin 2.9 (*)    AST 49 (*)    ALT 62 (*)    Alkaline Phosphatase 127 (*)    Indirect Bilirubin 1.0 (*)    All other components within normal limits  DIFFERENTIAL - Abnormal; Notable for the following:    Neutro Abs 12.3 (*)    Monocytes Absolute 1.4 (*)    All other components within normal  limits  URINE DRUG SCREEN, QUALITATIVE ([facility] ONLY) - Abnormal; Notable for the following:    Cannabinoid 50 Ng, Ur [place] POSITIVE (*)    All other components within normal limits  BRAIN NATRIURETIC PEPTIDE - Abnormal; Notable for the following:    B Natriuretic Peptide 126.0 (*)    All other components within normal limits  TROPONIN I  LIPASE, BLOOD  TSH  CBG MONITORING, ED   ____________________________________________  EKG  EKG read and interpreted by me shows normal shows sinus bradycardia at a rate of 57 normal axis no acute ST-T wave changes ____________________________________________  RADIOLOGY Study Result   CLINICAL DATA:  Post partum X 1 day-pt began vomiting this am about 0420. Shielded. Smoker. Hx of asthma.  EXAM: PORTABLE CHEST 1 VIEW  COMPARISON:  03/11/2007  FINDINGS: The heart size and mediastinal contours are within normal limits. Both lungs are clear. The visualized  skeletal structures are unremarkable.  IMPRESSION: No active disease.   Electronically Signed   By: Norva PavlovElizabeth  Brown M.D.   On: 08/18/2016 11:58    Study Result   CLINICAL DATA:  Patient with vomiting.  Postpartum.  EXAM: ABDOMEN - 2 VIEW  COMPARISON:  Chest radiograph earlier same day  FINDINGS: Lung bases are clear. Gas is demonstrated within nondilated loops of large and small bowel in a nonobstructed pattern. Osseous skeleton is unremarkable.  IMPRESSION: Nonobstructed bowel gas pattern.   Electronically Signed   By: Annia Beltrew  Davis M.D.   On: 08/18/2016 12:40   Study Result   CLINICAL DATA:  26 year old female postpartum female with left side abdominal pain. Delivered yesterday.  EXAM: CT ABDOMEN AND PELVIS WITH CONTRAST  TECHNIQUE: Multidetector CT imaging of the abdomen and pelvis was performed using the standard protocol following bolus administration of intravenous contrast.  CONTRAST:  75mL ISOVUE-300 IOPAMIDOL (ISOVUE-300) INJECTION  61%  COMPARISON:  CT Abdomen and Pelvis 10/30/2015.  FINDINGS: Lower chest: Minor atelectasis at the lung bases. No pericardial or pleural effusion.  Hepatobiliary: Negative liver and gallbladder.  No abdominal free fluid or free air.  Pancreas: Negative.  Spleen: Negative.  Adrenals/Urinary Tract: Normal adrenal glands. Bilateral renal enhancement is normal. No perinephric stranding. No hydronephrosis or proximal hydroureter.  Unremarkable urinary bladder.  Stomach/Bowel: Decompressed rectosigmoid colon. Occasional left colon diverticula without active inflammation. The left colon is decompressed. The transverse colon is redundant but decompressed. Mildly redundant hepatic flexure. Retrocecal appendix tracks toward the inferior liver margin and contains gas. It does not appear inflamed although the appendix diameter is chronically larger than typical (9 mm).  No dilated small bowel. Oral contrast in the stomach. Small sliding-type gastric hiatal hernia. Oral contrast in the duodenum and at the ligament of Treitz.  Vascular/Lymphatic: Major arterial structures in the abdomen and pelvis are patent and appear normal. The portal venous system appears patent.  No lymphadenopathy identified.  Reproductive: Postpartum uterus, enlarged and mildly heterogeneous but not definitely inflamed. There is a small volume of free fluid at both lower quadrants along the uterine fundus. There is a small area of enhancement in the lower endometrial cavity best seen on sagittal image 56.  The ovaries are not well delineated.  Other: No definite pelvic free fluid.  Musculoskeletal: Mild scoliosis. No acute osseous abnormality identified.  IMPRESSION: 1. Enlarged postpartum uterus is mildly heterogeneous but not definitely inflamed. Mild nonspecific enhancement of the lower endometrium. Trace lower quadrant free fluid is likely physiologic. 2. Otherwise negative  abdomen and pelvis.   Electronically Signed   By: Odessa FlemingH  Hall M.D.   On: 08/18/2016 15:54     ____________________________________________   PROCEDURES  Procedure(s) performed:  Procedures  Critical Care performed:   ____________________________________________   INITIAL IMPRESSION / ASSESSMENT AND PLAN / ED COURSE  Pertinent labs & imaging results that were available during my care of the patient were reviewed by me and considered in my medical decision making (see chart for details).  ----------------------------------------- 4:12 PM on 08/18/2016 -----------------------------------------  Reexamination of the belly shows that the tenderness persists at the umbilicus and below. Early bad patient still says the pains quite severe and she still has been crying. She said this is nothing like her previous pregnancy.      ____________________________________________   FINAL CLINICAL IMPRESSION(S) / ED DIAGNOSES  Final diagnoses:  Dehydration  Diarrhea, unspecified type  Bradycardia      NEW MEDICATIONS STARTED DURING THIS VISIT:  Discharge Medication List  as of 08/18/2016  6:39 PM    START taking these medications   Details  ondansetron (ZOFRAN ODT) 4 MG disintegrating tablet Take 1 tablet (4 mg total) by mouth every 8 (eight) hours as needed for nausea or vomiting., Starting Sun 08/18/2016, Print         Note:  This document was prepared using Dragon voice recognition software and may include unintentional dictation errors.    Arnaldo Natal, MD 08/18/16 [phone]

## 2016-08-19 LAB — SURGICAL PATHOLOGY

## 2016-08-19 NOTE — Anesthesia Postprocedure Evaluation (Signed)
Anesthesia Post Note  Patient: Casey Owens  Procedure(s) Performed: * No procedures listed *  Anesthesia Type: Epidural Comments: Patient was not seen by postoperative team while she was in the hospital.  Phone number on file called multiple times with no response.     Last Vitals: There were no vitals filed for this visit.  Last Pain: There were no vitals filed for this visit.               Cleda MccreedyJoseph K Kharter Sestak

## 2017-04-23 ENCOUNTER — Encounter (HOSPITAL_COMMUNITY): Payer: Self-pay | Admitting: *Deleted

## 2017-04-23 ENCOUNTER — Inpatient Hospital Stay (HOSPITAL_COMMUNITY)
Admission: AD | Admit: 2017-04-23 | Discharge: 2017-04-23 | Disposition: A | Discharge status: Home / Self Care | Payer: Self-pay | Source: Ambulatory Visit | Attending: Obstetrics & Gynecology | Admitting: Obstetrics & Gynecology

## 2017-04-23 ENCOUNTER — Inpatient Hospital Stay (HOSPITAL_COMMUNITY): Payer: Self-pay

## 2017-04-23 DIAGNOSIS — O99331 Smoking (tobacco) complicating pregnancy, first trimester: Secondary | ICD-10-CM | POA: Insufficient documentation

## 2017-04-23 DIAGNOSIS — F329 Major depressive disorder, single episode, unspecified: Secondary | ICD-10-CM | POA: Insufficient documentation

## 2017-04-23 DIAGNOSIS — Z79899 Other long term (current) drug therapy: Secondary | ICD-10-CM | POA: Insufficient documentation

## 2017-04-23 DIAGNOSIS — Z91013 Allergy to seafood: Secondary | ICD-10-CM | POA: Insufficient documentation

## 2017-04-23 DIAGNOSIS — Z3491 Encounter for supervision of normal pregnancy, unspecified, first trimester: Secondary | ICD-10-CM

## 2017-04-23 DIAGNOSIS — O26891 Other specified pregnancy related conditions, first trimester: Secondary | ICD-10-CM | POA: Insufficient documentation

## 2017-04-23 DIAGNOSIS — R109 Unspecified abdominal pain: Secondary | ICD-10-CM | POA: Insufficient documentation

## 2017-04-23 DIAGNOSIS — Z3201 Encounter for pregnancy test, result positive: Secondary | ICD-10-CM | POA: Insufficient documentation

## 2017-04-23 DIAGNOSIS — F17210 Nicotine dependence, cigarettes, uncomplicated: Secondary | ICD-10-CM | POA: Insufficient documentation

## 2017-04-23 DIAGNOSIS — O2341 Unspecified infection of urinary tract in pregnancy, first trimester: Secondary | ICD-10-CM | POA: Insufficient documentation

## 2017-04-23 DIAGNOSIS — Z3A22 22 weeks gestation of pregnancy: Secondary | ICD-10-CM | POA: Insufficient documentation

## 2017-04-23 DIAGNOSIS — O99341 Other mental disorders complicating pregnancy, first trimester: Secondary | ICD-10-CM | POA: Insufficient documentation

## 2017-04-23 LAB — URINALYSIS, ROUTINE W REFLEX MICROSCOPIC
BILIRUBIN URINE: NEGATIVE
Glucose, UA: NEGATIVE mg/dL
HGB URINE DIPSTICK: NEGATIVE
Ketones, ur: NEGATIVE mg/dL
Nitrite: POSITIVE — AB
PROTEIN: NEGATIVE mg/dL
Specific Gravity, Urine: 1.019 (ref 1.005–1.030)
pH: 6 (ref 5.0–8.0)

## 2017-04-23 LAB — CBC
HCT: 37.3 % (ref 36.0–46.0)
HEMOGLOBIN: 12.8 g/dL (ref 12.0–15.0)
MCH: 32.9 pg (ref 26.0–34.0)
MCHC: 34.3 g/dL (ref 30.0–36.0)
MCV: 95.9 fL (ref 78.0–100.0)
Platelets: 288 10*3/uL (ref 150–400)
RBC: 3.89 MIL/uL (ref 3.87–5.11)
RDW: 13 % (ref 11.5–15.5)
WBC: 13 10*3/uL — AB (ref 4.0–10.5)

## 2017-04-23 LAB — POCT PREGNANCY, URINE: PREG TEST UR: POSITIVE — AB

## 2017-04-23 LAB — WET PREP, GENITAL
Sperm: NONE SEEN
TRICH WET PREP: NONE SEEN
YEAST WET PREP: NONE SEEN

## 2017-04-23 LAB — HCG, QUANTITATIVE, PREGNANCY: HCG, BETA CHAIN, QUANT, S: 85593 m[IU]/mL — AB (ref <5)

## 2017-04-23 MED ORDER — CEPHALEXIN 500 MG PO CAPS: 500.0000 mg | ORAL_CAPSULE | Freq: Four times a day (QID) | ORAL | 0 refills | Status: DC

## 2017-04-23 NOTE — Progress Notes (Addendum)
G4P3 @ ???. +pregn test. Presents to triage for UTI symptoms with lower abd pain.   Last period was in June  1330: provider at bs assessing. Pelvic, GC and wetprep done  To U/S Back from U/S  1610: D/c instructions given with pt understanding.Pt left unit via ambulatory

## 2017-04-23 NOTE — MAU Note (Signed)
Pt reports lower abd pain, dysuria, urinary urgency, urinary frequency

## 2017-04-23 NOTE — MAU Provider Note (Signed)
Chief Complaint: Abdominal Pain; Dysuria; and Urinary Frequency   None     SUBJECTIVE HPI: Casey Owens is a 26 y.o. [phone] at 22 weeks by LMP who presents to maternity admissions reporting positive pregnancy test 1 week ago, frequent urination, dysuria, and abdominal cramping.  She reports she had one period in June, after she had her baby in March, but no period since. This is not unusual for her as her periods are always irregular.  The frequent urination, dysuria, and mild lower abdominal intermittent cramping started 2 weeks ago.  She has not tried any treatments.  There are no other associated symptoms. She denies vaginal bleeding, vaginal itching/burning, h/a, dizziness, n/v, or fever/chills.     HPI  Past Medical History:  Diagnosis Date  . Asthma   . Depression   . 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 NO PAST SURGERIES     Social History   Socioeconomic History  . Marital status: Single    Spouse name: Not on file  . Number of children: Not on file  . Years of education: Not on file  . Highest education level: Not on file  Social Needs  . Financial resource strain: Not on file  . Food insecurity - worry: Not on file  . Food insecurity - inability: Not on file  . Transportation needs - medical: Not on file  . Transportation needs - non-medical: Not on file  Occupational History  . Not on file  Tobacco Use  . Smoking status: Current Every Day Smoker    Packs/day: 0.25    Years: 10.00    Pack years: 2.50    Types: Cigarettes  . Smokeless tobacco: Never Used  Substance and Sexual Activity  . Alcohol use: No  . Drug use: Yes    Types: Cocaine, Marijuana    Comment: says quit drug use in January  . Sexual activity: Yes    Birth control/protection: None, Inserts  Other Topics Concern  . Not on file  Social History Narrative  . Not on file   No  current facility-administered medications on file prior to encounter.    Current Outpatient Medications on File Prior to Encounter  Medication Sig Dispense Refill  . FLUoxetine (PROZAC) 40 MG capsule Take 40 mg by mouth daily.    Marland Kitchen. ibuprofen (ADVIL,MOTRIN) 600 MG tablet Take 1 tablet (600 mg total) by mouth every 6 (six) hours. (Patient not taking: Reported on 08/18/2016) 30 tablet 0   Allergies  Allergen Reactions  . Shellfish Allergy Swelling    ROS:  Review of Systems  Constitutional: Negative for chills, fatigue and fever.  Respiratory: Negative for shortness of breath.   Cardiovascular: Negative for chest pain.  Gastrointestinal: Positive for abdominal pain. Negative for constipation, nausea and vomiting.  Genitourinary: Positive for dysuria, frequency and pelvic pain. Negative for difficulty urinating, flank pain, vaginal bleeding, vaginal discharge and vaginal pain.  Musculoskeletal: Negative for back pain.  Neurological: Negative for dizziness and headaches.  Psychiatric/Behavioral: Negative.      I have reviewed patient's Past Medical Hx, Surgical Hx, Family Hx, Social Hx, medications and allergies.   Physical Exam   Patient Vitals for the past 24 hrs:  BP Temp Temp src Pulse Resp SpO2 Height Weight  04/23/17 1601 119/76 98.5 F (36.9 C) Oral 77 16 - - -  04/23/17 1211 132/73 98.1 F (36.7 C) Oral 68  16 100 % 5\' 4"  (1.626 m) 120 lb (54.4 kg)   Constitutional: Well-developed, well-nourished female in no acute distress.  Cardiovascular: normal rate Respiratory: normal effort GI: Abd soft, non-tender. Pos BS x 4 MS: Extremities nontender, no edema, normal ROM Neurologic: Alert and oriented x 4.  GU: Neg CVAT.  PELVIC EXAM: Cervix pink, visually closed, without lesion, scant white creamy discharge, vaginal walls and external genitalia normal Bimanual exam: Cervix 0/long/high, firm, anterior, neg CMT, uterus nontender, nonenlarged, adnexa without tenderness,  enlargement, or mass   LAB RESULTS Results for orders placed or performed during the hospital encounter of 04/23/17 (from the past 24 hour(s))  Urinalysis, Routine w reflex microscopic     Status: Abnormal   Collection Time: 04/23/17 12:06 PM  Result Value Ref Range   Color, Urine YELLOW YELLOW   APPearance CLOUDY (A) CLEAR   Specific Gravity, Urine 1.019 1.005 - 1.030   pH 6.0 5.0 - 8.0   Glucose, UA NEGATIVE NEGATIVE mg/dL   Hgb urine dipstick NEGATIVE NEGATIVE   Bilirubin Urine NEGATIVE NEGATIVE   Ketones, ur NEGATIVE NEGATIVE mg/dL   Protein, ur NEGATIVE NEGATIVE mg/dL   Nitrite POSITIVE (A) NEGATIVE   Leukocytes, UA LARGE (A) NEGATIVE   RBC / HPF 6-30 0 - 5 RBC/hpf   WBC, UA TOO NUMEROUS TO COUNT 0 - 5 WBC/hpf   Bacteria, UA MANY (A) NONE SEEN   Squamous Epithelial / LPF TOO NUMEROUS TO COUNT (A) NONE SEEN   WBC Clumps PRESENT    Mucus PRESENT   Pregnancy, urine POC     Status: Abnormal   Collection Time: 04/23/17 12:16 PM  Result Value Ref Range   Preg Test, Ur POSITIVE (A) NEGATIVE  Wet prep, genital     Status: Abnormal   Collection Time: 04/23/17  1:16 PM  Result Value Ref Range   Yeast Wet Prep HPF POC NONE SEEN NONE SEEN   Trich, Wet Prep NONE SEEN NONE SEEN   Clue Cells Wet Prep HPF POC PRESENT (A) NONE SEEN   WBC, Wet Prep HPF POC MANY (A) NONE SEEN   Sperm NONE SEEN   CBC     Status: Abnormal   Collection Time: 04/23/17  1:39 PM  Result Value Ref Range   WBC 13.0 (H) 4.0 - 10.5 K/uL   RBC 3.89 3.87 - 5.11 MIL/uL   Hemoglobin 12.8 12.0 - 15.0 g/dL   HCT 28.4 13.2 - 44.0 %   MCV 95.9 78.0 - 100.0 fL   MCH 32.9 26.0 - 34.0 pg   MCHC 34.3 30.0 - 36.0 g/dL   RDW 10.2 72.5 - 36.6 %   Platelets 288 150 - 400 K/uL  hCG, quantitative, pregnancy     Status: Abnormal   Collection Time: 04/23/17  1:39 PM  Result Value Ref Range   hCG, Beta Chain, Quant, S 85,593 (H) <5 mIU/mL    --/--/O POS (03/21 0947)  IMAGING US Ob Comp Less 14 Wks  Result Date:  04/23/2017 CLINICAL DATA:  Abdominal pain EXAM: OBSTETRIC <14 WK [place] AND TRANSVAGINAL OB US TECHNIQUE: Both transabdominal and transvaginal ultrasound examinations were performed for complete evaluation of the gestation as well as the maternal uterus, adnexal regions, and pelvic cul-de-sac. Transvaginal technique was performed to assess early pregnancy. COMPARISON:  None. FINDINGS: Intrauterine gestational sac: Single Yolk sac:  No Embryo:  Yes Cardiac Activity: Yes Heart Rate: 164  bpm CRL:  43.7  mm   11 w   1 d  US EDC: 11/11/2017 Maternal uterus/adnexae: Subchorionic hemorrhage: None visualized Right ovary: Normal Left ovary: Norm Other :None Free fluid:  None IMPRESSION: 1. Single living intrauterine gestation with an estimated gestational age of [redacted] weeks and 1 day. 2. No complicating features identified. Electronically Signed   By: Signa Kellaylor  Stroud M.D.   On: 04/23/2017 15:08    MAU Management/MDM: Ordered labs and US and reviewed results. US with normal IUP at 6372w1d. UTI on UA today, will treat with Keflex 500 mg QID.  Pt to f/u with prenatal care.  Pt discharged with strict precautions.  ASSESSMENT 1. Normal IUP (intrauterine pregnancy) on prenatal ultrasound, first trimester   2. Abdominal pain during pregnancy in first trimester   3. UTI (urinary tract infection) during pregnancy, first trimester     PLAN Discharge home Allergies as of 04/23/2017      Reactions   Shellfish Allergy Swelling      Medication List    STOP taking these medications   benzocaine-Menthol 20-0.5 % Aero Commonly known as:  DERMOPLAST   ondansetron 4 MG disintegrating tablet Commonly known as:  ZOFRAN ODT     TAKE these medications   cephALEXin 500 MG capsule Commonly known as:  KEFLEX Take 1 capsule (500 mg total) by mouth 4 (four) times daily.   FLUoxetine 40 MG capsule Commonly known as:  PROZAC Take 40 mg by mouth daily.   ibuprofen 600 MG tablet Commonly known as:   ADVIL,MOTRIN Take 1 tablet (600 mg total) by mouth every 6 (six) hours.      Follow-up Information    [facility] Follow up.   Specialty:  Obstetrics and Gynecology Why:  The office will call you to make an appointment.  Return to MAU as needed for emergencies. Contact information: [address] [place] [phone]          Sharen CounterLisa Leftwich-Kirby Certified Nurse-Midwife 04/23/2017  7:02 PM

## 2017-04-24 LAB — GC/CHLAMYDIA PROBE AMP (~~LOC~~) NOT AT ARMC
CHLAMYDIA, DNA PROBE: NEGATIVE
Neisseria Gonorrhea: NEGATIVE

## 2017-04-24 LAB — HIV ANTIBODY (ROUTINE TESTING W REFLEX): HIV Screen 4th Generation wRfx: NONREACTIVE

## 2017-04-28 DIAGNOSIS — O0990 Supervision of high risk pregnancy, unspecified, unspecified trimester: Secondary | ICD-10-CM | POA: Insufficient documentation

## 2017-04-29 ENCOUNTER — Ambulatory Visit (INDEPENDENT_AMBULATORY_CARE_PROVIDER_SITE_OTHER): Payer: Self-pay | Admitting: Obstetrics & Gynecology

## 2017-04-29 ENCOUNTER — Encounter: Payer: Self-pay | Admitting: Obstetrics & Gynecology

## 2017-04-29 VITALS — BP 121/75 | HR 62 | Wt 120.0 lb

## 2017-04-29 DIAGNOSIS — Z23 Encounter for immunization: Secondary | ICD-10-CM

## 2017-04-29 DIAGNOSIS — O99331 Smoking (tobacco) complicating pregnancy, first trimester: Secondary | ICD-10-CM

## 2017-04-29 DIAGNOSIS — Z3480 Encounter for supervision of other normal pregnancy, unspecified trimester: Secondary | ICD-10-CM

## 2017-04-29 DIAGNOSIS — Z113 Encounter for screening for infections with a predominantly sexual mode of transmission: Secondary | ICD-10-CM

## 2017-04-29 DIAGNOSIS — F1991 Other psychoactive substance use, unspecified, in remission: Secondary | ICD-10-CM

## 2017-04-29 DIAGNOSIS — Z87898 Personal history of other specified conditions: Secondary | ICD-10-CM | POA: Insufficient documentation

## 2017-04-29 DIAGNOSIS — O0991 Supervision of high risk pregnancy, unspecified, first trimester: Secondary | ICD-10-CM

## 2017-04-29 DIAGNOSIS — Z124 Encounter for screening for malignant neoplasm of cervix: Secondary | ICD-10-CM

## 2017-04-29 DIAGNOSIS — Z3481 Encounter for supervision of other normal pregnancy, first trimester: Secondary | ICD-10-CM

## 2017-04-29 DIAGNOSIS — O09219 Supervision of pregnancy with history of pre-term labor, unspecified trimester: Secondary | ICD-10-CM

## 2017-04-29 DIAGNOSIS — O09211 Supervision of pregnancy with history of pre-term labor, first trimester: Secondary | ICD-10-CM

## 2017-04-29 NOTE — Progress Notes (Signed)
Pt presents for New OB Visit today. Pt declines any genetic screening.

## 2017-04-29 NOTE — Progress Notes (Signed)
Subjective:   Casey Owens is a 26 y.o. [phone] at 10950w0d by early ultrasound done today being seen today for her first obstetrical visit.  Her obstetrical history is significant for preterm deliveries, declines 17P.  Pregnancy history fully reviewed.  Patient reports no complaints.  HISTORY: Obstetric History   G5   P4   T1   P3   A0   L4    SAB0   TAB0   Ectopic0   Multiple0   Live Births4     # Outcome Date GA Lbr Len/2nd Weight Sex Delivery Anes PTL Lv  5 Current           4 Preterm 08/16/16 8833w0d 814:40 / 00:15 4 lb 2 oz (1.87 kg) F Vag-Spont EPI  LIV     Name: Buchinger,GIRL DMTRIEVE     Apgar1:  8                Apgar5: 9  3 Preterm 03/24/15 1067w4d 04:46 / 00:02 4 lb 5 oz (1.956 kg) F Vag-Spont   LIV     Name: Linhart,GIRL DMTRIEVE     Apgar1:  9                Apgar5: 9  2 Preterm 06/20/11 5133w0d  4 lb 6 oz (1.984 kg) M Vag-Spont None Y LIV     Complications: Preterm labor in second trimester with preterm delivery  1 Term 05/08/08 5373w0d  5 lb 4 oz (2.381 kg) F  None Y LIV     Past Medical History:  Diagnosis Date  . Asthma   . Depression   . 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 NO PAST SURGERIES     Family History  Problem Relation Age of Onset  . Hypertension Mother   . Arthritis Paternal Aunt   . Hypertension Maternal Grandmother   . Arthritis Paternal Grandmother    Social History   Tobacco Use  . Smoking status: Current Every Day Smoker    Packs/day: 0.25    Years: 10.00    Pack years: 2.50    Types: Cigarettes  . Smokeless tobacco: Never Used  Substance Use Topics  . Alcohol use: No  . Drug use: Yes    Types: Cocaine, Marijuana    Comment: says quit drug use in January   Allergies  Allergen Reactions  . Shellfish Allergy Swelling   Current Outpatient Medications on File Prior to Visit  Medication Sig Dispense Refill  . Prenatal  Vit-Fe Fumarate-FA (PRENATAL MULTIVITAMIN) TABS tablet Take 1 tablet by mouth daily at 12 noon.    . cephALEXin (KEFLEX) 500 MG capsule Take 1 capsule (500 mg total) by mouth 4 (four) times daily. (Patient not taking: Reported on 04/29/2017) 28 capsule 0  . FLUoxetine (PROZAC) 40 MG capsule Take 40 mg by mouth daily.    Marland Kitchen. ibuprofen (ADVIL,MOTRIN) 600 MG tablet Take 1 tablet (600 mg total) by mouth every 6 (six) hours. (Patient not taking: Reported on 08/18/2016) 30 tablet 0   No current facility-administered medications on file prior to visit.      Exam   Vitals:   04/29/17 1432  BP: 121/75  Pulse: 62  Weight: 120 lb (54.4 kg)      Uterus:     Pelvic Exam: Perineum: no hemorrhoids, normal perineum   Vulva: normal external genitalia, no lesions  Vagina:  normal mucosa, normal discharge   Cervix: no lesions and normal, pap smear done.  Very friable cervix during pap, significant bleeding encountered that filled 3 scopettes.  Used silver nitrate to stop bleeding.    Bony Pelvis: average  System: General: well-developed, well-nourished female in no acute distress   Breast:  normal appearance, no masses or tenderness   Skin: normal coloration and turgor, no rashes   Neurologic: oriented, normal, negative, normal mood   Extremities: normal strength, tone, and muscle mass, ROM of all joints is normal   HEENT PERRLA, extraocular movement intact and sclera clear, anicteric   Mouth/Teeth mucous membranes moist, pharynx normal without lesions and dental hygiene good   Neck supple and no masses   Cardiovascular: regular rate and rhythm   Respiratory:  no respiratory distress, normal breath sounds   Abdomen: soft, non-tender; bowel sounds normal; no masses,  no organomegaly     Assessment:   Pregnancy: [id number] Patient Active Problem List   Diagnosis Date Noted  . History of drug use 04/29/2017  . Previous preterm delivery, antepartum 04/29/2017  . Supervision of high-risk pregnancy  04/28/2017  . Tobacco use in pregnancy 02/10/2015     Plan:  1. Maternal tobacco use in first trimester Smoking 2 cigarettes/week; encouraged to quit. De  2. Immunization due - Flu Vaccine QUAD 36+ mos IM  3. History of drug use - ToxASSURE Select 13 (MW), Urine done today with patient's verbal consent.  Declines all current drug use.  4. Previous preterm delivery, antepartum Declines 17P  5.  Supervision of high risk pregnancy in first trimester - Obstetric Panel, Including HIV - Hemoglobinopathy evaluation - Cystic Fibrosis Mutation 97 - SMN1 COPY NUMBER ANALYSIS (SMA Carrier Screen) - Culture, OB Urine - Cytology - PAP - Enroll Patient in Babyscripts Initial labs drawn. Continue prenatal vitamins. Genetic Screening discussed, First trimester screen, Quad screen and NIPS: declined. Ultrasound discussed; fetal anatomic survey:to be ordered next visit. Problem list reviewed and updated. The nature of [facility] with multiple MDs and other Advanced Practice Providers was explained to patient; also emphasized that residents, students are part of our team. Routine obstetric precautions reviewed. Return in about 4 weeks (around 05/27/2017) for OB Visit.     Jaynie CollinsUGONNA  Eulah Walkup, MD, FACOG Attending Obstetrician & Gynecologist, [facility], [facility]

## 2017-04-29 NOTE — Progress Notes (Signed)
DATING AND VIABILITY SONOGRAM   Casey Owens is a 26 y.o. year old [phone] with LMP No LMP recorded. Patient is pregnant. which would correlate to  2053w0d weeks gestation.  She has irregular menstrual cycles.   She is here today for a confirmatory initial sonogram.    GESTATION: SINGLETON     FETAL ACTIVITY:          Heart rate: 168bpm           The fetus is active.  GESTATIONAL AGE AND  BIOMETRICS:  Gestational criteria: Estimated Date of Delivery: 11/11/17 by early ultrasound now at 2053w0d    FL 0.723cm         12.1 weeks  CROWN RUMP LENGTH 5.38cm         12.0 weeks                                                                               AVERAGE EGA(BY THIS SCAN):  12 weeks  WORKING EDD( early ultrasound ):  11/11/17     TECHNICIAN COMMENTS:  SLIUP measuring 12.0 wks AUA by CRL/FL with FHR 168bpm   A copy of this report including all images has been saved and backed up to a second source for retrieval if needed. All measures and details of the anatomical scan, placentation, fluid volume and pelvic anatomy are contained in that report.  Casey Owens 04/29/2017 2:51 PM

## 2017-04-29 NOTE — Addendum Note (Signed)
Addended by: Arne ClevelandHUTCHINSON, MANDY J on: 04/29/2017 06:04 PM   Modules accepted: Orders

## 2017-04-29 NOTE — Patient Instructions (Signed)
First Trimester of Pregnancy The first trimester of pregnancy is from week 1 until the end of week 13 (months 1 through 3). A week after a sperm fertilizes an egg, the egg will implant on the wall of the uterus. This embryo will begin to develop into a baby. Genes from you and your partner will form the baby. The female genes will determine whether the baby will be a boy or a girl. At 6-8 weeks, the eyes and face will be formed, and the heartbeat can be seen on ultrasound. At the end of 12 weeks, all the baby's organs will be formed. Now that you are pregnant, you will want to do everything you can to have a healthy baby. Two of the most important things are to get good prenatal care and to follow your health care provider's instructions. Prenatal care is all the medical care you receive before the baby's birth. This care will help prevent, find, and treat any problems during the pregnancy and childbirth. Body changes during your first trimester Your body goes through many changes during pregnancy. The changes vary from woman to woman.  You may gain or lose a couple of pounds at first.  You may feel sick to your stomach (nauseous) and you may throw up (vomit). If the vomiting is uncontrollable, call your health care provider.  You may tire easily.  You may develop headaches that can be relieved by medicines. All medicines should be approved by your health care provider.  You may urinate more often. Painful urination may mean you have a bladder infection.  You may develop heartburn as a result of your pregnancy.  You may develop constipation because certain hormones are causing the muscles that push stool through your intestines to slow down.  You may develop hemorrhoids or swollen veins (varicose veins).  Your breasts may begin to grow larger and become tender. Your nipples may stick out more, and the tissue that surrounds them (areola) may become darker.  Your gums may bleed and may be  sensitive to brushing and flossing.  Dark spots or blotches (chloasma, mask of pregnancy) may develop on your face. This will likely fade after the baby is born.  Your menstrual periods will stop.  You may have a loss of appetite.  You may develop cravings for certain kinds of food.  You may have changes in your emotions from day to day, such as being excited to be pregnant or being concerned that something may go wrong with the pregnancy and baby.  You may have more vivid and strange dreams.  You may have changes in your hair. These can include thickening of your hair, rapid growth, and changes in texture. Some women also have hair loss during or after pregnancy, or hair that feels dry or thin. Your hair will most likely return to normal after your baby is born.  What to expect at prenatal visits During a routine prenatal visit:  You will be weighed to make sure you and the baby are growing normally.  Your blood pressure will be taken.  Your abdomen will be measured to track your baby's growth.  The fetal heartbeat will be listened to between weeks 10 and 14 of your pregnancy.  Test results from any previous visits will be discussed.  Your health care provider may ask you:  How you are feeling.  If you are feeling the baby move.  If you have had any abnormal symptoms, such as leaking fluid, bleeding, severe headaches,   or abdominal cramping.  If you are using any tobacco products, including cigarettes, chewing tobacco, and electronic cigarettes.  If you have any questions.  Other tests that may be performed during your first trimester include:  Blood tests to find your blood type and to check for the presence of any previous infections. The tests will also be used to check for low iron levels (anemia) and protein on red blood cells (Rh antibodies). Depending on your risk factors, or if you previously had diabetes during pregnancy, you may have tests to check for high blood  sugar that affects pregnant women (gestational diabetes).  Urine tests to check for infections, diabetes, or protein in the urine.  An ultrasound to confirm the proper growth and development of the baby.  Fetal screens for spinal cord problems (spina bifida) and Down syndrome.  HIV (human immunodeficiency virus) testing. Routine prenatal testing includes screening for HIV, unless you choose not to have this test.  You may need other tests to make sure you and the baby are doing well.  Follow these instructions at home: Medicines  Follow your health care provider's instructions regarding medicine use. Specific medicines may be either safe or unsafe to take during pregnancy.  Take a prenatal vitamin that contains at least 600 micrograms (mcg) of folic acid.  If you develop constipation, try taking a stool softener if your health care provider approves. Eating and drinking  Eat a balanced diet that includes fresh fruits and vegetables, whole grains, good sources of protein such as meat, eggs, or tofu, and low-fat dairy. Your health care provider will help you determine the amount of weight gain that is right for you.  Avoid raw meat and uncooked cheese. These carry germs that can cause birth defects in the baby.  Eating four or five small meals rather than three large meals a day may help relieve nausea and vomiting. If you start to feel nauseous, eating a few soda crackers can be helpful. Drinking liquids between meals, instead of during meals, also seems to help ease nausea and vomiting.  Limit foods that are high in fat and processed sugars, such as fried and sweet foods.  To prevent constipation: ? Eat foods that are high in fiber, such as fresh fruits and vegetables, whole grains, and beans. ? Drink enough fluid to keep your urine clear or pale yellow. Activity  Exercise only as directed by your health care provider. Most women can continue their usual exercise routine during  pregnancy. Try to exercise for 30 minutes at least 5 days a week. Exercising will help you: ? Control your weight. ? Stay in shape. ? Be prepared for labor and delivery.  Experiencing pain or cramping in the lower abdomen or lower back is a good sign that you should stop exercising. Check with your health care provider before continuing with normal exercises.  Try to avoid standing for long periods of time. Move your legs often if you must stand in one place for a long time.  Avoid heavy lifting.  Wear low-heeled shoes and practice good posture.  You may continue to have sex unless your health care provider tells you not to. Relieving pain and discomfort  Wear a good support bra to relieve breast tenderness.  Take warm sitz baths to soothe any pain or discomfort caused by hemorrhoids. Use hemorrhoid cream if your health care provider approves.  Rest with your legs elevated if you have leg cramps or low back pain.  If you develop   varicose veins in your legs, wear support hose. Elevate your feet for 15 minutes, 3-4 times a day. Limit salt in your diet. Prenatal care  Schedule your prenatal visits by the twelfth week of pregnancy. They are usually scheduled monthly at first, then more often in the last 2 months before delivery.  Write down your questions. Take them to your prenatal visits.  Keep all your prenatal visits as told by your health care provider. This is important. Safety  Wear your seat belt at all times when driving.  Make a list of emergency phone numbers, including numbers for family, friends, the hospital, and police and fire departments. General instructions  Ask your health care provider for a referral to a local prenatal education class. Begin classes no later than the beginning of month 6 of your pregnancy.  Ask for help if you have counseling or nutritional needs during pregnancy. Your health care provider can offer advice or refer you to specialists for help  with various needs.  Do not use hot tubs, steam rooms, or saunas.  Do not douche or use tampons or scented sanitary pads.  Do not cross your legs for long periods of time.  Avoid cat litter boxes and soil used by cats. These carry germs that can cause birth defects in the baby and possibly loss of the fetus by miscarriage or stillbirth.  Avoid all smoking, herbs, alcohol, and medicines not prescribed by your health care provider. Chemicals in these products affect the formation and growth of the baby.  Do not use any products that contain nicotine or tobacco, such as cigarettes and e-cigarettes. If you need help quitting, ask your health care provider. You may receive counseling support and other resources to help you quit.  Schedule a dentist appointment. At home, brush your teeth with a soft toothbrush and be gentle when you floss. Contact a health care provider if:  You have dizziness.  You have mild pelvic cramps, pelvic pressure, or nagging pain in the abdominal area.  You have persistent nausea, vomiting, or diarrhea.  You have a bad smelling vaginal discharge.  You have pain when you urinate.  You notice increased swelling in your face, hands, legs, or ankles.  You are exposed to fifth disease or chickenpox.  You are exposed to German measles (rubella) and have never had it. Get help right away if:  You have a fever.  You are leaking fluid from your vagina.  You have spotting or bleeding from your vagina.  You have severe abdominal cramping or pain.  You have rapid weight gain or loss.  You vomit blood or material that looks like coffee grounds.  You develop a severe headache.  You have shortness of breath.  You have any kind of trauma, such as from a fall or a car accident. Summary  The first trimester of pregnancy is from week 1 until the end of week 13 (months 1 through 3).  Your body goes through many changes during pregnancy. The changes vary from  woman to woman.  You will have routine prenatal visits. During those visits, your health care provider will examine you, discuss any test results you may have, and talk with you about how you are feeling. This information is not intended to replace advice given to you by your health care provider. Make sure you discuss any questions you have with your health care provider. Document Released: 05/07/2001 Document Revised: 04/24/2016 Document Reviewed: 04/24/2016 [employer] Interactive Patient Education  2017 [employer]vier   [employer]   Second Trimester of Pregnancy The second trimester is from week 14 through week 27 (months 4 through 6). The second trimester is often a time when you feel your best. Your body has adjusted to being pregnant, and you begin to feel better physically. Usually, morning sickness has lessened or quit completely, you may have more energy, and you may have an increase in appetite. The second trimester is also a time when the fetus is growing rapidly. At the end of the sixth month, the fetus is about 9 inches long and weighs about 1 pounds. You will likely begin to feel the baby move (quickening) between 16 and 20 weeks of pregnancy. Body changes during your second trimester Your body continues to go through many changes during your second trimester. The changes vary from woman to woman.  Your weight will continue to increase. You will notice your lower abdomen bulging out.  You may begin to get stretch marks on your hips, abdomen, and breasts.  You may develop headaches that can be relieved by medicines. The medicines should be approved by your health care provider.  You may urinate more often because the fetus is pressing on your bladder.  You may develop or continue to have heartburn as a result of your pregnancy.  You may develop constipation because certain hormones are causing the muscles that push waste through your intestines to slow down.  You may develop hemorrhoids or  swollen, bulging veins (varicose veins).  You may have back pain. This is caused by: ? Weight gain. ? Pregnancy hormones that are relaxing the joints in your pelvis. ? A shift in weight and the muscles that support your balance.  Your breasts will continue to grow and they will continue to become tender.  Your gums may bleed and may be sensitive to brushing and flossing.  Dark spots or blotches (chloasma, mask of pregnancy) may develop on your face. This will likely fade after the baby is born.  A dark line from your belly button to the pubic area (linea nigra) may appear. This will likely fade after the baby is born.  You may have changes in your hair. These can include thickening of your hair, rapid growth, and changes in texture. Some women also have hair loss during or after pregnancy, or hair that feels dry or thin. Your hair will most likely return to normal after your baby is born.  What to expect at prenatal visits During a routine prenatal visit:  You will be weighed to make sure you and the fetus are growing normally.  Your blood pressure will be taken.  Your abdomen will be measured to track your baby's growth.  The fetal heartbeat will be listened to.  Any test results from the previous visit will be discussed.  Your health care provider may ask you:  How you are feeling.  If you are feeling the baby move.  If you have had any abnormal symptoms, such as leaking fluid, bleeding, severe headaches, or abdominal cramping.  If you are using any tobacco products, including cigarettes, chewing tobacco, and electronic cigarettes.  If you have any questions.  Other tests that may be performed during your second trimester include:  Blood tests that check for: ? Low iron levels (anemia). ? High blood sugar that affects pregnant women (gestational diabetes) between 24 and 28 weeks. ? Rh antibodies. This is to check for a protein on red blood cells (Rh factor).  Urine  tests to   check for infections, diabetes, or protein in the urine.  An ultrasound to confirm the proper growth and development of the baby.  An amniocentesis to check for possible genetic problems.  Fetal screens for spina bifida and Down syndrome.  HIV (human immunodeficiency virus) testing. Routine prenatal testing includes screening for HIV, unless you choose not to have this test.  Follow these instructions at home: Medicines  Follow your health care provider's instructions regarding medicine use. Specific medicines may be either safe or unsafe to take during pregnancy.  Take a prenatal vitamin that contains at least 600 micrograms (mcg) of folic acid.  If you develop constipation, try taking a stool softener if your health care provider approves. Eating and drinking  Eat a balanced diet that includes fresh fruits and vegetables, whole grains, good sources of protein such as meat, eggs, or tofu, and low-fat dairy. Your health care provider will help you determine the amount of weight gain that is right for you.  Avoid raw meat and uncooked cheese. These carry germs that can cause birth defects in the baby.  If you have low calcium intake from food, talk to your health care provider about whether you should take a daily calcium supplement.  Limit foods that are high in fat and processed sugars, such as fried and sweet foods.  To prevent constipation: ? Drink enough fluid to keep your urine clear or pale yellow. ? Eat foods that are high in fiber, such as fresh fruits and vegetables, whole grains, and beans. Activity  Exercise only as directed by your health care provider. Most women can continue their usual exercise routine during pregnancy. Try to exercise for 30 minutes at least 5 days a week. Stop exercising if you experience uterine contractions.  Avoid heavy lifting, wear low heel shoes, and practice good posture.  A sexual relationship may be continued unless your health  care provider directs you otherwise. Relieving pain and discomfort  Wear a good support bra to prevent discomfort from breast tenderness.  Take warm sitz baths to soothe any pain or discomfort caused by hemorrhoids. Use hemorrhoid cream if your health care provider approves.  Rest with your legs elevated if you have leg cramps or low back pain.  If you develop varicose veins, wear support hose. Elevate your feet for 15 minutes, 3-4 times a day. Limit salt in your diet. Prenatal Care  Write down your questions. Take them to your prenatal visits.  Keep all your prenatal visits as told by your health care provider. This is important. Safety  Wear your seat belt at all times when driving.  Make a list of emergency phone numbers, including numbers for family, friends, the hospital, and police and fire departments. General instructions  Ask your health care provider for a referral to a local prenatal education class. Begin classes no later than the beginning of month 6 of your pregnancy.  Ask for help if you have counseling or nutritional needs during pregnancy. Your health care provider can offer advice or refer you to specialists for help with various needs.  Do not use hot tubs, steam rooms, or saunas.  Do not douche or use tampons or scented sanitary pads.  Do not cross your legs for long periods of time.  Avoid cat litter boxes and soil used by cats. These carry germs that can cause birth defects in the baby and possibly loss of the fetus by miscarriage or stillbirth.  Avoid all smoking, herbs, alcohol, and unprescribed drugs. Chemicals   in these products can affect the formation and growth of the baby.  Do not use any products that contain nicotine or tobacco, such as cigarettes and e-cigarettes. If you need help quitting, ask your health care provider.  Visit your dentist if you have not gone yet during your pregnancy. Use a soft toothbrush to brush your teeth and be gentle when  you floss. Contact a health care provider if:  You have dizziness.  You have mild pelvic cramps, pelvic pressure, or nagging pain in the abdominal area.  You have persistent nausea, vomiting, or diarrhea.  You have a bad smelling vaginal discharge.  You have pain when you urinate. Get help right away if:  You have a fever.  You are leaking fluid from your vagina.  You have spotting or bleeding from your vagina.  You have severe abdominal cramping or pain.  You have rapid weight gain or weight loss.  You have shortness of breath with chest pain.  You notice sudden or extreme swelling of your face, hands, ankles, feet, or legs.  You have not felt your baby move in over an hour.  You have severe headaches that do not go away when you take medicine.  You have vision changes. Summary  The second trimester is from week 14 through week 27 (months 4 through 6). It is also a time when the fetus is growing rapidly.  Your body goes through many changes during pregnancy. The changes vary from woman to woman.  Avoid all smoking, herbs, alcohol, and unprescribed drugs. These chemicals affect the formation and growth your baby.  Do not use any tobacco products, such as cigarettes, chewing tobacco, and e-cigarettes. If you need help quitting, ask your health care provider.  Contact your health care provider if you have any questions. Keep all prenatal visits as told by your health care provider. This is important. This information is not intended to replace advice given to you by your health care provider. Make sure you discuss any questions you have with your health care provider. Document Released: 05/07/2001 Document Revised: 10/19/2015 Document Reviewed: 07/14/2012 Elsevier Interactive Patient Education  2017 [employer]  

## 2017-04-30 LAB — CYTOLOGY - PAP
CHLAMYDIA, DNA PROBE: NEGATIVE
DIAGNOSIS: NEGATIVE
NEISSERIA GONORRHEA: NEGATIVE
TRICH (WINDOWPATH): NEGATIVE

## 2017-05-01 LAB — OBSTETRIC PANEL, INCLUDING HIV
Antibody Screen: NEGATIVE
Basophils Absolute: 0 10*3/uL (ref 0.0–0.2)
Basos: 0 %
EOS (ABSOLUTE): 0.1 10*3/uL (ref 0.0–0.4)
EOS: 1 %
HEMATOCRIT: 37.7 % (ref 34.0–46.6)
HEMOGLOBIN: 12.8 g/dL (ref 11.1–15.9)
HEP B S AG: NEGATIVE
HIV Screen 4th Generation wRfx: NONREACTIVE
IMMATURE GRANULOCYTES: 0 %
Immature Grans (Abs): 0 10*3/uL (ref 0.0–0.1)
LYMPHS: 20 %
Lymphocytes Absolute: 2.3 10*3/uL (ref 0.7–3.1)
MCH: 32.8 pg (ref 26.6–33.0)
MCHC: 34 g/dL (ref 31.5–35.7)
MCV: 97 fL (ref 79–97)
Monocytes Absolute: 0.8 10*3/uL (ref 0.1–0.9)
Monocytes: 7 %
NEUTROS PCT: 72 %
Neutrophils Absolute: 8.4 10*3/uL — ABNORMAL HIGH (ref 1.4–7.0)
Platelets: 286 10*3/uL (ref 150–379)
RBC: 3.9 x10E6/uL (ref 3.77–5.28)
RDW: 13.4 % (ref 12.3–15.4)
RPR: NONREACTIVE
RUBELLA: 1.76 {index} (ref >0.99)
Rh Factor: POSITIVE
WBC: 11.7 10*3/uL — AB (ref 3.4–10.8)

## 2017-05-01 LAB — HEMOGLOBINOPATHY EVALUATION
HGB A: 97.5 % (ref 96.4–98.8)
HGB C: 0 %
HGB S: 0 %
HGB VARIANT: 0 %
Hemoglobin A2 Quantitation: 2.5 % (ref 1.8–3.2)
Hemoglobin F Quantitation: 0 % (ref 0.0–2.0)

## 2017-05-02 ENCOUNTER — Other Ambulatory Visit: Payer: Self-pay | Admitting: Obstetrics & Gynecology

## 2017-05-02 DIAGNOSIS — O2341 Unspecified infection of urinary tract in pregnancy, first trimester: Secondary | ICD-10-CM

## 2017-05-02 LAB — URINE CULTURE, OB REFLEX

## 2017-05-02 LAB — CULTURE, OB URINE

## 2017-05-02 MED ORDER — CEPHALEXIN 500 MG PO CAPS: 500.0000 mg | ORAL_CAPSULE | Freq: Four times a day (QID) | ORAL | 0 refills | Status: DC

## 2017-05-03 LAB — TOXASSURE SELECT 13 (MW), URINE

## 2017-05-05 LAB — CYSTIC FIBROSIS MUTATION 97: GENE DIS ANAL CARRIER INTERP BLD/T-IMP: NOT DETECTED

## 2017-05-07 LAB — SMN1 COPY NUMBER ANALYSIS (SMA CARRIER SCREENING)

## 2017-05-22 IMAGING — US US OB FOLLOW-UP
1 series · 13 of 28 positions shown · non-contrast
Comparison: none

CLINICAL DATA: Current assigned gestational age of 34 weeks 0 days.
Measuring small for dates.

EXAM:
OBSTETRIC 14+ WK ULTRASOUND FOLLOW-UP

[Series 1: us ob follow-up · 0.27mm/px · 13 of 66 slices shown]
[im 3/66]
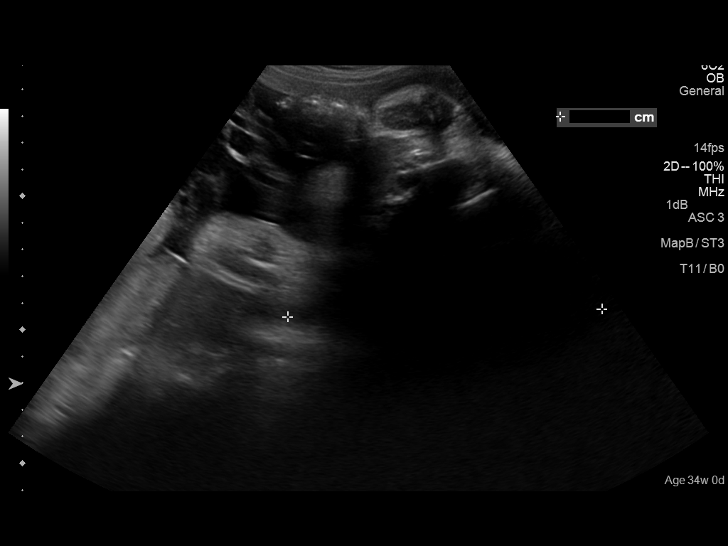
[im 8/66]
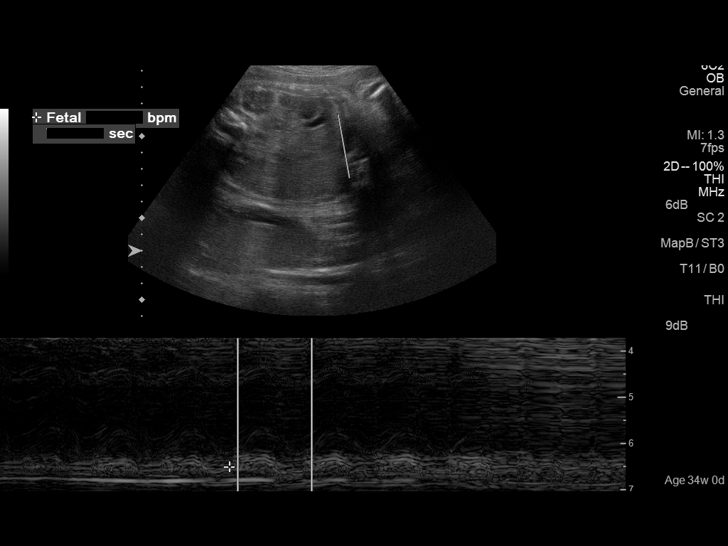
[im 13/66]
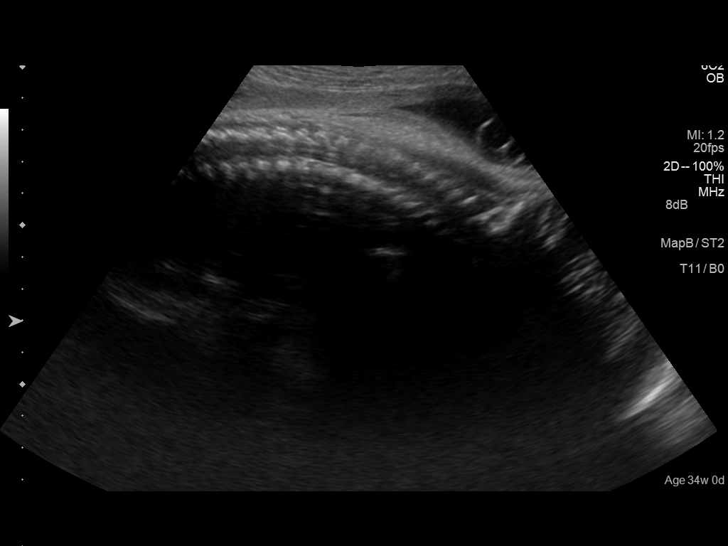
[im 17/66]
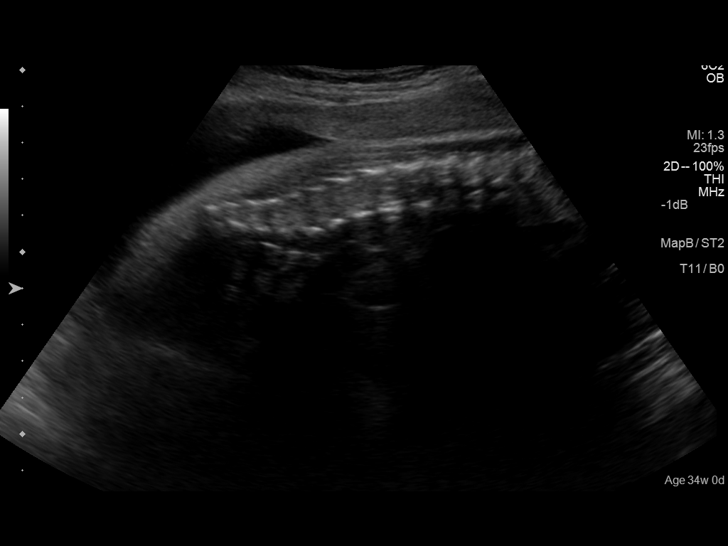
[im 22/66]
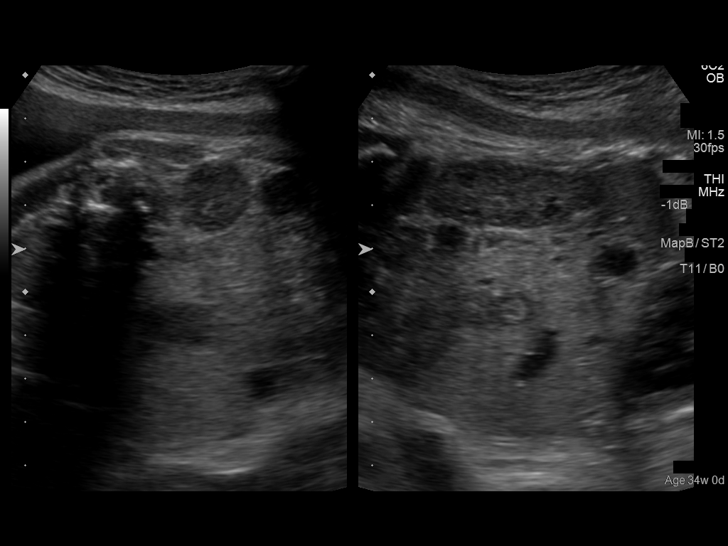
[im 27/66]
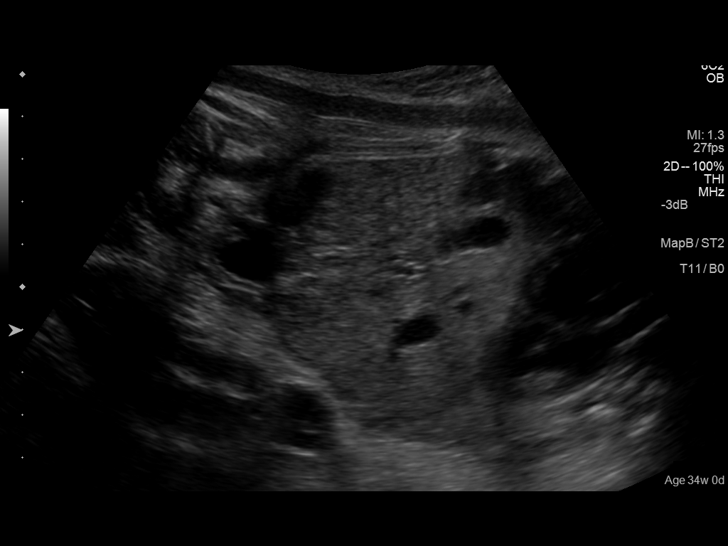
[im 34/66]
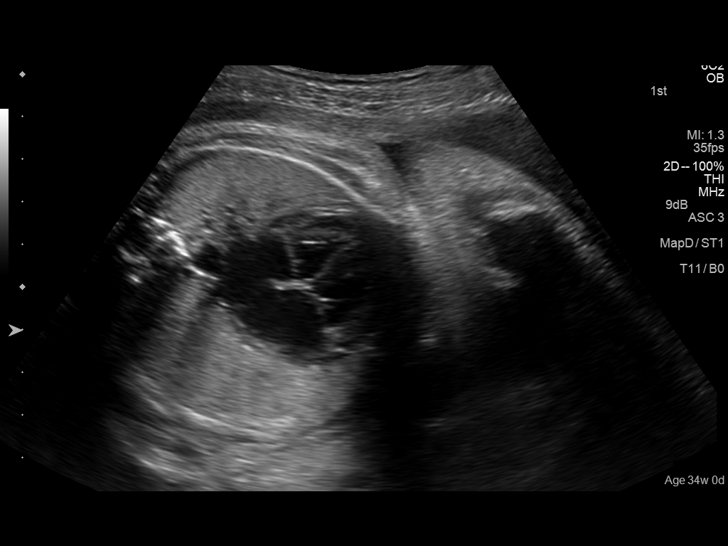
[im 39/66]
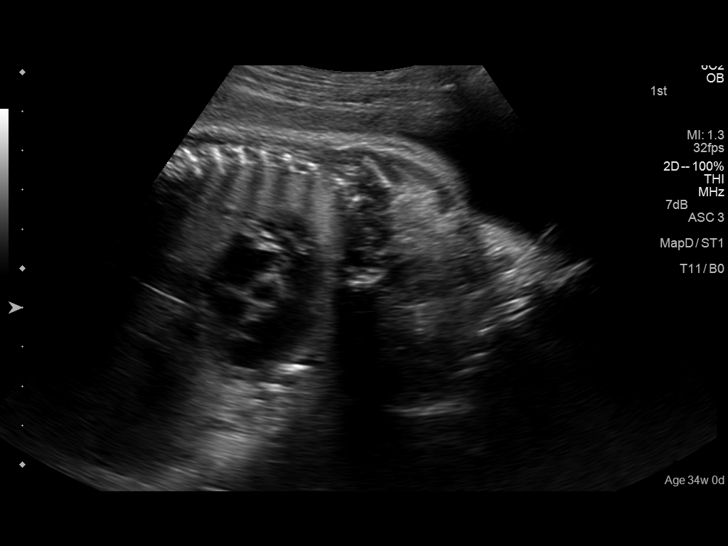
[im 44/66]
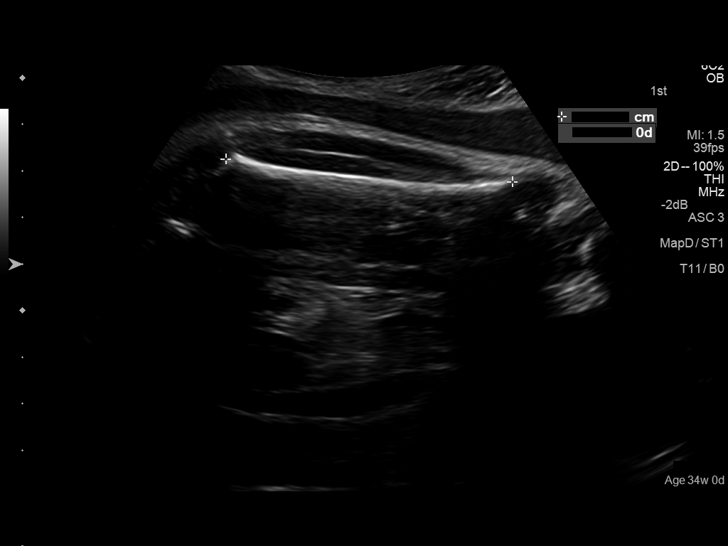
[im 49/66]
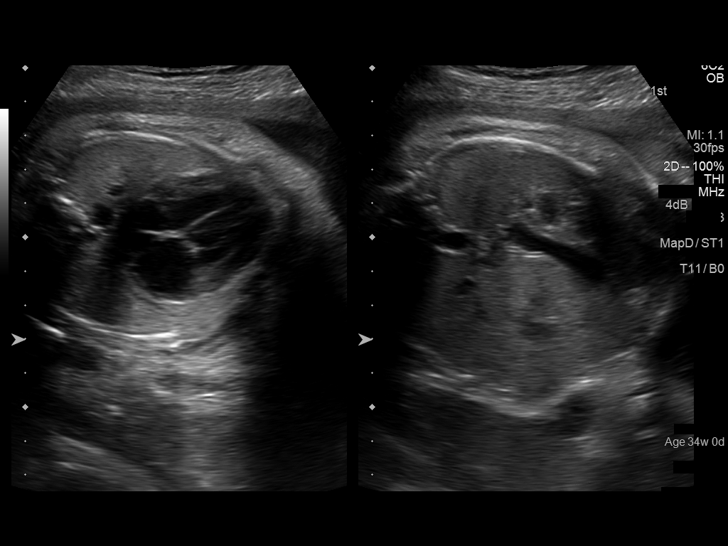
[im 53/66]
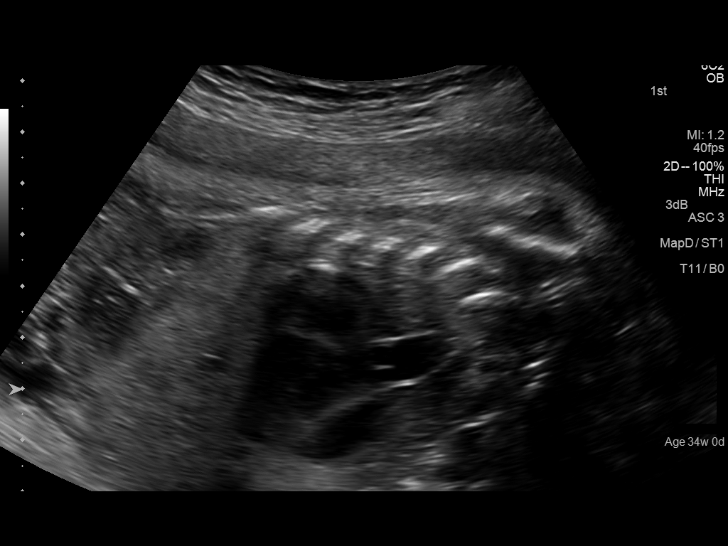
[im 58/66]
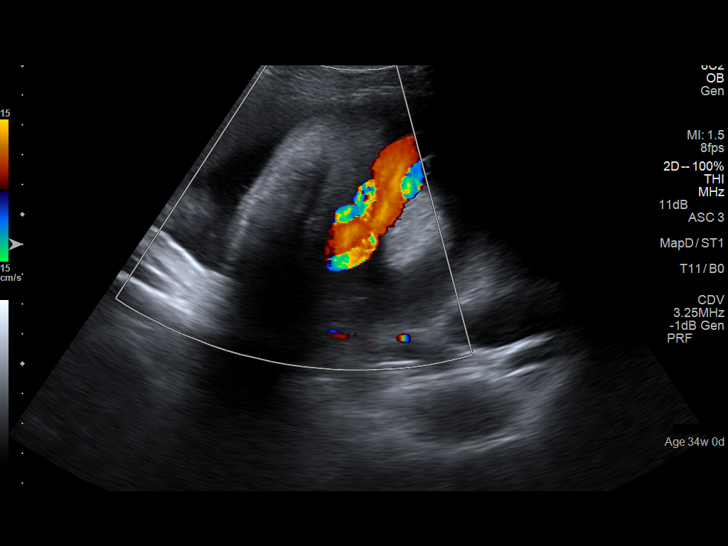
[im 63/66]
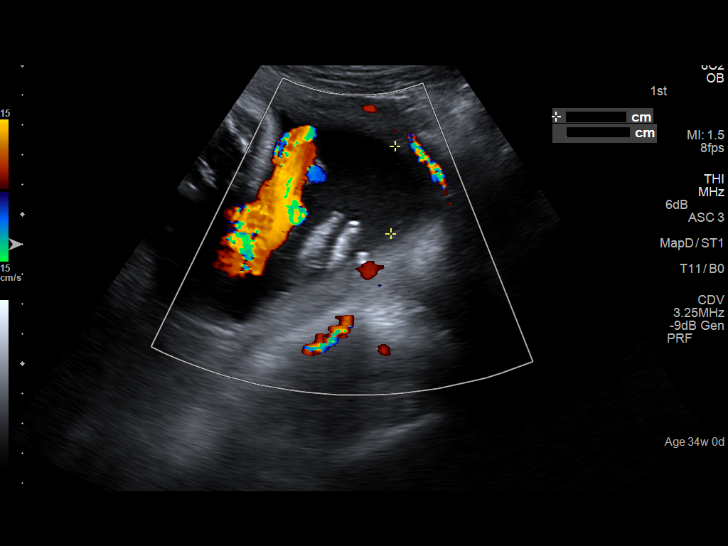

[13 of 28 positions shown; findings below may reference images not displayed]

FINDINGS: Number of Fetuses: 1

Heart Rate:  139 bpm

Movement: Yes

Fetal Breathing:  Yes

Presentation: Cephalic

Previa: No

Placental Location:  Fundal and posterior

Amniotic Fluid (Subjective): Normal

Amniotic Fluid (Objective):

AFI 11.3 cm (5%ile= 8.1 cm, 95%= 24.8 cm for 34 wks)

FETAL BIOMETRY

BPD:  7.7cm 30w   5d

HC:    29.7cm  32w   6d

AC:   29.6cm  33w   4d

FL:   6.4cm  33w   0d

Current Mean GA: 32w 1d          US EDC: 05-12-2015

Assigned GA:  34w 0d              Assigned EDC:  04-29-2015

Estimated Fetal Weight:  2105g 18th%ile

FETAL ANATOMY

Lateral Ventricles:  Appear normal

Thalami/CSP:  Appear normal

Posterior Fossa:  Not visualized

Nuchal Region:  Not visualized    NFT= N/A

Upper Lip:  Appears normal

Spine:  Appears normal

4 Chamber Heart on Left:  Appears normal

LVOT:  Appears normal

RVOT:  Appears normal

Stomach on Left:  Appears normal

3 Vessel Cord:  Appears normal

Cord Insertion site:  Appears normal

Kidneys:  Appear normal

Bladder:  Appears normal

Extremities:  Previously seen

Technically difficult due to: Fetal position and advanced
gestational age.

MATERNAL FINDINGS:

Cervix:  3.4 cm transabdominal
IMPRESSION: Assigned gestational age is currently 34 weeks 0 days. Estimated
fetal weight is currently at 18th percentile.

No fetal anomalies seen involving visualized anatomy noted above.

Amniotic fluid volume within normal limits, with AFI of 11.3 cm.

## 2017-05-27 DIAGNOSIS — R8271 Bacteriuria: Secondary | ICD-10-CM | POA: Insufficient documentation

## 2017-05-27 DIAGNOSIS — O9989 Other specified diseases and conditions complicating pregnancy, childbirth and the puerperium: Secondary | ICD-10-CM

## 2017-05-27 NOTE — L&D Delivery Note (Signed)
Delivery Summary for Casey Owens  Labor Events:   Preterm labor:   Rupture date: 10/10/2017  Rupture time: 5:22 PM  Rupture type: Artificial Intact  Fluid Color:   Induction:   Augmentation:   Complications:   Cervical ripening:          Delivery:   Episiotomy:   Lacerations:   Repair suture:   Repair # of packets:   Blood loss (ml): 200   Information for the patient's newborn:  Solangel, Mcmanaway [[id number]]    Delivery 10/10/2017 6:15 PM by  Vaginal, Spontaneous Sex:  female Gestational Age: [redacted]w[redacted]d Delivery Clinician:   Living?:         APGARS  One minute Five minutes Ten minutes  Skin color:        Heart rate:        Grimace:        Muscle tone:        Breathing:        Totals: 9  9      Presentation/position:      Resuscitation:   Cord information:    Disposition of cord blood:     Blood gases sent?  Complications:   Placenta: Delivered:       appearance Newborn Measurements: Weight: 5 lb (2268 g)  Height:    Head circumference:    Chest circumference:    Other providers:    Additional  information: Forceps:   Vacuum:   Breech:   Observed anomalies       Delivery Note At 6:15 PM a viable and healthy female was delivered via Vaginal, Spontaneous (Presentation: Vertex; LOA).  APGAR: 9, 9; weight 5 lb (2268 g), delivered by LDR nurse San Jetty, due to precipitous delivery.   Placenta status: spontaneously removed, intact.  Cord: 3-vessel with the following complications: None.  Cord pH: not obtained. Cord blood obtained.   Anesthesia: None Episiotomy: None Lacerations: None Suture Repair: None Est. Blood Loss (mL): 200    Mom to postpartum.  Baby to Couplet care / Skin to Skin.  Hildred Laser 10/10/2017, 6:38 PM

## 2017-05-28 ENCOUNTER — Encounter: Payer: Self-pay | Admitting: Student

## 2017-05-28 ENCOUNTER — Ambulatory Visit (INDEPENDENT_AMBULATORY_CARE_PROVIDER_SITE_OTHER): Payer: Self-pay | Admitting: Student

## 2017-05-28 VITALS — BP 114/78 | HR 91 | Wt 130.0 lb

## 2017-05-28 DIAGNOSIS — R8271 Bacteriuria: Secondary | ICD-10-CM

## 2017-05-28 DIAGNOSIS — O9989 Other specified diseases and conditions complicating pregnancy, childbirth and the puerperium: Secondary | ICD-10-CM

## 2017-05-28 DIAGNOSIS — O99332 Smoking (tobacco) complicating pregnancy, second trimester: Secondary | ICD-10-CM

## 2017-05-28 DIAGNOSIS — O0992 Supervision of high risk pregnancy, unspecified, second trimester: Secondary | ICD-10-CM

## 2017-05-28 DIAGNOSIS — O99891 Other specified diseases and conditions complicating pregnancy: Secondary | ICD-10-CM

## 2017-05-28 DIAGNOSIS — O09219 Supervision of pregnancy with history of pre-term labor, unspecified trimester: Secondary | ICD-10-CM

## 2017-05-28 NOTE — Progress Notes (Signed)
   PRENATAL VISIT NOTE  Subjective:  Casey Owens is a 27 y.o. [id number] at 8218w1d being seen today for ongoing prenatal care.  She is currently monitored for the following issues for this low-risk pregnancy and has Tobacco use in pregnancy; Supervision of high-risk pregnancy; History of drug use; Previous preterm delivery, antepartum; and Asymptomatic bacteriuria during pregnancy in second trimester on their problem list.  Patient reports no complaints.  Contractions: Not present. Vag. Bleeding: None.  Movement: Present. Denies leaking of fluid.   The following portions of the patient's history were reviewed and updated as appropriate: allergies, current medications, past family history, past medical history, past social history, past surgical history and problem list. Problem list updated.  Objective:   Vitals:   05/28/17 1429  BP: 114/78  Pulse: 91  Weight: 130 lb (59 kg)    Fetal Status:     Movement: Present     General:  Alert, oriented and cooperative. Patient is in no acute distress.  Skin: Skin is warm and dry. No rash noted.   Cardiovascular: Normal heart rate noted  Respiratory: Normal respiratory effort, no problems with respiration noted  Abdomen: Soft, gravid, appropriate for gestational age.  Pain/Pressure: Absent     Pelvic: Cervical exam deferred        Extremities: Normal range of motion.  Edema: None  Mental Status:  Normal mood and affect. Normal behavior. Normal judgment and thought content.   Assessment and Plan:  Pregnancy: [id number] at 1918w1d  1. Asymptomatic bacteriuria during pregnancy in second trimester No s/s of UTI in pregnancy  2. Supervision of high risk pregnancy in second trimester  - US MFM OB DETAIL +14 WK; Future  3. Maternal tobacco use in second trimester Patient knows she needs to quit, is trying.   4. Previous preterm delivery, antepartum Declined 17p; says that it makes the knot in her back worse.   Preterm labor symptoms and  general obstetric precautions including but not limited to vaginal bleeding, contractions, leaking of fluid and fetal movement were reviewed in detail with the patient. Please refer to After Visit Summary for other counseling recommendations.  Return in about 4 weeks (around 06/25/2017), or LROB.   Marylene LandKathryn Lorraine Kooistra, CNM

## 2017-05-28 NOTE — Patient Instructions (Signed)

## 2017-06-10 IMAGING — DX DG CHEST 1V PORT
1 series · 1 of 1 positions shown · non-contrast
Comparison: 03/11/2007

CLINICAL DATA: Post partum X 1 day-pt began vomiting this am about
1721. Shielded. Smoker. Hx of asthma.

EXAM:
PORTABLE CHEST 1 VIEW

[chest ap]
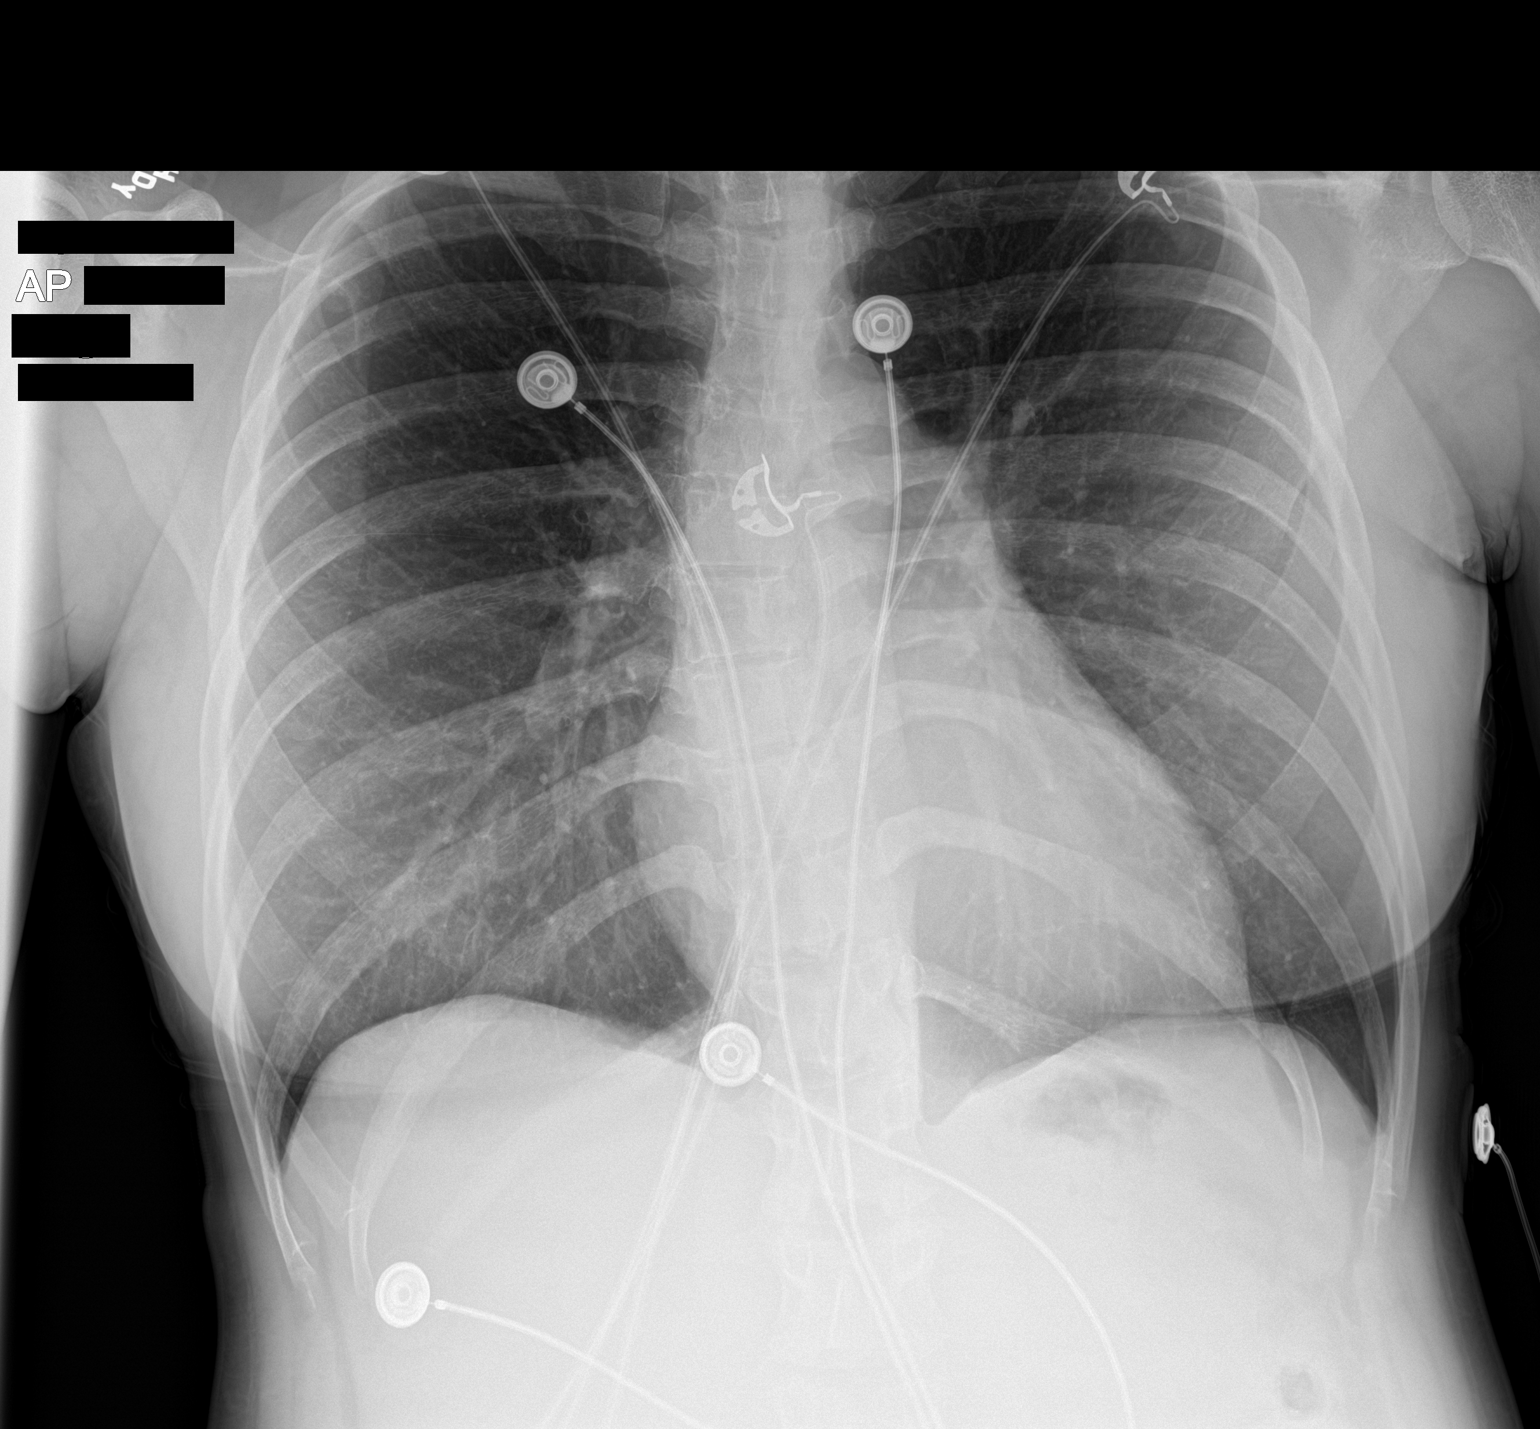

[1 of 1 positions shown; findings below may reference images not displayed]

FINDINGS: The heart size and mediastinal contours are within normal limits.
Both lungs are clear. The visualized skeletal structures are
unremarkable.
IMPRESSION: No active disease.

## 2017-06-10 IMAGING — DX DG ABDOMEN 2V
3 series · 3 of 3 positions shown · non-contrast
Comparison: Chest radiograph earlier same day

CLINICAL DATA: Patient with vomiting.  Postpartum.

EXAM:
ABDOMEN - 2 VIEW

[abdomen erect]
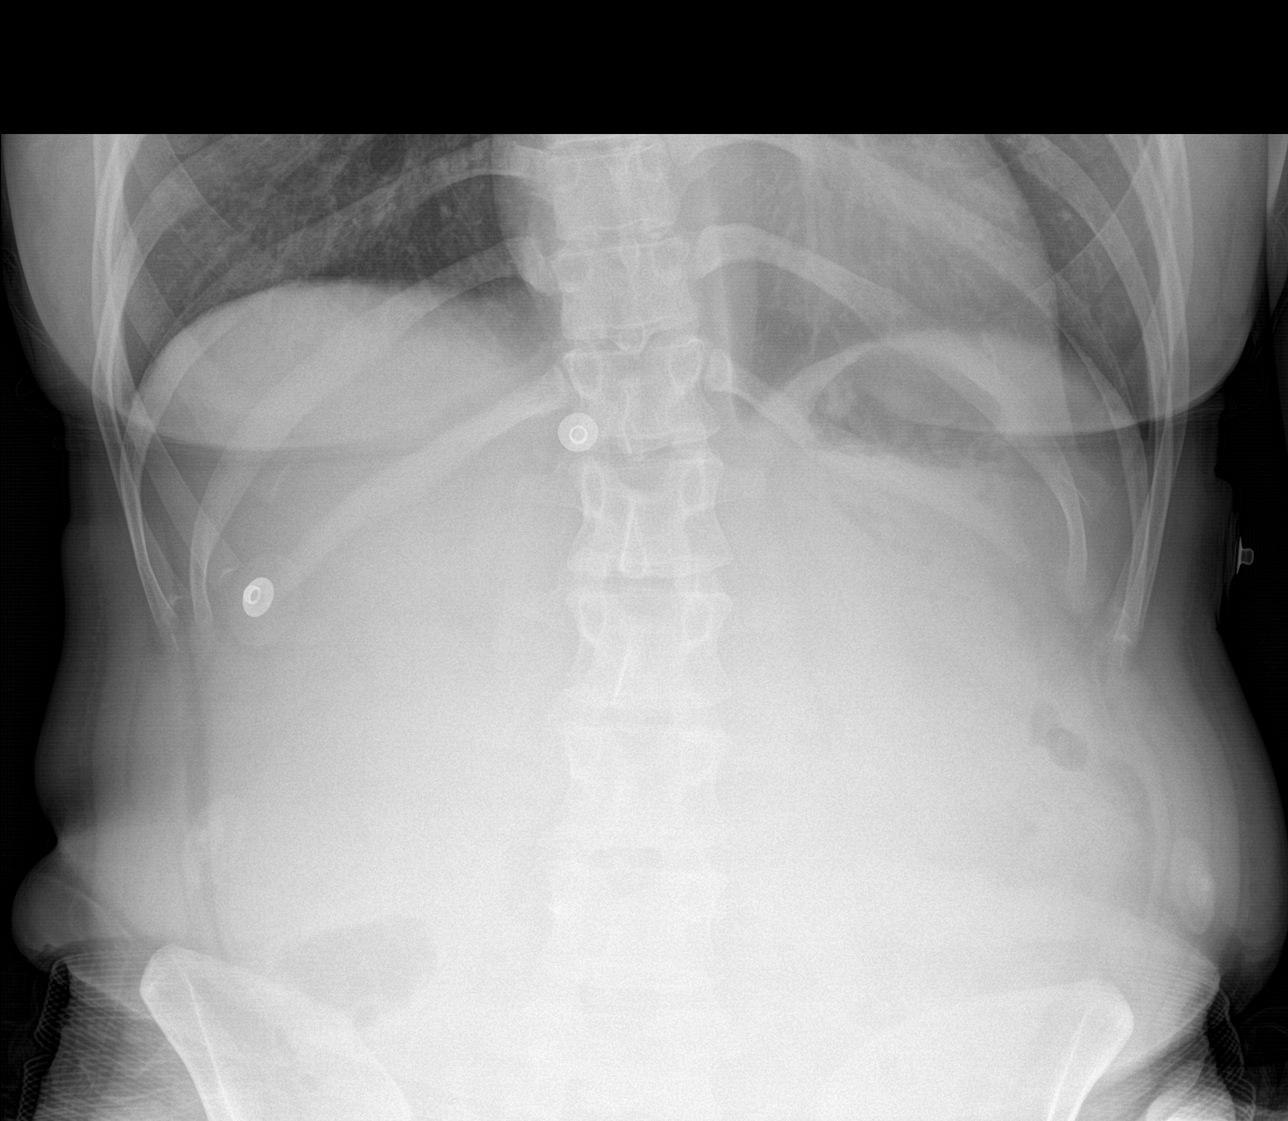

[abdomen supine (1 of 2)]
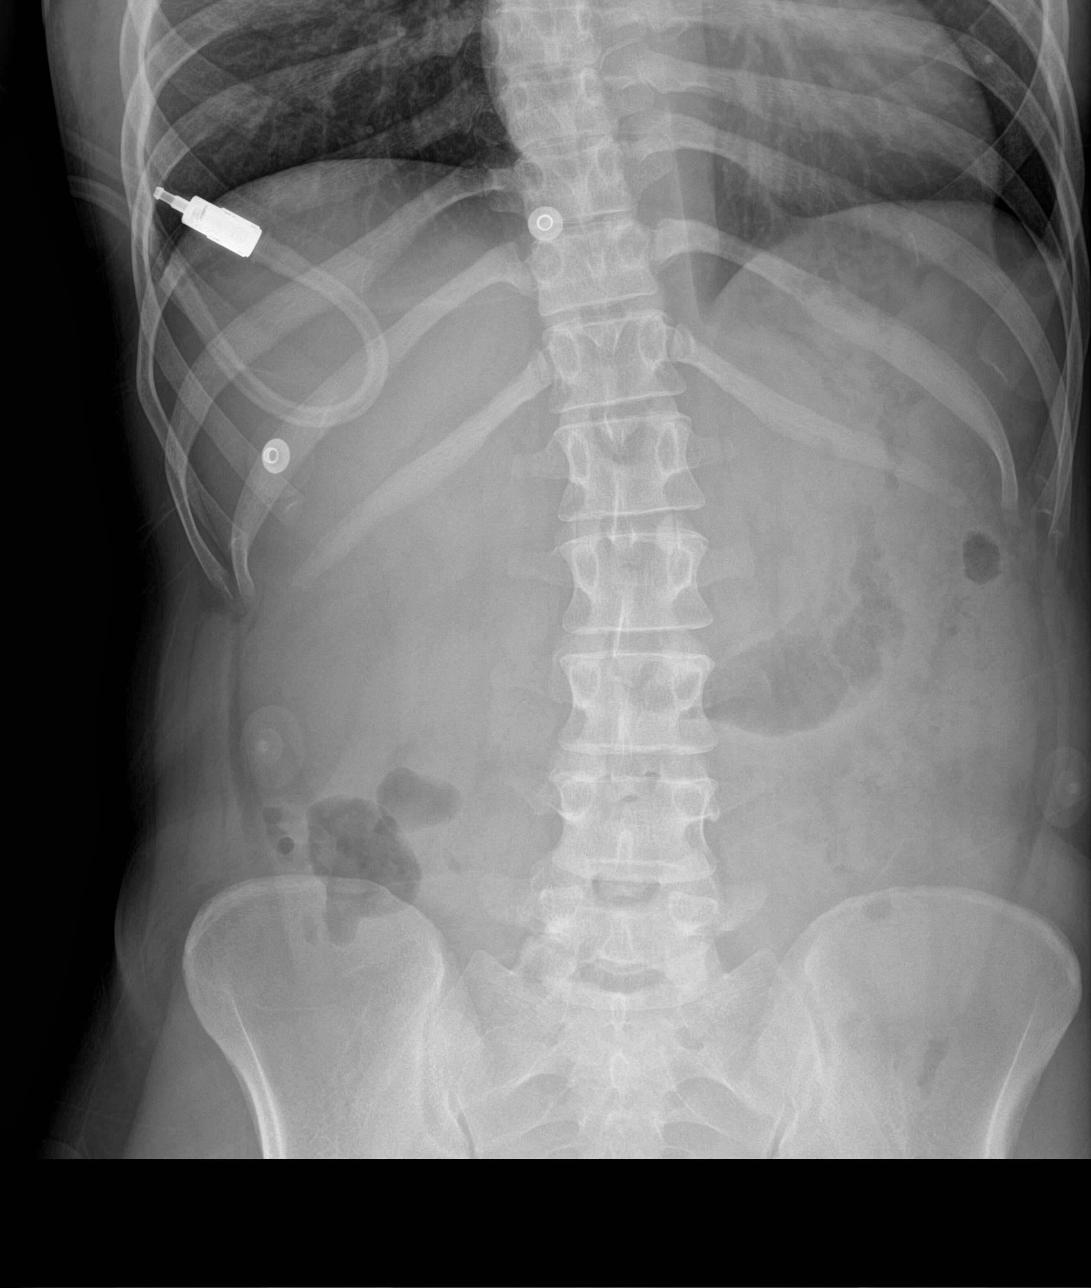

[abdomen supine (2 of 2)]
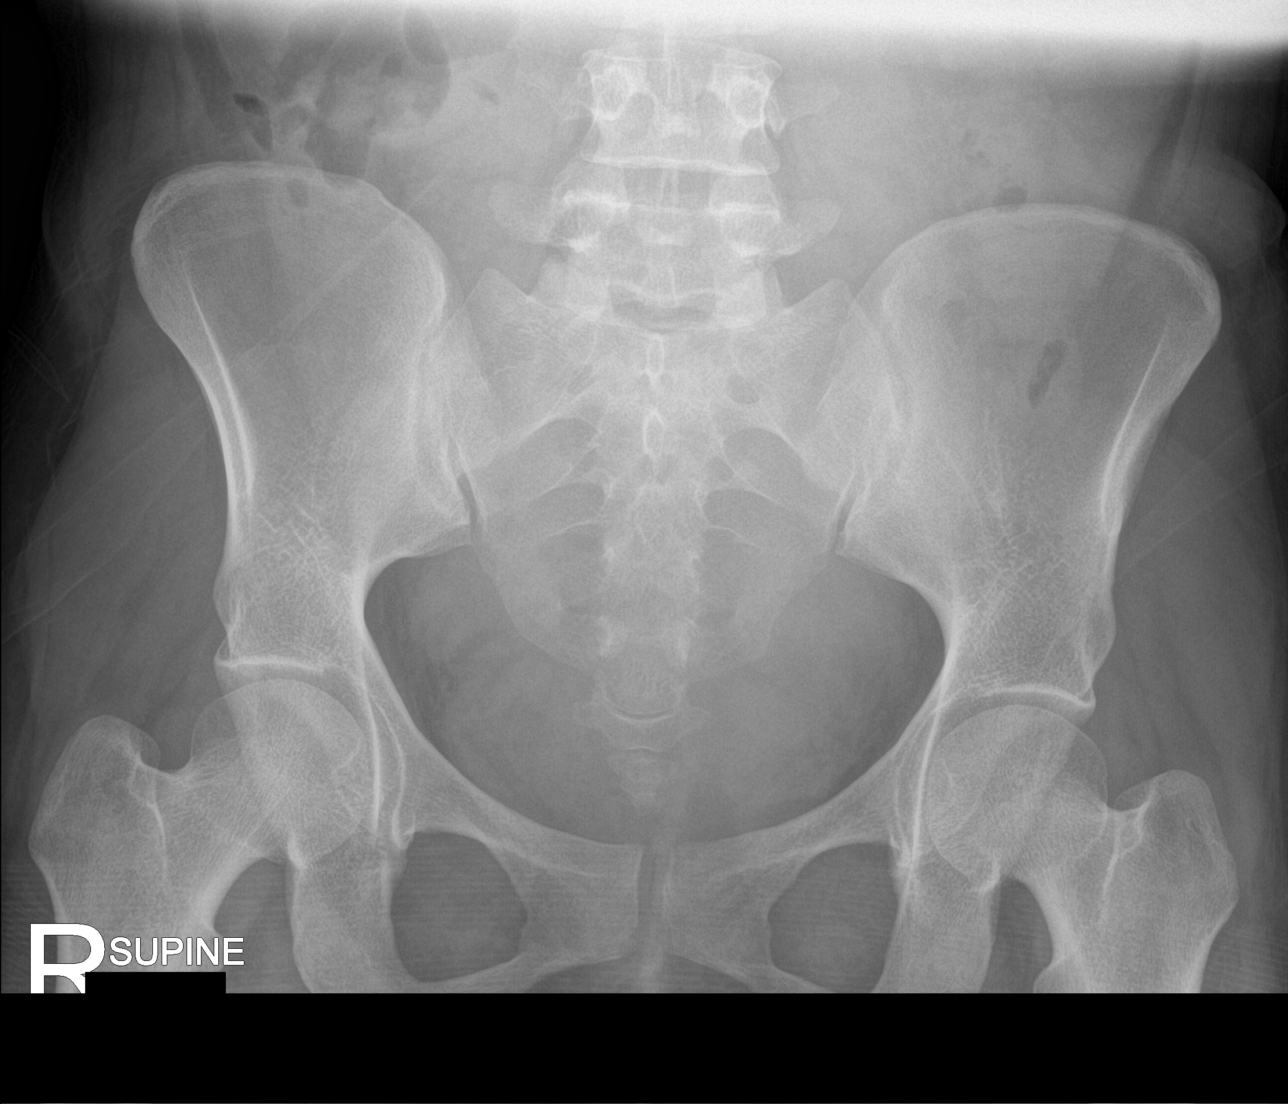

[3 of 3 positions shown; findings below may reference images not displayed]

FINDINGS: Lung bases are clear. Gas is demonstrated within nondilated loops of
large and small bowel in a nonobstructed pattern. Osseous skeleton
is unremarkable.
IMPRESSION: Nonobstructed bowel gas pattern.

## 2017-06-18 ENCOUNTER — Encounter (HOSPITAL_COMMUNITY): Payer: Self-pay | Admitting: Student

## 2017-06-25 ENCOUNTER — Encounter: Payer: Self-pay | Admitting: Obstetrics and Gynecology

## 2017-06-25 ENCOUNTER — Encounter (HOSPITAL_COMMUNITY): Payer: Self-pay

## 2017-06-25 ENCOUNTER — Ambulatory Visit (HOSPITAL_COMMUNITY)
Admission: RE | Admit: 2017-06-25 | Discharge: 2017-06-25 | Disposition: A | Discharge status: Home / Self Care | Payer: Self-pay | Source: Ambulatory Visit | Attending: Student | Admitting: Student

## 2017-06-25 ENCOUNTER — Other Ambulatory Visit (HOSPITAL_COMMUNITY): Payer: Self-pay | Admitting: *Deleted

## 2017-06-25 ENCOUNTER — Ambulatory Visit (INDEPENDENT_AMBULATORY_CARE_PROVIDER_SITE_OTHER): Payer: Self-pay | Admitting: Obstetrics and Gynecology

## 2017-06-25 ENCOUNTER — Other Ambulatory Visit: Payer: Self-pay | Admitting: Student

## 2017-06-25 VITALS — BP 129/56 | HR 89 | Wt 134.0 lb

## 2017-06-25 DIAGNOSIS — O99332 Smoking (tobacco) complicating pregnancy, second trimester: Secondary | ICD-10-CM

## 2017-06-25 DIAGNOSIS — O9989 Other specified diseases and conditions complicating pregnancy, childbirth and the puerperium: Secondary | ICD-10-CM

## 2017-06-25 DIAGNOSIS — O0992 Supervision of high risk pregnancy, unspecified, second trimester: Secondary | ICD-10-CM | POA: Insufficient documentation

## 2017-06-25 DIAGNOSIS — Z23 Encounter for immunization: Secondary | ICD-10-CM

## 2017-06-25 DIAGNOSIS — O09899 Supervision of other high risk pregnancies, unspecified trimester: Secondary | ICD-10-CM

## 2017-06-25 DIAGNOSIS — Z3686 Encounter for antenatal screening for cervical length: Secondary | ICD-10-CM | POA: Insufficient documentation

## 2017-06-25 DIAGNOSIS — O09219 Supervision of pregnancy with history of pre-term labor, unspecified trimester: Secondary | ICD-10-CM | POA: Insufficient documentation

## 2017-06-25 DIAGNOSIS — Z3689 Encounter for other specified antenatal screening: Secondary | ICD-10-CM

## 2017-06-25 DIAGNOSIS — O09212 Supervision of pregnancy with history of pre-term labor, second trimester: Secondary | ICD-10-CM

## 2017-06-25 DIAGNOSIS — O402XX0 Polyhydramnios, second trimester, not applicable or unspecified: Secondary | ICD-10-CM | POA: Insufficient documentation

## 2017-06-25 DIAGNOSIS — Z3A20 20 weeks gestation of pregnancy: Secondary | ICD-10-CM | POA: Insufficient documentation

## 2017-06-25 DIAGNOSIS — R8271 Bacteriuria: Secondary | ICD-10-CM

## 2017-06-25 DIAGNOSIS — Z362 Encounter for other antenatal screening follow-up: Secondary | ICD-10-CM

## 2017-06-25 DIAGNOSIS — O99891 Other specified diseases and conditions complicating pregnancy: Secondary | ICD-10-CM

## 2017-06-25 NOTE — Progress Notes (Signed)
Pt stopped Prozac approximately a couple months ago. C/O Right knee pain since weight gain from pregnancy

## 2017-06-25 NOTE — Progress Notes (Signed)
Prenatal Visit Note Date: 06/25/2017 Clinic: [facility]  Subjective:  Casey Owens is a 27 y.o. [id number] at 266w1d being seen today for ongoing prenatal care.  She is currently monitored for the following issues for this high-risk pregnancy and has Tobacco use in pregnancy; Supervision of high-risk pregnancy; History of drug use; Previous preterm delivery, antepartum; and Asymptomatic bacteriuria during pregnancy in second trimester on their problem list.  Patient reports right knee discomfort with walking; feels joint related.   Contractions: Not present. Vag. Bleeding: None.  Movement: Present. Denies leaking of fluid.   The following portions of the patient's history were reviewed and updated as appropriate: allergies, current medications, past family history, past medical history, past social history, past surgical history and problem list. Problem list updated.  Objective:   Vitals:   06/25/17 0909  BP: (!) 129/56  Pulse: 89  Weight: 134 lb (60.8 kg)    Fetal Status: Fetal Heart Rate (bpm): 151   Movement: Present     General:  Alert, oriented and cooperative. Patient is in no acute distress.  Skin: Skin is warm and dry. No rash noted.   Cardiovascular: Normal heart rate noted  Respiratory: Normal respiratory effort, no problems with respiration noted  Abdomen: Soft, gravid, appropriate for gestational age. Pain/Pressure: Absent     Pelvic:  Cervical exam deferred        Extremities: Normal range of motion.  Edema: None  Mental Status: Normal mood and affect. Normal behavior. Normal judgment and thought content.   Urinalysis:      Assessment and Plan:  Pregnancy: [id number] at 6766w1d  1. Supervision of high risk pregnancy in second trimester Routine care. F/u anatomy u/s today. Pt amenable to quad screen - Culture, OB Urine - US MFM OB Transvaginal; Future - AFP TETRA  2. Immunization due - Flu Vaccine QUAD 36+ mos IM (Fluarix, Quad PF)  3.  Asymptomatic bacteriuria during pregnancy in second trimester toc today - Culture, OB Urine  4. Previous preterm delivery, antepartum Pt declines 17p or ppx vag progesterone. Pt amenable to serial CL. F/u today's and one ordered for 2wks - US MFM OB Transvaginal; Future  Preterm labor symptoms and general obstetric precautions including but not limited to vaginal bleeding, contractions, leaking of fluid and fetal movement were reviewed in detail with the patient. Please refer to After Visit Summary for other counseling recommendations.  Return in about 4 weeks (around 07/23/2017) for rob.   Ambia BingPickens, Ezzard Ditmer, MD

## 2017-06-26 ENCOUNTER — Other Ambulatory Visit (HOSPITAL_COMMUNITY): Payer: Self-pay | Admitting: *Deleted

## 2017-06-26 DIAGNOSIS — O26879 Cervical shortening, unspecified trimester: Secondary | ICD-10-CM

## 2017-06-27 LAB — AFP TETRA
DIA MOM VALUE: 1.87
DIA VALUE (EIA): 402.13 pg/mL
DSR (By Age)    1 IN: 820
DSR (SECOND TRIMESTER) 1 IN: 1670
GESTATIONAL AGE AFP: 20.1 wk
MATERNAL AGE AT EDD: 28.5 a
MSAFP Mom: 0.82
MSAFP: 55.2 ng/mL
MSHCG MOM: 0.73
MSHCG: 18447 m[IU]/mL
Osb Risk: 10000
T18 (By Age): 1:3194 {titer}
TEST RESULTS AFP: NEGATIVE
Weight: 134 [lb_av]
uE3 Mom: 0.93
uE3 Value: 1.78 ng/mL

## 2017-06-29 LAB — URINE CULTURE, OB REFLEX

## 2017-06-29 LAB — CULTURE, OB URINE

## 2017-07-01 ENCOUNTER — Other Ambulatory Visit: Payer: Self-pay

## 2017-07-01 MED ORDER — NITROFURANTOIN MONOHYD MACRO 100 MG PO CAPS: 100.0000 mg | ORAL_CAPSULE | Freq: Two times a day (BID) | ORAL | 0 refills | Status: DC

## 2017-07-01 NOTE — Telephone Encounter (Signed)
Inform patient of test results and need to start Macrobid  BV

## 2017-07-09 ENCOUNTER — Ambulatory Visit (HOSPITAL_COMMUNITY)
Admission: RE | Admit: 2017-07-09 | Discharge: 2017-07-09 | Disposition: A | Discharge status: Home / Self Care | Payer: Self-pay | Source: Ambulatory Visit | Attending: Obstetrics and Gynecology | Admitting: Obstetrics and Gynecology

## 2017-07-15 ENCOUNTER — Observation Stay
Admission: EM | Admit: 2017-07-15 | Discharge: 2017-07-16 | Disposition: A | Discharge status: Home / Self Care | Payer: Medicaid Other | Attending: Obstetrics and Gynecology | Admitting: Obstetrics and Gynecology

## 2017-07-15 ENCOUNTER — Other Ambulatory Visit: Payer: Self-pay

## 2017-07-15 DIAGNOSIS — R109 Unspecified abdominal pain: Secondary | ICD-10-CM | POA: Diagnosis not present

## 2017-07-15 DIAGNOSIS — O26892 Other specified pregnancy related conditions, second trimester: Secondary | ICD-10-CM | POA: Diagnosis not present

## 2017-07-15 DIAGNOSIS — F1991 Other psychoactive substance use, unspecified, in remission: Secondary | ICD-10-CM

## 2017-07-15 DIAGNOSIS — R112 Nausea with vomiting, unspecified: Secondary | ICD-10-CM | POA: Diagnosis not present

## 2017-07-15 DIAGNOSIS — O0992 Supervision of high risk pregnancy, unspecified, second trimester: Secondary | ICD-10-CM

## 2017-07-15 DIAGNOSIS — R197 Diarrhea, unspecified: Secondary | ICD-10-CM | POA: Insufficient documentation

## 2017-07-15 DIAGNOSIS — Z3A23 23 weeks gestation of pregnancy: Secondary | ICD-10-CM | POA: Insufficient documentation

## 2017-07-15 DIAGNOSIS — K529 Noninfective gastroenteritis and colitis, unspecified: Secondary | ICD-10-CM | POA: Diagnosis present

## 2017-07-15 DIAGNOSIS — Z87898 Personal history of other specified conditions: Secondary | ICD-10-CM

## 2017-07-15 LAB — URINALYSIS, ROUTINE W REFLEX MICROSCOPIC
BILIRUBIN URINE: NEGATIVE
Glucose, UA: NEGATIVE mg/dL
Hgb urine dipstick: NEGATIVE
KETONES UR: NEGATIVE mg/dL
Leukocytes, UA: NEGATIVE
Nitrite: NEGATIVE
PH: 5 (ref 5.0–8.0)
Protein, ur: NEGATIVE mg/dL
Specific Gravity, Urine: 1.025 (ref 1.005–1.030)

## 2017-07-15 MED ORDER — ONDANSETRON 4 MG PO TBDP: 4.0000 mg | ORAL_TABLET | Freq: Four times a day (QID) | ORAL | 0 refills | Status: DC | PRN

## 2017-07-15 MED ORDER — BISMUTH SUBSALICYLATE 262 MG PO CHEW: 524.0000 mg | CHEWABLE_TABLET | ORAL | Status: DC | PRN

## 2017-07-15 MED ORDER — PROMETHAZINE HCL 25 MG/ML IJ SOLN
25.0000 mg | Freq: Four times a day (QID) | INTRAMUSCULAR | Status: DC | PRN
  Administered 2017-07-15: 25 mg via INTRAVENOUS
  Filled 2017-07-15: qty 1

## 2017-07-15 MED ORDER — ONDANSETRON 4 MG PO TBDP
4.0000 mg | ORAL_TABLET | Freq: Four times a day (QID) | ORAL | Status: DC | PRN
  Administered 2017-07-15: 4 mg via ORAL
  Filled 2017-07-15: qty 1

## 2017-07-15 MED ORDER — BISMUTH SUBSALICYLATE 262 MG/15ML PO SUSP: 30.0000 mL | Freq: Once | ORAL | Status: DC

## 2017-07-15 MED ORDER — LACTATED RINGERS IV BOLUS (SEPSIS)
1000.0000 mL | Freq: Once | INTRAVENOUS | Status: AC
  Administered 2017-07-15: 1000 mL via INTRAVENOUS

## 2017-07-15 MED ORDER — BISMUTH SUBSALICYLATE 262 MG/15ML PO SUSP
30.0000 mL | ORAL | Status: DC | PRN
  Administered 2017-07-15: 30 mL via ORAL
  Filled 2017-07-15: qty 118

## 2017-07-15 NOTE — OB Triage Note (Addendum)
Pt states she has been vomiting since 8 am this morning. Pt states she has had abdominal pain since this morning as well and rates it an 8/10. Pt denies vaginal bleeding and LOF. Pt states positive fetal movement. Pt states she has had two ctx since being seen in triage.

## 2017-07-15 NOTE — Final Progress Note (Signed)
L&D OB Triage Note  Casey Owens is a 27 y.o. [id number] female at 870w0d, EDD Estimated Date of Delivery: 11/11/17 who presented to triage for complaints of nausea/vomiting since ~ 0800 today and abdominal pain, 8/10. She also reported some diarrhea. She does report sick contacts at home.  In addition, while in the ER, she reported feeling 2 contractions. She was evaluated by the nurses with findings significant for viral gastroenteritis.   Vital signs stable.  She was treated with IVF, Zofran, and Pepto-Bismol.   NST INTERPRETATION: Indications: rule out uterine contractions  Mode: External Baseline Rate (A): 145 bpm(fht)       Labs:  Results for orders placed or performed during the hospital encounter of 07/15/17  Urinalysis, Routine w reflex microscopic  Result Value Ref Range   Color, Urine YELLOW (A) YELLOW   APPearance HAZY (A) CLEAR   Specific Gravity, Urine 1.025 1.005 - 1.030   pH 5.0 5.0 - 8.0   Glucose, UA NEGATIVE NEGATIVE mg/dL   Hgb urine dipstick NEGATIVE NEGATIVE   Bilirubin Urine NEGATIVE NEGATIVE   Ketones, ur NEGATIVE NEGATIVE mg/dL   Protein, ur NEGATIVE NEGATIVE mg/dL   Nitrite NEGATIVE NEGATIVE   Leukocytes, UA NEGATIVE NEGATIVE    Plan: FHT obtained, no contractions noted on tocometer.  She was discharged home with handwashing precautions, BRAT diet.  She will be prescribed Zofran   Continue routine prenatal care. Follow up with OB/GYN as previously scheduled.     Casey LaserAnika Lennart Gladish, MD [facility]

## 2017-07-15 NOTE — ED Triage Notes (Signed)
First nurse note. Pt with sick children at home with N/V since 0800 today. N/V in lobby. C/o abd pain that started in lobby. Denies bleeding, discharge. 5th pregnancy.

## 2017-07-15 NOTE — OB Triage Note (Signed)
Patient given discharge instructions, verbal and written, including follow up instructions. Pt. verbalized understanding. Patient discharged via wheelchair accompanied by K. Jamieson Hetland RN and family member.

## 2017-07-16 DIAGNOSIS — O26892 Other specified pregnancy related conditions, second trimester: Secondary | ICD-10-CM | POA: Diagnosis not present

## 2017-07-23 ENCOUNTER — Ambulatory Visit (INDEPENDENT_AMBULATORY_CARE_PROVIDER_SITE_OTHER): Payer: Self-pay | Admitting: Obstetrics and Gynecology

## 2017-07-23 ENCOUNTER — Encounter: Payer: Self-pay | Admitting: Obstetrics and Gynecology

## 2017-07-23 ENCOUNTER — Other Ambulatory Visit (HOSPITAL_COMMUNITY): Payer: Self-pay | Admitting: Maternal & Fetal Medicine

## 2017-07-23 ENCOUNTER — Ambulatory Visit (HOSPITAL_COMMUNITY)
Admission: RE | Admit: 2017-07-23 | Discharge: 2017-07-23 | Disposition: A | Discharge status: Home / Self Care | Payer: Medicaid Other | Source: Ambulatory Visit | Attending: Student | Admitting: Student

## 2017-07-23 VITALS — BP 120/79 | HR 92 | Wt 146.0 lb

## 2017-07-23 DIAGNOSIS — O26879 Cervical shortening, unspecified trimester: Secondary | ICD-10-CM

## 2017-07-23 DIAGNOSIS — Z3686 Encounter for antenatal screening for cervical length: Secondary | ICD-10-CM

## 2017-07-23 DIAGNOSIS — O0992 Supervision of high risk pregnancy, unspecified, second trimester: Secondary | ICD-10-CM

## 2017-07-23 DIAGNOSIS — O09212 Supervision of pregnancy with history of pre-term labor, second trimester: Secondary | ICD-10-CM

## 2017-07-23 DIAGNOSIS — Z362 Encounter for other antenatal screening follow-up: Secondary | ICD-10-CM

## 2017-07-23 DIAGNOSIS — O402XX0 Polyhydramnios, second trimester, not applicable or unspecified: Secondary | ICD-10-CM | POA: Insufficient documentation

## 2017-07-23 DIAGNOSIS — Z3A24 24 weeks gestation of pregnancy: Secondary | ICD-10-CM | POA: Diagnosis not present

## 2017-07-23 DIAGNOSIS — O9989 Other specified diseases and conditions complicating pregnancy, childbirth and the puerperium: Secondary | ICD-10-CM

## 2017-07-23 DIAGNOSIS — O26872 Cervical shortening, second trimester: Secondary | ICD-10-CM | POA: Diagnosis not present

## 2017-07-23 DIAGNOSIS — O09219 Supervision of pregnancy with history of pre-term labor, unspecified trimester: Secondary | ICD-10-CM

## 2017-07-23 DIAGNOSIS — R8271 Bacteriuria: Secondary | ICD-10-CM

## 2017-07-23 MED ORDER — CEPHALEXIN 500 MG PO CAPS: 500.0000 mg | ORAL_CAPSULE | Freq: Four times a day (QID) | ORAL | 0 refills | Status: AC

## 2017-07-23 NOTE — Progress Notes (Signed)
Prenatal Visit Note Date: 07/23/2017 Clinic: [facility]  Subjective:  Christoper FabianDmtriev S Savant is a 27 y.o. [id number] at 6944w1d being seen today for ongoing prenatal care.  She is currently monitored for the following issues for this high-risk pregnancy and has Tobacco use in pregnancy; Supervision of high-risk pregnancy; History of drug use; Previous preterm delivery, antepartum; and Asymptomatic bacteriuria during pregnancy in second trimester on their problem list.  Patient reports no complaints.   Contractions: Not present. Vag. Bleeding: None.  Movement: Present. Denies leaking of fluid.   The following portions of the patient's history were reviewed and updated as appropriate: allergies, current medications, past family history, past medical history, past social history, past surgical history and problem list. Problem list updated.  Objective:   Vitals:   07/23/17 0902  BP: 120/79  Pulse: 92  Weight: 146 lb (66.2 kg)    Fetal Status: Fetal Heart Rate (bpm): 145 Fundal Height: 24 cm Movement: Present     General:  Alert, oriented and cooperative. Patient is in no acute distress.  Skin: Skin is warm and dry. No rash noted.   Cardiovascular: Normal heart rate noted  Respiratory: Normal respiratory effort, no problems with respiration noted  Abdomen: Soft, gravid, appropriate for gestational age. Pain/Pressure: Absent     Pelvic:  Cervical exam deferred        Extremities: Normal range of motion.     Mental Status: Normal mood and affect. Normal behavior. Normal judgment and thought content.   Urinalysis:      Assessment and Plan:  Pregnancy: [id number] at 5344w1d  1. Supervision of high risk pregnancy in second trimester Routine care. Waiting on medicaid to come through. D/w her to bring card nv for btl paperwork. 28wk labs nv  2. Previous preterm delivery, antepartum Declined 17p  3. Asymptomatic bacteriuria during pregnancy in second trimester Didn't take the  macrobid that was sent in. Will do keflex  Preterm labor symptoms and general obstetric precautions including but not limited to vaginal bleeding, contractions, leaking of fluid and fetal movement were reviewed in detail with the patient. Please refer to After Visit Summary for other counseling recommendations.  Return in about 3 weeks (around 08/13/2017) for rob, 2hr GTT.   [place]a BingPickens, Jasmeen Fritsch, MD

## 2017-07-26 LAB — URINE CULTURE, OB REFLEX

## 2017-07-26 LAB — CULTURE, OB URINE

## 2017-07-30 ENCOUNTER — Telehealth: Payer: Self-pay

## 2017-07-30 ENCOUNTER — Other Ambulatory Visit: Payer: Self-pay | Admitting: Family Medicine

## 2017-07-30 ENCOUNTER — Telehealth: Payer: Self-pay | Admitting: Family Medicine

## 2017-07-30 DIAGNOSIS — O409XX0 Polyhydramnios, unspecified trimester, not applicable or unspecified: Secondary | ICD-10-CM | POA: Insufficient documentation

## 2017-07-30 DIAGNOSIS — O3430 Maternal care for cervical incompetence, unspecified trimester: Secondary | ICD-10-CM | POA: Insufficient documentation

## 2017-07-30 MED ORDER — PROGESTERONE MICRONIZED 200 MG PO CAPS: ORAL_CAPSULE | ORAL | 3 refills | Status: DC

## 2017-07-30 NOTE — Telephone Encounter (Signed)
Spoke to pt and she stated she took abx for UTI and applied for pregnancy medicaid last Wednesday.  She is aware progesterone will be sent to her pharmacy and can pick up within an hour.

## 2017-07-30 NOTE — Telephone Encounter (Signed)
-----   Message from Eulis FosterKristen Quinn, MD sent at 07/23/2017 10:11 PM EST ----- [name],  I saw [name] today for an ultrasound. Her cervical length was borderline and I see that she declined 17P. Given her history of 3 previous preterm deliveries I think she should be offered vaginal progesterone and she should have a repeat cervical length in 1 week ([facility]). I would also get a glucola sooner rather than later given her polyhydramnios.  Let me know if you have any questions.  Thanks, WPS ResourcesKristen

## 2017-07-30 NOTE — Telephone Encounter (Signed)
Call patient to discuss her results- no answer or voice mail.

## 2017-08-08 ENCOUNTER — Other Ambulatory Visit: Payer: Self-pay

## 2017-08-08 ENCOUNTER — Observation Stay
Admission: EM | Admit: 2017-08-08 | Discharge: 2017-08-08 | Disposition: A | Discharge status: Home / Self Care | Payer: Medicaid Other | Attending: Obstetrics and Gynecology | Admitting: Obstetrics and Gynecology

## 2017-08-08 ENCOUNTER — Encounter: Payer: Self-pay | Admitting: *Deleted

## 2017-08-08 DIAGNOSIS — Z87898 Personal history of other specified conditions: Secondary | ICD-10-CM

## 2017-08-08 DIAGNOSIS — Z3A26 26 weeks gestation of pregnancy: Secondary | ICD-10-CM | POA: Diagnosis not present

## 2017-08-08 DIAGNOSIS — O09212 Supervision of pregnancy with history of pre-term labor, second trimester: Secondary | ICD-10-CM | POA: Diagnosis not present

## 2017-08-08 DIAGNOSIS — O3430 Maternal care for cervical incompetence, unspecified trimester: Secondary | ICD-10-CM

## 2017-08-08 DIAGNOSIS — O99332 Smoking (tobacco) complicating pregnancy, second trimester: Secondary | ICD-10-CM | POA: Insufficient documentation

## 2017-08-08 DIAGNOSIS — J45909 Unspecified asthma, uncomplicated: Secondary | ICD-10-CM | POA: Insufficient documentation

## 2017-08-08 DIAGNOSIS — O99512 Diseases of the respiratory system complicating pregnancy, second trimester: Secondary | ICD-10-CM | POA: Insufficient documentation

## 2017-08-08 DIAGNOSIS — O99212 Obesity complicating pregnancy, second trimester: Secondary | ICD-10-CM

## 2017-08-08 DIAGNOSIS — O0992 Supervision of high risk pregnancy, unspecified, second trimester: Secondary | ICD-10-CM

## 2017-08-08 DIAGNOSIS — F329 Major depressive disorder, single episode, unspecified: Secondary | ICD-10-CM

## 2017-08-08 DIAGNOSIS — F17210 Nicotine dependence, cigarettes, uncomplicated: Secondary | ICD-10-CM | POA: Diagnosis not present

## 2017-08-08 DIAGNOSIS — O402XX0 Polyhydramnios, second trimester, not applicable or unspecified: Secondary | ICD-10-CM | POA: Diagnosis not present

## 2017-08-08 DIAGNOSIS — Z79899 Other long term (current) drug therapy: Secondary | ICD-10-CM | POA: Insufficient documentation

## 2017-08-08 DIAGNOSIS — F1991 Other psychoactive substance use, unspecified, in remission: Secondary | ICD-10-CM

## 2017-08-08 DIAGNOSIS — O4702 False labor before 37 completed weeks of gestation, second trimester: Secondary | ICD-10-CM | POA: Diagnosis present

## 2017-08-08 DIAGNOSIS — O409XX0 Polyhydramnios, unspecified trimester, not applicable or unspecified: Secondary | ICD-10-CM

## 2017-08-08 DIAGNOSIS — O99342 Other mental disorders complicating pregnancy, second trimester: Secondary | ICD-10-CM | POA: Diagnosis not present

## 2017-08-08 LAB — URINE DRUG SCREEN, QUALITATIVE (ARMC ONLY)
AMPHETAMINES, UR SCREEN: NOT DETECTED
BENZODIAZEPINE, UR SCRN: NOT DETECTED
Barbiturates, Ur Screen: NOT DETECTED
Cannabinoid 50 Ng, Ur ~~LOC~~: POSITIVE — AB
Cocaine Metabolite,Ur ~~LOC~~: NOT DETECTED
MDMA (Ecstasy)Ur Screen: NOT DETECTED
Methadone Scn, Ur: NOT DETECTED
Opiate, Ur Screen: NOT DETECTED
PHENCYCLIDINE (PCP) UR S: NOT DETECTED
Tricyclic, Ur Screen: NOT DETECTED

## 2017-08-08 LAB — URINALYSIS, ROUTINE W REFLEX MICROSCOPIC
BILIRUBIN URINE: NEGATIVE
GLUCOSE, UA: NEGATIVE mg/dL
Hgb urine dipstick: NEGATIVE
KETONES UR: NEGATIVE mg/dL
Leukocytes, UA: NEGATIVE
Nitrite: POSITIVE — AB
PROTEIN: NEGATIVE mg/dL
Specific Gravity, Urine: 1.021 (ref 1.005–1.030)
pH: 6 (ref 5.0–8.0)

## 2017-08-08 LAB — FETAL FIBRONECTIN: Fetal Fibronectin: NEGATIVE

## 2017-08-08 MED ORDER — DEXTROSE IN LACTATED RINGERS 5 % IV SOLN
INTRAVENOUS | Status: DC
  Administered 2017-08-08: 10:00:00 via INTRAVENOUS

## 2017-08-08 MED ORDER — SODIUM CHLORIDE 0.9 % IV SOLN
2.0000 g | Freq: Once | INTRAVENOUS | Status: AC
  Administered 2017-08-08: 2 g via INTRAVENOUS
  Filled 2017-08-08: qty 2000

## 2017-08-08 MED ORDER — ACETAMINOPHEN 325 MG PO TABS: 650.0000 mg | ORAL_TABLET | ORAL | Status: DC | PRN

## 2017-08-08 MED ORDER — CALCIUM CARBONATE ANTACID 500 MG PO CHEW: 2.0000 | CHEWABLE_TABLET | ORAL | Status: DC | PRN

## 2017-08-08 MED ORDER — BETAMETHASONE SOD PHOS & ACET 6 (3-3) MG/ML IJ SUSP
12.0000 mg | INTRAMUSCULAR | Status: DC
  Administered 2017-08-08: 12 mg via INTRAMUSCULAR

## 2017-08-08 MED ORDER — BETAMETHASONE SOD PHOS & ACET 6 (3-3) MG/ML IJ SUSP
INTRAMUSCULAR | Status: AC
  Administered 2017-08-08: 12 mg via INTRAMUSCULAR
  Filled 2017-08-08: qty 1

## 2017-08-08 NOTE — OB Triage Note (Signed)
Case manager, [name] called to see if there was assistance getting her abx for UTI. Pt reports not being able to afford abx. Application given for [facility] and a new Rx for Keflex given. Instructed to be there before close @ 1600. Casey Owens, Casey Owens

## 2017-08-08 NOTE — H&P (Signed)
Obstetric History and Physical  Casey Owens is a 27 y.o. [id number] with IUP at [redacted]w[redacted]d presenting for contractions. Patient states she has been having  irregular, every ? minutes contractions, none vaginal bleeding, intact membranes, with Regular fetal movement.    Patient states that her contractions began at work at 1800 last night.  She denies fevers chills or sweats.  She denies nausea vomiting or diarrhea.  She denies flank pain. 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    History of multiple preterm deliveries at 34 weeks; patient declines 66 OHP therapy   Hydramnios on last maternal fetal medicine ultrasound at 24 weeks; Glucola recommended but not obtained  Short cervical length 2.5 cm on last maternal fetal medicine ultrasound; vaginal progesterone recommended but not initiated  ASB; positive cultures since December 2018, not treated due to noncompliance and patient report of inability to pay for medication   Prenatal Course Source of Care: [facility] Pregnancy complications or risks: Patient Active Problem List   Diagnosis Date Noted  . Preterm uterine contractions in second trimester, antepartum 08/08/2017  . Polyhydramnios affecting pregnancy 07/30/2017  . Cervical insufficiency during pregnancy, antepartum 07/30/2017  . Asymptomatic bacteriuria during pregnancy in second trimester 05/27/2017  . History of drug use 04/29/2017  . Previous preterm delivery, antepartum 04/29/2017  . Supervision of high-risk pregnancy 04/28/2017  . Tobacco use in pregnancy 02/10/2015   Prenatal labs and studies: ABO, Rh: O/Positive/-- (12/04 1520) Antibody: Negative (12/04 1520) Rubella: 1.76 (12/04 1520) RPR: Non Reactive (12/04 1520)  HBsAg: Negative (12/04 1520)  HIV: Non Reactive (12/04 1520)  GBS: Done today 1 hr Glucola not obtained Genetic screening normal Anatomy US normal;  hydramnios with maximal vertical pocket 8.8 cm by MFM ultrasound  Past Medical History:  Diagnosis Date  . Asthma   . Depression   . 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 NO PAST SURGERIES      OB History  Gravida Para Term Preterm AB Living  5 4 1 3   4   SAB TAB Ectopic Multiple Live Births        0 4    # Outcome Date GA Lbr Len/2nd Weight Sex Delivery Anes PTL Lv  5 Current           4 Preterm 08/16/16 [redacted]w[redacted]d 814:40 / 00:15 4 lb 2 oz (1.87 kg) F Vag-Spont EPI  LIV  3 Preterm 03/24/15 [redacted]w[redacted]d 04:46 / 00:02 4 lb 5 oz (1.956 kg) F Vag-Spont   LIV  2 Preterm 06/20/11 [redacted]w[redacted]d  4 lb 6 oz (1.984 kg) M Vag-Spont None Y LIV     Complications: Preterm labor in second trimester with preterm delivery  1 Term 05/08/08 106w0d  5 lb 4 oz (2.381 kg) F  None Y LIV      Social History   Socioeconomic History  . Marital status: Single    Spouse name: None  . Number of children: None  . Years of education: None  . Highest education level: None  Social Needs  . Financial resource strain: None  . Food insecurity - worry: None  . Food insecurity - inability: None  . Transportation needs - medical: None  . Transportation needs - non-medical: None  Occupational History  . None  Tobacco Use  . Smoking status: Current Every Day Smoker  Packs/day: 0.25    Years: 10.00    Pack years: 2.50    Types: Cigarettes  . Smokeless tobacco: Never Used  Substance and Sexual Activity  . Alcohol use: No  . Drug use: No    Comment: says quit drug use in January  . Sexual activity: No    Birth control/protection: Other-see comments    Comment: tubal ligation  Other Topics Concern  . None  Social History Narrative  . None    Family History  Problem Relation Age of Onset  . Hypertension Mother   . Arthritis Paternal Aunt   . Hypertension Maternal Grandmother   . Arthritis Paternal  Grandmother     Medications Prior to Admission  Medication Sig Dispense Refill Last Dose  . Prenatal Vit-Fe Fumarate-FA (PRENATAL MULTIVITAMIN) TABS tablet Take 1 tablet by mouth daily at 12 noon.   08/07/2017 at Unknown time  . ondansetron (ZOFRAN-ODT) 4 MG disintegrating tablet Take 1 tablet (4 mg total) by mouth every 6 (six) hours as needed for nausea or vomiting. (Patient not taking: Reported on 08/08/2017) 20 tablet 0 Not Taking at Unknown time  . progesterone (PROMETRIUM) 200 MG capsule Place one capsule vaginally at bedtime (Patient not taking: Reported on 08/08/2017) 30 capsule 3 Not Taking at Unknown time    Allergies  Allergen Reactions  . Shellfish Allergy Swelling    Review of Systems: Negative except for what is mentioned in HPI.  Physical Exam: Temp 97.9 F (36.6 C) (Oral)   Resp 16   Ht 5\' 4"  (1.626 m)   Wt 150 lb (68 kg)   LMP 11/14/2016   BMI 25.75 kg/m  CONSTITUTIONAL: Well-developed, well-nourished female in no acute distress.  Patient appears slightly uncomfortable and depressed with decreased affect HENT:  Normocephalic, atraumatic EYES: Conjunctivae and EOM are normal.  No scleral icterus.  NECK: Normal range of motion, supple, no masses SKIN: Skin is warm and dry. No rash noted. Not diaphoretic. No erythema. No pallor. NEUROLGIC: Alert and oriented to person, place, and time. Normal reflexes, muscle tone coordination. No cranial nerve deficit noted. PSYCHIATRIC: Normal mood and affect. Normal behavior. Normal judgment and thought content. CARDIOVASCULAR: Normal heart rate noted, regular rhythm RESPIRATORY: Effort and breath sounds normal, no problems with respiration noted ABDOMEN: Soft, nontender, nondistended, gravid.  Fetal small parts cannot be appreciated due to hydramnios; fundal height 32 cm; uterus nontender; suprapubic tenderness is present MUSCULOSKELETAL: Normal range of motion. No edema and no tenderness. 2+ distal pulses.  Cervical Exam:  30%/soft/internal os closed (external os fingertip)/high/bag of water intact Presentation: Uncertain FHT:  Baseline rate normal; unable to maintain continuous electronic fetal monitoring due to fetal movement and hydramnios Contractions: Irregular every 6-12 minutes   Pertinent Labs/Studies:   No results found for this or any previous visit (from the past 24 hour(s)).  Assessment : Casey Owens is a 27 y.o. [phone] at [redacted]w[redacted]d being admitted for preterm labor management.  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  IV fluids 2.  GBS culture; UA C&S; UDS 3.  Fetal fibronectin 4.  Ampicillin 2 g IV 5.  Betamethasone 12.5 mg IM every 24 hours x2 doses 6.  Patient has been counseled regarding importance of getting follow-up at [facility] for acute issues at least until [redacted] weeks gestation because of the limitations of our NICU being able to care for pre-30-week  infants.  Bucky Grigg A. Beatris Sie Francesco, MD, FACOG [facility]

## 2017-08-08 NOTE — Discharge Instructions (Signed)
Asymptomatic Bacteriuria Asymptomatic bacteriuria is the presence of a large number of bacteria in the urine without the usual symptoms of burning or frequent urination. What are the causes? This condition is caused by an increase in bacteria in the urine. This increase can be caused by:  Bacteria entering the urinary tract, such as during sex.  A blockage in the urinary tract, such as from kidney stones or a tumor.  Bladder problems that prevent the bladder from emptying.  What increases the risk? You are more likely to develop this condition if:  You have diabetes mellitus.  You are an elderly adult, especially if you are also in a long-term care facility.  You are pregnant and in the first trimester.  You have kidney stones.  You are female.  You have had a kidney transplant.  You have a leaky kidney tube valve (reflux).  You had a urinary catheter for a long period of time.  What are the signs or symptoms? There are no symptoms of this condition. How is this diagnosed? This condition is diagnosed with a urine test. Because this condition does not cause symptoms, it is usually diagnosed when a urine sample is taken to treat or diagnose another condition, such as pregnancy or kidney problems. Most women who are in their first trimester of pregnancy are screened for asymptomatic bacteriuria. How is this treated? Usually, treatment is not needed for this condition. Treating the condition can lead to other problems, such as a yeast infection or the growth of bacteria that do not respond to treatment (antibiotic-resistant bacteria). Some people, such as pregnant women and people with kidney transplants, do need treatment with antibiotic medicines to prevent kidney infection (pyelonephritis). In pregnant women, kidney infection can lead to premature labor, fetal growth restriction, or newborn death. Follow these instructions at home: Medicines  Take over-the-counter and  prescription medicines only as told by your health care provider.  If you were prescribed an antibiotic medicine, take it as told by your health care provider. Do not stop taking the antibiotic even if you start to feel better. General instructions  Monitor your condition for any changes.  Drink enough fluid to keep your urine clear or pale yellow.  Go to the bathroom more often to keep your bladder empty.  If you are female, keep the area around your vagina and rectum clean. Wipe yourself from front to back after urinating.  Keep all follow-up visits as told by your health care provider. This is important. Contact a health care provider if:  You notice any new symptoms, such as back pain or burning while urinating. Get help right away if:  You develop signs of an infection such as: ? A burning sensation when you urinate. ? Have pain when you urinate. ? Develop an intense need to urinate. ? Urinating more frequently. ? Back pain or pelvic pain. ? Fever or chills.  You have blood in your urine.  Your urine becomes discolored or cloudy.  Your urine smells bad.  You have severe pain that cannot be controlled with medicine. Summary  Asymptomatic bacteriuria is the presence of a large number of bacteria in the urine without the usual symptoms of burning or frequent urination.  Usually, treatment is not needed for this condition. Treating the condition can lead to other problems, such as too much yeast and the growth of antibiotic-resistant bacteria.  Some people, such as pregnant women and people with kidney transplants, do need treatment with antibiotic medicines to prevent   kidney infection (pyelonephritis).  If you were prescribed an antibiotic medicine, take it as told by your health care provider. Do not stop taking the antibiotic even if you start to feel better. This information is not intended to replace advice given to you by your health care provider. Make sure you  discuss any questions you have with your health care provider. Document Released: 05/13/2005 Document Revised: 05/07/2016 Document Reviewed: 05/07/2016 Elsevier Interactive Patient Education  2017 [employer] Preterm Labor and Birth Information Pregnancy normally lasts 39-41 weeks. Preterm labor is when labor starts early. It starts before you have been pregnant for 37 whole weeks. What are the risk factors for preterm labor? Preterm labor is more likely to occur in women who:  Have an infection while pregnant.  Have a cervix that is short.  Have gone into preterm labor before.  Have had surgery on their cervix.  Are younger than age 29.  Are older than age 29.  Are African American.  Are pregnant with two or more babies.  Take street drugs while pregnant.  Smoke while pregnant.  Do not gain enough weight while pregnant.  Got pregnant right after another pregnancy.  What are the symptoms of preterm labor? Symptoms of preterm labor include:  Cramps. The cramps may feel like the cramps some women get during their period. The cramps may happen with watery poop (diarrhea).  Pain in the belly (abdomen).  Pain in the lower back.  Regular contractions or tightening. It may feel like your belly is getting tighter.  Pressure in the lower belly that seems to get stronger.  More fluid (discharge) leaking from the vagina. The fluid may be watery or bloody.  Water breaking.  Why is it important to notice signs of preterm labor? Babies who are born early may not be fully developed. They have a higher chance for:  Long-term heart problems.  Long-term lung problems.  Trouble controlling body systems, like breathing.  Bleeding in the brain.  A condition called cerebral palsy.  Learning difficulties.  Death.  These risks are highest for babies who are born before 34 weeks of pregnancy. How is preterm labor treated? Treatment depends on:  How long you were  pregnant.  Your condition.  The health of your baby.  Treatment may involve:  Having a stitch (suture) placed in your cervix. When you give birth, your cervix opens so the baby can come out. The stitch keeps the cervix from opening too soon.  Staying at the hospital.  Taking or getting medicines, such as: ? Hormone medicines. ? Medicines to stop contractions. ? Medicines to help the babys lungs develop. ? Medicines to prevent your baby from having cerebral palsy.  What should I do if I am in preterm labor? If you think you are going into labor too soon, call your doctor right away. How can I prevent preterm labor?  Do not use any tobacco products. ? Examples of these are cigarettes, chewing tobacco, and e-cigarettes. ? If you need help quitting, ask your doctor.  Do not use street drugs.  Do not use any medicines unless you ask your doctor if they are safe for you.  Talk with your doctor before taking any herbal supplements.  Make sure you gain enough weight.  Watch for infection. If you think you might have an infection, get it checked right away.  If you have gone into preterm labor before, tell your doctor. This information is not intended to replace advice given to  you by your health care provider. Make sure you discuss any questions you have with your health care provider. Document Released: 08/09/2008 Document Revised: 10/24/2015 Document Reviewed: 10/04/2015 Elsevier Interactive Patient Education  2018 [employer].

## 2017-08-08 NOTE — OB Triage Note (Signed)
Ctx since 1800 yesterday. Was at work when ctx started. Was unable to seek care until this am due to work. Has a history of preterm deliveries x 3. Denies LOF, vaginal bleeding. Reports having a UTI diagnosed x 2 weeks ago. Unable to pay for medication. No meds have been taken for treatment. Next appt. Is March 20th.  Elaina HoopsElks, Carmelina Balducci S

## 2017-08-08 NOTE — Final Progress Note (Signed)
Physician Final Progress Note  Patient ID: Casey Owens MRN: [id number] DOB/AGE: Nov 26, 1990 27 y.o.  Admit date: 08/08/2017 Admitting provider: Herold HarmsMartin A Defrancesco, MD Discharge date: 08/08/2017   Admission Diagnoses: 1.  26.3-week intrauterine pregnancy, undelivered 2.  Preterm contractions 3.  Hydramnios 4.  Short cervical length 5.  Tobacco user 6.  Substance user-THC/cocaine 7.  History of multiple preterm births 8.  E. coli UTI  Discharge Diagnoses:  1.  26.3-week intrauterine pregnancy, undelivered 2.  Preterm contractions, resolved 3.  Hydramnios 4.  Short cervical length 5.  Tobacco user 6.  Substance user-UDS positive for THC today (history of THC/cocaine) 7.  History of multiple preterm births 8.  ASB since 04/2017; UTI treated  Consults: None  Significant Findings/ Diagnostic Studies:  Urinalysis-positive nitrites UDS-positive THC GBS-pending Fetal fibronectin-negative  Procedures: 1.IV fluids with IV ampicillin 2 g with subsequent resolution of contractions/uterine irritability. 2.  Betamethasone 12.5 mg IM; return in 24 hours for second injection  Discharge Condition: stable  Disposition: 01-Home or Self Care  Diet: Regular diet  Discharge Activity: Activity as tolerated; patient is to keep regular OB [facility] as scheduled; patient is strongly [facility]; patient strongly encouraged to access the pregnancy home Medicaid coordinator to help with facilitating care following provider recommendations.  Patient is to return here in 24 hours for second betamethasone injection.  Patient is strongly encouraged to obtain Keflex prescription as previously prescribed to complete a 7-day course of antibiotics for ASB/UTI   Allergies as of 08/08/2017      Reactions   Shellfish Allergy Swelling      Medication List    TAKE these medications   ondansetron 4 MG  disintegrating tablet Commonly known as:  ZOFRAN-ODT Take 1 tablet (4 mg total) by mouth every 6 (six) hours as needed for nausea or vomiting.   prenatal multivitamin Tabs tablet Take 1 tablet by mouth daily at 12 noon.   progesterone 200 MG capsule Commonly known as:  PROMETRIUM Place one capsule vaginally at bedtime        Total time spent taking care of this patient: 30 minutes  Signed: Prentice DockerMartin A Defrancesco 08/08/2017, 12:46 PM

## 2017-08-09 ENCOUNTER — Inpatient Hospital Stay
Admission: RE | Admit: 2017-08-09 | Discharge: 2017-08-09 | Disposition: A | Discharge status: Home / Self Care | Payer: Medicaid Other | Attending: Obstetrics and Gynecology | Admitting: Obstetrics and Gynecology

## 2017-08-09 DIAGNOSIS — O4702 False labor before 37 completed weeks of gestation, second trimester: Secondary | ICD-10-CM | POA: Insufficient documentation

## 2017-08-09 MED ORDER — BETAMETHASONE SOD PHOS & ACET 6 (3-3) MG/ML IJ SUSP
12.0000 mg | Freq: Once | INTRAMUSCULAR | Status: AC
  Administered 2017-08-09: 12 mg via INTRAMUSCULAR

## 2017-08-10 LAB — CULTURE, BETA STREP (GROUP B ONLY)

## 2017-08-10 LAB — URINE CULTURE: Culture: >=100000 — AB

## 2017-08-13 ENCOUNTER — Ambulatory Visit (INDEPENDENT_AMBULATORY_CARE_PROVIDER_SITE_OTHER): Payer: Self-pay | Admitting: Family Medicine

## 2017-08-13 VITALS — BP 118/72 | HR 72 | Wt 151.1 lb

## 2017-08-13 DIAGNOSIS — O09219 Supervision of pregnancy with history of pre-term labor, unspecified trimester: Secondary | ICD-10-CM

## 2017-08-13 DIAGNOSIS — O409XX0 Polyhydramnios, unspecified trimester, not applicable or unspecified: Secondary | ICD-10-CM

## 2017-08-13 DIAGNOSIS — Z23 Encounter for immunization: Secondary | ICD-10-CM

## 2017-08-13 DIAGNOSIS — O99333 Smoking (tobacco) complicating pregnancy, third trimester: Secondary | ICD-10-CM

## 2017-08-13 DIAGNOSIS — O2343 Unspecified infection of urinary tract in pregnancy, third trimester: Secondary | ICD-10-CM

## 2017-08-13 DIAGNOSIS — O0992 Supervision of high risk pregnancy, unspecified, second trimester: Secondary | ICD-10-CM

## 2017-08-13 DIAGNOSIS — O3430 Maternal care for cervical incompetence, unspecified trimester: Secondary | ICD-10-CM

## 2017-08-13 MED ORDER — CEPHALEXIN 500 MG PO CAPS: 500.0000 mg | ORAL_CAPSULE | Freq: Three times a day (TID) | ORAL | 0 refills | Status: DC

## 2017-08-13 NOTE — Progress Notes (Signed)
tdap today Can not afford to get medications See note for [facility]

## 2017-08-13 NOTE — Patient Instructions (Signed)

## 2017-08-13 NOTE — Progress Notes (Signed)
PRENATAL VISIT NOTE  Subjective:  Casey Owens is a 27 y.o. [phone] at [redacted]w[redacted]d being seen today for ongoing prenatal care.  She is currently monitored for the following issues for this high-risk pregnancy and has Tobacco use in pregnancy; Supervision of high-risk pregnancy; History of drug use; Previous preterm delivery, antepartum; Asymptomatic bacteriuria during pregnancy in second trimester; Polyhydramnios affecting pregnancy; Cervical insufficiency during pregnancy, antepartum; and Preterm uterine contractions in second trimester, antepartum on their problem list.  Patient reports contractions since 3/15-- now very intermittent, worse with walking around, prolonged standing. Reports most frequent is about every 20 minutes but not increasing intensity. No currently having ctx. Reports she received 2 steroid shots.  Contractions: Not present.  .  Movement: Present. Denies leaking of fluid.   The following portions of the patient's history were reviewed and updated as appropriate: allergies, current medications, past family history, past medical history, past social history, past surgical history and problem list. Problem list updated.  Objective:   Vitals:   08/13/17 0856  BP: 118/72  Pulse: 72  Weight: 151 lb 1.6 oz (68.5 kg)    Fetal Status: Fetal Heart Rate (bpm): 145 Fundal Height: 30 cm Movement: Present     General:  Alert, oriented and cooperative. Patient is in no acute distress.  Skin: Skin is warm and dry. No rash noted.   Cardiovascular: Normal heart rate noted  Respiratory: Normal respiratory effort, no problems with respiration noted  Abdomen: Soft, gravid, appropriate for gestational age.  Pain/Pressure: Absent     Pelvic: Cervical exam deferred        Extremities: Normal range of motion.     Mental Status:  Normal mood and affect. Normal behavior. Normal judgment and thought content.   Assessment and Plan:  Pregnancy: [id number] at [redacted]w[redacted]d  1. Supervision of high  risk pregnancy in second trimester - discussed adopt-a-mom BTL-- patient interested in starting a payment plan for tubal sterilization as she has not yet filed for [employer].  - OBCM is meeting with client today in the office  - Discussed need to go to [facility] for any labor concerns given preterm status and NICU at [facility] is only for infants 30w or greater, additionally discussed that her OB providers [facility]. She voiced understanding.  - Glucose Tolerance, 2 Hours w/1 Hour - CBC - RPR - HIV antibody  2. Urinary tract infection in mother during third trimester of pregnancy - UCx showed pan sensitive ecoli, received ampicllin x1 for GBS ppx when thought to be in labor.  - Given good RX coupon for [facility] (rx is 6.76 at this facility) - cephALEXin (KEFLEX) 500 MG capsule; Take 1 capsule (500 mg total) by mouth 3 (three) times daily.  Dispense: 21 capsule; Refill: 0  3. Cervical insufficiency during pregnancy, antepartum - was unable to afford vaginall progesterone - Received BMZ 3/15 and 3/16  4. Maternal tobacco use in third trimester Cut back to 3 cigs per day Does not smoke MJ but has been around those that do  5. Previous preterm delivery, antepartum Declined 17 P Cannot afford vaginal progesterone  6. Polyhydramnios affecting pregnancy F/u US ordered  Measuring S>D currently. Discussed with patient need to f/u AFI. She currently has no insurance coverage.    Preterm labor symptoms and general obstetric precautions including but not limited to vaginal bleeding, contractions, leaking of fluid and fetal movement were reviewed in detail with the patient. Please refer to After Visit Summary for other counseling  recommendations.  Return in about 2 weeks (around 08/27/2017) for Routine prenatal care.   Federico FlakeKimberly Niles Newton, MD

## 2017-08-14 LAB — CBC
Hematocrit: 33.2 % — ABNORMAL LOW (ref 34.0–46.6)
Hemoglobin: 11.3 g/dL (ref 11.1–15.9)
MCH: 32.8 pg (ref 26.6–33.0)
MCHC: 34 g/dL (ref 31.5–35.7)
MCV: 97 fL (ref 79–97)
PLATELETS: 293 10*3/uL (ref 150–379)
RBC: 3.44 x10E6/uL — AB (ref 3.77–5.28)
RDW: 13.2 % (ref 12.3–15.4)
WBC: 14.8 10*3/uL — AB (ref 3.4–10.8)

## 2017-08-14 LAB — RPR: RPR Ser Ql: NONREACTIVE

## 2017-08-14 LAB — GLUCOSE TOLERANCE, 2 HOURS W/ 1HR
GLUCOSE, 2 HOUR: 90 mg/dL (ref 65–152)
GLUCOSE, FASTING: 71 mg/dL (ref 65–91)
Glucose, 1 hour: 94 mg/dL (ref 65–179)

## 2017-08-14 LAB — HIV ANTIBODY (ROUTINE TESTING W REFLEX): HIV SCREEN 4TH GENERATION: NONREACTIVE

## 2017-08-27 ENCOUNTER — Ambulatory Visit (INDEPENDENT_AMBULATORY_CARE_PROVIDER_SITE_OTHER): Payer: Self-pay | Admitting: Family Medicine

## 2017-08-27 VITALS — BP 109/72 | HR 72 | Wt 151.0 lb

## 2017-08-27 DIAGNOSIS — O403XX0 Polyhydramnios, third trimester, not applicable or unspecified: Secondary | ICD-10-CM

## 2017-08-27 DIAGNOSIS — O99333 Smoking (tobacco) complicating pregnancy, third trimester: Secondary | ICD-10-CM | POA: Diagnosis not present

## 2017-08-27 DIAGNOSIS — O09213 Supervision of pregnancy with history of pre-term labor, third trimester: Secondary | ICD-10-CM | POA: Diagnosis not present

## 2017-08-27 DIAGNOSIS — O0993 Supervision of high risk pregnancy, unspecified, third trimester: Secondary | ICD-10-CM

## 2017-08-27 DIAGNOSIS — O479 False labor, unspecified: Secondary | ICD-10-CM

## 2017-08-27 DIAGNOSIS — O3430 Maternal care for cervical incompetence, unspecified trimester: Secondary | ICD-10-CM

## 2017-08-27 DIAGNOSIS — O4703 False labor before 37 completed weeks of gestation, third trimester: Secondary | ICD-10-CM

## 2017-08-27 DIAGNOSIS — O4700 False labor before 37 completed weeks of gestation, unspecified trimester: Secondary | ICD-10-CM

## 2017-08-27 DIAGNOSIS — O09219 Supervision of pregnancy with history of pre-term labor, unspecified trimester: Secondary | ICD-10-CM

## 2017-08-27 DIAGNOSIS — F1991 Other psychoactive substance use, unspecified, in remission: Secondary | ICD-10-CM

## 2017-08-27 DIAGNOSIS — O3433 Maternal care for cervical incompetence, third trimester: Secondary | ICD-10-CM

## 2017-08-27 DIAGNOSIS — O409XX0 Polyhydramnios, unspecified trimester, not applicable or unspecified: Secondary | ICD-10-CM

## 2017-08-27 DIAGNOSIS — O99330 Smoking (tobacco) complicating pregnancy, unspecified trimester: Secondary | ICD-10-CM

## 2017-08-27 DIAGNOSIS — Z87898 Personal history of other specified conditions: Secondary | ICD-10-CM

## 2017-08-27 DIAGNOSIS — O0992 Supervision of high risk pregnancy, unspecified, second trimester: Secondary | ICD-10-CM

## 2017-08-27 NOTE — Progress Notes (Signed)
   PRENATAL VISIT NOTE  Subjective:  Casey Owens is a 27 y.o. [phone] at 4954w1d being seen today for ongoing prenatal care.  She is currently monitored for the following issues for this high-risk pregnancy and has Tobacco use in pregnancy; Supervision of high-risk pregnancy; History of drug use; Previous preterm delivery, antepartum; Asymptomatic bacteriuria during pregnancy in second trimester; Polyhydramnios affecting pregnancy; Cervical insufficiency during pregnancy, antepartum; and Preterm uterine contractions in second trimester, antepartum on their problem list.  Patient reports backache and contractions since 3d ago. Reports SOB- using son's inhaler. Patient had a history of asthma and was on pulmicort and albuterol previously but cannot get rx filled.  Contractions: Irritability. Vag. Bleeding: None.  Movement: Present. Denies leaking of fluid.   The following portions of the patient's history were reviewed and updated as appropriate: allergies, current medications, past family history, past medical history, past social history, past surgical history and problem list. Problem list updated.  Objective:   Vitals:   08/27/17 1010  BP: 109/72  Pulse: 72  Weight: 151 lb (68.5 kg)    Fetal Status: Fetal Heart Rate (bpm): NST   Movement: Present     General:  Alert, oriented and cooperative. Patient is in no acute distress.  Skin: Skin is warm and dry. No rash noted.   Cardiovascular: Normal heart rate noted  Respiratory: Normal respiratory effort, no problems with respiration noted  Abdomen: Soft, gravid, appropriate for gestational age.  Pain/Pressure: Present     Pelvic: Cervical exam deferred        Extremities: Normal range of motion.     Mental Status: Normal mood and affect. Normal behavior. Normal judgment and thought content.   Assessment and Plan:  Pregnancy: [id number] at 3154w1d  1. Supervision of high risk pregnancy in second trimester UTD except for declined  services/testing Social stressors-- Patient was fired, Partner/FOB is incarcerated, has not applied for medicaid, OBCM discharged patient due to lack of medicaid. Used motivational interviewing to help patient troubleshoot options to get medicaid application to [place].   2. Cervical insufficiency during pregnancy, antepartum Unable to afford progesterone Having "cramping"/"contractions"-- non on monitor. NST reassuring  3. Tobacco use during pregnancy, antepartum Continues to smoke cigarettes  4. Previous preterm delivery, antepartum Declined 17 P Has OBCM but "case was closed" due to her lack of medicaid  5. Polyhydramnios affecting pregnancy Mild at anatomy scan (MVP 8.3)  Needs repeat US but does not have any insurance currently  6. History of drug use - Recommend UDS at admission  7. Asthma - offered rx for albuterol and inhaled corticosteroid but patient declined   Preterm labor symptoms and general obstetric precautions including but not limited to vaginal bleeding, contractions, leaking of fluid and fetal movement were reviewed in detail with the patient. Please refer to After Visit Summary for other counseling recommendations.   Return in about 2 weeks (around 09/10/2017) for Routine prenatal care.  Future Appointments  Date Time Provider Department Center  09/10/2017 10:00 AM Federico FlakeNewton, Myeesha Shane Niles, MD [facility]    Federico FlakeKimberly Niles Oasis Goehring, MD

## 2017-08-27 NOTE — Progress Notes (Signed)
C/o short of breath, contractions, abdominal pain started 3 days ago.

## 2017-08-27 NOTE — Progress Notes (Signed)
Patient reports not being able to get medications due to finances. She reports having a hard time breathing. She is sharing her  son inhaler when she feels SOB.

## 2017-09-10 ENCOUNTER — Encounter: Payer: Self-pay | Admitting: Family Medicine

## 2017-09-21 ENCOUNTER — Encounter: Payer: Self-pay | Admitting: *Deleted

## 2017-09-21 ENCOUNTER — Other Ambulatory Visit: Payer: Self-pay

## 2017-09-21 ENCOUNTER — Observation Stay
Admission: EM | Admit: 2017-09-21 | Discharge: 2017-09-21 | Disposition: A | Discharge status: Home / Self Care | Payer: Medicaid Other | Attending: Obstetrics and Gynecology | Admitting: Obstetrics and Gynecology

## 2017-09-21 DIAGNOSIS — Z87898 Personal history of other specified conditions: Secondary | ICD-10-CM

## 2017-09-21 DIAGNOSIS — Z3A32 32 weeks gestation of pregnancy: Secondary | ICD-10-CM | POA: Diagnosis present

## 2017-09-21 DIAGNOSIS — O409XX0 Polyhydramnios, unspecified trimester, not applicable or unspecified: Secondary | ICD-10-CM

## 2017-09-21 DIAGNOSIS — F1991 Other psychoactive substance use, unspecified, in remission: Secondary | ICD-10-CM

## 2017-09-21 DIAGNOSIS — O4703 False labor before 37 completed weeks of gestation, third trimester: Secondary | ICD-10-CM | POA: Diagnosis not present

## 2017-09-21 DIAGNOSIS — O3430 Maternal care for cervical incompetence, unspecified trimester: Secondary | ICD-10-CM

## 2017-09-21 LAB — URINALYSIS, ROUTINE W REFLEX MICROSCOPIC
Bacteria, UA: NONE SEEN
Bilirubin Urine: NEGATIVE
Glucose, UA: NEGATIVE mg/dL
Hgb urine dipstick: NEGATIVE
Ketones, ur: 80 mg/dL — AB
Leukocytes, UA: NEGATIVE
Nitrite: NEGATIVE
Protein, ur: 30 mg/dL — AB
Specific Gravity, Urine: 1.026 (ref 1.005–1.030)
pH: 6 (ref 5.0–8.0)

## 2017-09-21 LAB — URINE DRUG SCREEN, QUALITATIVE (ARMC ONLY)
AMPHETAMINES, UR SCREEN: NOT DETECTED
Barbiturates, Ur Screen: NOT DETECTED
Benzodiazepine, Ur Scrn: NOT DETECTED
COCAINE METABOLITE, UR ~~LOC~~: NOT DETECTED
Cannabinoid 50 Ng, Ur ~~LOC~~: POSITIVE — AB
MDMA (ECSTASY) UR SCREEN: NOT DETECTED
METHADONE SCREEN, URINE: NOT DETECTED
OPIATE, UR SCREEN: NOT DETECTED
PHENCYCLIDINE (PCP) UR S: NOT DETECTED
Tricyclic, Ur Screen: NOT DETECTED

## 2017-09-21 NOTE — Discharge Instructions (Signed)
Preterm Labor and Birth Information Pregnancy normally lasts 39-41 weeks. Preterm labor is when labor starts early. It starts before you have been pregnant for 37 whole weeks. What are the risk factors for preterm labor? Preterm labor is more likely to occur in women who:  Have an infection while pregnant.  Have a cervix that is short.  Have gone into preterm labor before.  Have had surgery on their cervix.  Are younger than age 29.  Are older than age 6.  Are African American.  Are pregnant with two or more babies.  Take street drugs while pregnant.  Smoke while pregnant.  Do not gain enough weight while pregnant.  Got pregnant right after another pregnancy.  What are the symptoms of preterm labor? Symptoms of preterm labor include:  Cramps. The cramps may feel like the cramps some women get during their period. The cramps may happen with watery poop (diarrhea).  Pain in the belly (abdomen).  Pain in the lower back.  Regular contractions or tightening. It may feel like your belly is getting tighter.  Pressure in the lower belly that seems to get stronger.  More fluid (discharge) leaking from the vagina. The fluid may be watery or bloody.  Water breaking.  Why is it important to notice signs of preterm labor? Babies who are born early may not be fully developed. They have a higher chance for:  Long-term heart problems.  Long-term lung problems.  Trouble controlling body systems, like breathing.  Bleeding in the brain.  A condition called cerebral palsy.  Learning difficulties.  Death.  These risks are highest for babies who are born before 34 weeks of pregnancy. How is preterm labor treated? Treatment depends on:  How long you were pregnant.  Your condition.  The health of your baby.  Treatment may involve:  Having a stitch (suture) placed in your cervix. When you give birth, your cervix opens so the baby can come out. The stitch keeps the  cervix from opening too soon.  Staying at the hospital.  Taking or getting medicines, such as: ? Hormone medicines. ? Medicines to stop contractions. ? Medicines to help the babys lungs develop. ? Medicines to prevent your baby from having cerebral palsy.  What should I do if I am in preterm labor? If you think you are going into labor too soon, call your doctor right away. How can I prevent preterm labor?  Do not use any tobacco products. ? Examples of these are cigarettes, chewing tobacco, and e-cigarettes. ? If you need help quitting, ask your doctor.  Do not use street drugs.  Do not use any medicines unless you ask your doctor if they are safe for you.  Talk with your doctor before taking any herbal supplements.  Make sure you gain enough weight.  Watch for infection. If you think you might have an infection, get it checked right away.  If you have gone into preterm labor before, tell your doctor. This information is not intended to replace advice given to you by your health care provider. Make sure you discuss any questions you have with your health care provider. Document Released: 08/09/2008 Document Revised: 10/24/2015 Document Reviewed: 10/04/2015 Elsevier Interactive Patient Education  2018 [employer]   Dehydration, Adult Dehydration is when there is not enough fluid or water in your body. This happens when you lose more fluids than you take in. Dehydration can range from mild to very bad. It should be treated right away to keep  it from getting very bad. Symptoms of mild dehydration may include:  Thirst.  Dry lips.  Slightly dry mouth.  Dry, warm skin.  Dizziness. Symptoms of moderate dehydration may include:  Very dry mouth.  Muscle cramps.  Dark pee (urine). Pee may be the color of tea.  Your body making less pee.  Your eyes making fewer tears.  Heartbeat that is uneven or faster than normal (palpitations).  Headache.  Light-headedness,  especially when you stand up from sitting.  Fainting (syncope). Symptoms of very bad dehydration may include:  Changes in skin, such as: ? Cold and clammy skin. ? Blotchy (mottled) or pale skin. ? Skin that does not quickly return to normal after being lightly pinched and let go (poor skin turgor).  Changes in body fluids, such as: ? Feeling very thirsty. ? Your eyes making fewer tears. ? Not sweating when body temperature is high, such as in hot weather. ? Your body making very little pee.  Changes in vital signs, such as: ? Weak pulse. ? Pulse that is more than 100 beats a minute when you are sitting still. ? Fast breathing. ? Low blood pressure.  Other changes, such as: ? Sunken eyes. ? Cold hands and feet. ? Confusion. ? Lack of energy (lethargy). ? Trouble waking up from sleep. ? Short-term weight loss. ? Unconsciousness. Follow these instructions at home:  If told by your doctor, drink an ORS: ? Make an ORS by using instructions on the package. ? Start by drinking small amounts, about  cup (120 mL) every 5-10 minutes. ? Slowly drink more until you have had the amount that your doctor said to have.  Drink enough clear fluid to keep your pee clear or pale yellow. If you were told to drink an ORS, finish the ORS first, then start slowly drinking clear fluids. Drink fluids such as: ? Water. Do not drink only water by itself. Doing that can make the salt (sodium) level in your body get too low (hyponatremia). ? Ice chips. ? Fruit juice that you have added water to (diluted). ? Low-calorie sports drinks.  Avoid: ? Alcohol. ? Drinks that have a lot of sugar. These include high-calorie sports drinks, fruit juice that does not have water added, and soda. ? Caffeine. ? Foods that are greasy or have a lot of fat or sugar.  Take over-the-counter and prescription medicines only as told by your doctor.  Do not take salt tablets. Doing that can make the salt level in your  body get too high (hypernatremia).  Eat foods that have minerals (electrolytes). Examples include bananas, oranges, potatoes, tomatoes, and spinach.  Keep all follow-up visits as told by your doctor. This is important. Contact a doctor if:  You have belly (abdominal) pain that: ? Gets worse. ? Stays in one area (localizes).  You have a rash.  You have a stiff neck.  You get angry or annoyed more easily than normal (irritability).  You are more sleepy than normal.  You have a harder time waking up than normal.  You feel: ? Weak. ? Dizzy. ? Very thirsty.  You have peed (urinated) only a small amount of very dark pee during 6-8 hours. Get help right away if:  You have symptoms of very bad dehydration.  You cannot drink fluids without throwing up (vomiting).  Your symptoms get worse with treatment.  You have a fever.  You have a very bad headache.  You are throwing up or having watery poop (diarrhea)  and it: ? Gets worse. ? Does not go away.  You have blood or something green (bile) in your throw-up.  You have blood in your poop (stool). This may cause poop to look black and tarry.  You have not peed in 6-8 hours.  You pass out (faint).  Your heart rate when you are sitting still is more than 100 beats a minute.  You have trouble breathing. This information is not intended to replace advice given to you by your health care provider. Make sure you discuss any questions you have with your health care provider. Document Released: 03/09/2009 Document Revised: 12/01/2015 Document Reviewed: 07/07/2015 Elsevier Interactive Patient Education  2018 [employer].

## 2017-09-21 NOTE — OB Triage Note (Signed)
Patient states that she woke this morning around 4am with intermittent lower abdomen and back pain rating it 8/10.  She denies taking any medication for pain.  She does state that she feels need to frequently urinate but denies any burning. Last sexual intercourse about a week ago.

## 2017-09-22 NOTE — Discharge Summary (Signed)
    L&D OB Triage Note  SUBJECTIVE Casey Owens is a 27 y.o. [id number] female at [redacted]w[redacted]d, EDD Estimated Date of Delivery: 11/11/17 who presented to triage with complaints of contractions.  Pt has h/o pre-term labor and delivery.  Previously received steroids.   OB History  Gravida Para Term Preterm AB Living  0 4  SAB TAB Ectopic Multiple Live Births  0 0 0 0 4    # Outcome Date GA Lbr Len/2nd Weight Sex Delivery Anes PTL Lv  5 Current           4 Preterm 08/16/16 [redacted]w[redacted]d 814:40 / 00:15 4 lb 2 oz (1.87 kg) F Vag-Spont EPI  LIV     Name: Abramo,GIRL DMTRIEVE     Apgar1: 8  Apgar5: 9  3 Preterm 03/24/15 [redacted]w[redacted]d 04:46 / 00:02 4 lb 5 oz (1.956 kg) F Vag-Spont   LIV     Name: Maahs,GIRL DMTRIEVE     Apgar1: 9  Apgar5: 9  2 Preterm 06/20/11 [redacted]w[redacted]d  4 lb 6 oz (1.984 kg) M Vag-Spont None Y LIV     Complications: Preterm labor in second trimester with preterm delivery  1 Term 05/08/08 [redacted]w[redacted]d  5 lb 4 oz (2.381 kg) F  None Y LIV    No medications prior to admission.     OBJECTIVE  Nursing Evaluation:   BP 110/74 (BP Location: Left Arm)   Pulse 63   Temp 98.2 F (36.8 C) (Oral)   Resp 16   Ht  (1.626 m)   Wt 151 lb (68.5 kg)   LMP 11/14/2016   BMI 25.92 kg/m    Findings:   Rare mild contractions U/A - ok  NST was performed and has been reviewed by me.  NST INTERPRETATION: Category I  Mode: External Baseline Rate (A): 130 bpm(fht; pt to be discharged) Variability: Moderate Accelerations: 15 x 15 Decelerations: None     Contraction Frequency (min): 2-9  ASSESSMENT Impression:  1.  Pregnancy:  [id number] at [redacted]w[redacted]d , EDD Estimated Date of Delivery: 11/11/17 2.  NST:  Category I  3.  Pt not in labor  PLAN 1. Reassurance given 2. Discharge home with standard labor precautions given to return to L&D or call the office for problems. 3. Continue routine prenatal care.

## 2017-10-10 ENCOUNTER — Inpatient Hospital Stay
Admission: EM | Admit: 2017-10-10 | Discharge: 2017-10-12 | DRG: 805 | Disposition: A | Discharge status: Home / Self Care | Payer: Medicaid Other | Attending: Obstetrics and Gynecology | Admitting: Obstetrics and Gynecology

## 2017-10-10 ENCOUNTER — Other Ambulatory Visit: Payer: Self-pay

## 2017-10-10 DIAGNOSIS — O3430 Maternal care for cervical incompetence, unspecified trimester: Secondary | ICD-10-CM

## 2017-10-10 DIAGNOSIS — O99334 Smoking (tobacco) complicating childbirth: Secondary | ICD-10-CM | POA: Diagnosis present

## 2017-10-10 DIAGNOSIS — F17210 Nicotine dependence, cigarettes, uncomplicated: Secondary | ICD-10-CM | POA: Diagnosis present

## 2017-10-10 DIAGNOSIS — O409XX0 Polyhydramnios, unspecified trimester, not applicable or unspecified: Secondary | ICD-10-CM

## 2017-10-10 DIAGNOSIS — O99330 Smoking (tobacco) complicating pregnancy, unspecified trimester: Secondary | ICD-10-CM

## 2017-10-10 DIAGNOSIS — O403XX0 Polyhydramnios, third trimester, not applicable or unspecified: Secondary | ICD-10-CM | POA: Diagnosis present

## 2017-10-10 DIAGNOSIS — O0993 Supervision of high risk pregnancy, unspecified, third trimester: Secondary | ICD-10-CM

## 2017-10-10 DIAGNOSIS — Z3A35 35 weeks gestation of pregnancy: Secondary | ICD-10-CM

## 2017-10-10 DIAGNOSIS — O09219 Supervision of pregnancy with history of pre-term labor, unspecified trimester: Secondary | ICD-10-CM

## 2017-10-10 DIAGNOSIS — F1991 Other psychoactive substance use, unspecified, in remission: Secondary | ICD-10-CM

## 2017-10-10 DIAGNOSIS — Z87898 Personal history of other specified conditions: Secondary | ICD-10-CM

## 2017-10-10 LAB — URINE DRUG SCREEN, QUALITATIVE (ARMC ONLY)
AMPHETAMINES, UR SCREEN: NOT DETECTED
Barbiturates, Ur Screen: NOT DETECTED
Benzodiazepine, Ur Scrn: NOT DETECTED
COCAINE METABOLITE, UR ~~LOC~~: NOT DETECTED
Cannabinoid 50 Ng, Ur ~~LOC~~: POSITIVE — AB
MDMA (ECSTASY) UR SCREEN: NOT DETECTED
METHADONE SCREEN, URINE: NOT DETECTED
OPIATE, UR SCREEN: NOT DETECTED
Phencyclidine (PCP) Ur S: NOT DETECTED
Tricyclic, Ur Screen: NOT DETECTED

## 2017-10-10 LAB — RAPID HIV SCREEN (HIV 1/2 AB+AG)
HIV 1/2 Antibodies: NONREACTIVE
HIV-1 P24 Antigen - HIV24: NONREACTIVE

## 2017-10-10 LAB — CBC
HEMATOCRIT: 36.1 % (ref 35.0–47.0)
HEMOGLOBIN: 12.5 g/dL (ref 12.0–16.0)
MCH: 33 pg (ref 26.0–34.0)
MCHC: 34.6 g/dL (ref 32.0–36.0)
MCV: 95.5 fL (ref 80.0–100.0)
Platelets: 333 10*3/uL (ref 150–440)
RBC: 3.78 MIL/uL — ABNORMAL LOW (ref 3.80–5.20)
RDW: 13.2 % (ref 11.5–14.5)
WBC: 13 10*3/uL — ABNORMAL HIGH (ref 3.6–11.0)

## 2017-10-10 LAB — TYPE AND SCREEN
ABO/RH(D): O POS
Antibody Screen: NEGATIVE

## 2017-10-10 LAB — URINALYSIS, ROUTINE W REFLEX MICROSCOPIC
Bilirubin Urine: NEGATIVE
Glucose, UA: NEGATIVE mg/dL
Hgb urine dipstick: NEGATIVE
Ketones, ur: 20 mg/dL — AB
LEUKOCYTES UA: NEGATIVE
NITRITE: NEGATIVE
PH: 6 (ref 5.0–8.0)
Protein, ur: NEGATIVE mg/dL
SPECIFIC GRAVITY, URINE: 1.017 (ref 1.005–1.030)

## 2017-10-10 LAB — CHLAMYDIA/NGC RT PCR (ARMC ONLY)
Chlamydia Tr: NOT DETECTED
N GONORRHOEAE: NOT DETECTED

## 2017-10-10 MED ORDER — OXYTOCIN 40 UNITS IN LACTATED RINGERS INFUSION - SIMPLE MED
INTRAVENOUS | Status: AC
  Administered 2017-10-10: 62.5 mL/h
  Filled 2017-10-10: qty 1000

## 2017-10-10 MED ORDER — COCONUT OIL OIL: 1.0000 "application " | TOPICAL_OIL | Status: DC | PRN

## 2017-10-10 MED ORDER — SODIUM CHLORIDE 0.9 % IV SOLN
1.0000 g | INTRAVENOUS | Status: DC
  Administered 2017-10-10: 1 g via INTRAVENOUS
  Filled 2017-10-10 (×5): qty 1000

## 2017-10-10 MED ORDER — ONDANSETRON HCL 4 MG/2ML IJ SOLN: 4.0000 mg | Freq: Four times a day (QID) | INTRAMUSCULAR | Status: DC | PRN

## 2017-10-10 MED ORDER — ONDANSETRON HCL 4 MG PO TABS: 4.0000 mg | ORAL_TABLET | ORAL | Status: DC | PRN

## 2017-10-10 MED ORDER — ACETAMINOPHEN 325 MG PO TABS
650.0000 mg | ORAL_TABLET | ORAL | Status: DC | PRN
  Administered 2017-10-10: 650 mg via ORAL
  Filled 2017-10-10: qty 2

## 2017-10-10 MED ORDER — OXYCODONE-ACETAMINOPHEN 5-325 MG PO TABS: 2.0000 | ORAL_TABLET | ORAL | Status: DC | PRN

## 2017-10-10 MED ORDER — OXYCODONE-ACETAMINOPHEN 5-325 MG PO TABS: 1.0000 | ORAL_TABLET | ORAL | Status: DC | PRN

## 2017-10-10 MED ORDER — BETAMETHASONE SOD PHOS & ACET 6 (3-3) MG/ML IJ SUSP
INTRAMUSCULAR | Status: AC
  Filled 2017-10-10: qty 1

## 2017-10-10 MED ORDER — FENTANYL 2.5 MCG/ML W/ROPIVACAINE 0.15% IN NS 100 ML EPIDURAL (ARMC)
EPIDURAL | Status: AC
  Filled 2017-10-10: qty 100

## 2017-10-10 MED ORDER — SODIUM CHLORIDE 0.9 % IV SOLN
2.0000 g | Freq: Once | INTRAVENOUS | Status: AC
  Administered 2017-10-10: 2 g via INTRAVENOUS
  Filled 2017-10-10: qty 2000

## 2017-10-10 MED ORDER — SOD CITRATE-CITRIC ACID 500-334 MG/5ML PO SOLN: 30.0000 mL | ORAL | Status: DC | PRN

## 2017-10-10 MED ORDER — OXYTOCIN 40 UNITS IN LACTATED RINGERS INFUSION - SIMPLE MED: 2.5000 [IU]/h | INTRAVENOUS | Status: DC

## 2017-10-10 MED ORDER — LACTATED RINGERS IV SOLN
500.0000 mL | INTRAVENOUS | Status: DC | PRN
  Administered 2017-10-10 (×2): 500 mL via INTRAVENOUS

## 2017-10-10 MED ORDER — WITCH HAZEL-GLYCERIN EX PADS
1.0000 "application " | MEDICATED_PAD | CUTANEOUS | Status: DC | PRN
  Filled 2017-10-10: qty 100

## 2017-10-10 MED ORDER — ONDANSETRON HCL 4 MG/2ML IJ SOLN: 4.0000 mg | INTRAMUSCULAR | Status: DC | PRN

## 2017-10-10 MED ORDER — IBUPROFEN 800 MG PO TABS
800.0000 mg | ORAL_TABLET | Freq: Four times a day (QID) | ORAL | Status: DC
  Administered 2017-10-10 – 2017-10-11 (×5): 800 mg via ORAL
  Filled 2017-10-10 (×4): qty 1

## 2017-10-10 MED ORDER — IBUPROFEN 800 MG PO TABS
ORAL_TABLET | ORAL | Status: AC
  Administered 2017-10-10: 800 mg via ORAL
  Filled 2017-10-10: qty 1

## 2017-10-10 MED ORDER — DIPHENHYDRAMINE HCL 25 MG PO CAPS: 25.0000 mg | ORAL_CAPSULE | Freq: Four times a day (QID) | ORAL | Status: DC | PRN

## 2017-10-10 MED ORDER — ZOLPIDEM TARTRATE 5 MG PO TABS: 5.0000 mg | ORAL_TABLET | Freq: Every evening | ORAL | Status: DC | PRN

## 2017-10-10 MED ORDER — ACETAMINOPHEN 325 MG PO TABS: 650.0000 mg | ORAL_TABLET | ORAL | Status: DC | PRN

## 2017-10-10 MED ORDER — BETAMETHASONE SOD PHOS & ACET 6 (3-3) MG/ML IJ SUSP
12.0000 mg | Freq: Once | INTRAMUSCULAR | Status: AC
  Administered 2017-10-10: 12 mg via INTRAMUSCULAR

## 2017-10-10 MED ORDER — DIBUCAINE 1 % RE OINT
1.0000 "application " | TOPICAL_OINTMENT | RECTAL | Status: DC | PRN
  Filled 2017-10-10: qty 28

## 2017-10-10 MED ORDER — LACTATED RINGERS IV SOLN
INTRAVENOUS | Status: DC
  Administered 2017-10-10: 999 mL via INTRAVENOUS
  Administered 2017-10-10: 1000 mL via INTRAVENOUS

## 2017-10-10 MED ORDER — PRENATAL MULTIVITAMIN CH
1.0000 | ORAL_TABLET | Freq: Every day | ORAL | Status: DC
  Administered 2017-10-11 – 2017-10-12 (×2): 1 via ORAL
  Filled 2017-10-10 (×2): qty 1

## 2017-10-10 MED ORDER — OXYTOCIN 10 UNIT/ML IJ SOLN
INTRAMUSCULAR | Status: AC
  Filled 2017-10-10: qty 2

## 2017-10-10 MED ORDER — LIDOCAINE HCL (PF) 1 % IJ SOLN
INTRAMUSCULAR | Status: AC
  Filled 2017-10-10: qty 30

## 2017-10-10 MED ORDER — OXYTOCIN BOLUS FROM INFUSION
500.0000 mL | Freq: Once | INTRAVENOUS | Status: AC
  Administered 2017-10-10: 500 mL via INTRAVENOUS

## 2017-10-10 MED ORDER — LACTATED RINGERS IV BOLUS: 1000.0000 mL | Freq: Once | INTRAVENOUS | Status: DC

## 2017-10-10 MED ORDER — BUTORPHANOL TARTRATE 1 MG/ML IJ SOLN: 1.0000 mg | INTRAMUSCULAR | Status: DC | PRN

## 2017-10-10 MED ORDER — MISOPROSTOL 200 MCG PO TABS
ORAL_TABLET | ORAL | Status: AC
Start: 2017-10-10 — End: 2017-10-11
  Filled 2017-10-10: qty 4

## 2017-10-10 MED ORDER — TETANUS-DIPHTH-ACELL PERTUSSIS 5-2.5-18.5 LF-MCG/0.5 IM SUSP: 0.5000 mL | Freq: Once | INTRAMUSCULAR | Status: DC

## 2017-10-10 MED ORDER — BENZOCAINE-MENTHOL 20-0.5 % EX AERO
1.0000 "application " | INHALATION_SPRAY | CUTANEOUS | Status: DC | PRN
  Administered 2017-10-10: 1 via TOPICAL
  Filled 2017-10-10: qty 56

## 2017-10-10 MED ORDER — SENNOSIDES-DOCUSATE SODIUM 8.6-50 MG PO TABS
2.0000 | ORAL_TABLET | ORAL | Status: DC
  Administered 2017-10-11 – 2017-10-12 (×2): 2 via ORAL
  Filled 2017-10-10 (×2): qty 2

## 2017-10-10 MED ORDER — SIMETHICONE 80 MG PO CHEW: 80.0000 mg | CHEWABLE_TABLET | ORAL | Status: DC | PRN

## 2017-10-10 MED ORDER — AMMONIA AROMATIC IN INHA
RESPIRATORY_TRACT | Status: AC
  Filled 2017-10-10: qty 10

## 2017-10-10 MED ORDER — LIDOCAINE HCL (PF) 1 % IJ SOLN: 30.0000 mL | INTRAMUSCULAR | Status: DC | PRN

## 2017-10-10 NOTE — Progress Notes (Signed)
Intrapartum Progress Note  S: Patient feels pain with contractions. Still declines pain medications or epidural currently.   O: Blood pressure (!) 128/93, pulse (!) 53, temperature 98 F (36.7 C), resp. rate 18, last menstrual period 11/14/2016, unknown if currently breastfeeding. Gen App: NAD, comfortable Abdomen: soft, gravid FHT: baseline 130 bpm.  Accels present.  Decels absent. moderate in degree variability.   Tocometer: contractions q 1-2 minutes Cervix: 6/80/-2 Extremities: Nontender, no edema.  Pitocin: None  Labs: Results for orders placed or performed during the hospital encounter of 10/10/17  Chlamydia/NGC rt PCR ([facility] only)  Result Value Ref Range   Specimen source GC/Chlam URINE, RANDOM    Chlamydia Tr NOT DETECTED NOT DETECTED   N gonorrhoeae NOT DETECTED NOT DETECTED  CBC  Result Value Ref Range   WBC 13.0 (H) 3.6 - 11.0 K/uL   RBC 3.78 (L) 3.80 - 5.20 MIL/uL   Hemoglobin 12.5 12.0 - 16.0 g/dL   HCT 16.1 09.6 - 04.5 %   MCV 95.5 80.0 - 100.0 fL   MCH 33.0 26.0 - 34.0 pg   MCHC 34.6 32.0 - 36.0 g/dL   RDW 40.9 81.1 - 91.4 %   Platelets 333 150 - 440 K/uL  Urinalysis, Routine w reflex microscopic  Result Value Ref Range   Color, Urine YELLOW (A) YELLOW   APPearance CLEAR (A) CLEAR   Specific Gravity, Urine 1.017 1.005 - 1.030   pH 6.0 5.0 - 8.0   Glucose, UA NEGATIVE NEGATIVE mg/dL   Hgb urine dipstick NEGATIVE NEGATIVE   Bilirubin Urine NEGATIVE NEGATIVE   Ketones, ur 20 (A) NEGATIVE mg/dL   Protein, ur NEGATIVE NEGATIVE mg/dL   Nitrite NEGATIVE NEGATIVE   Leukocytes, UA NEGATIVE NEGATIVE  Urine Drug Screen, Qualitative ([facility] only)  Result Value Ref Range   Tricyclic, Ur Screen NONE DETECTED NONE DETECTED   Amphetamines, Ur Screen NONE DETECTED NONE DETECTED   MDMA (Ecstasy)Ur Screen NONE DETECTED NONE DETECTED   Cocaine Metabolite,Ur [place] NONE DETECTED NONE DETECTED   Opiate, Ur Screen NONE DETECTED NONE DETECTED   Phencyclidine (PCP) Ur S NONE  DETECTED NONE DETECTED   Cannabinoid 50 Ng, Ur [place] POSITIVE (A) NONE DETECTED   Barbiturates, Ur Screen NONE DETECTED NONE DETECTED   Benzodiazepine, Ur Scrn NONE DETECTED NONE DETECTED   Methadone Scn, Ur NONE DETECTED NONE DETECTED  Rapid HIV screen (HIV 1/2 Ab+Ag) ([facility] Only)  Result Value Ref Range   HIV-1 P24 Antigen - HIV24 NON REACTIVE NON REACTIVE   HIV 1/2 Antibodies NON REACTIVE NON REACTIVE   Interpretation (HIV Ag Ab)      A non reactive test result means that HIV 1 or HIV 2 antibodies and HIV 1 p24 antigen were not detected in the specimen.  Type and screen [facility]  Result Value Ref Range   ABO/RH(D) O POS    Antibody Screen NEG    Sample Expiration      10/13/2017 Performed at [facility], [address] [place], [place] [address]       Assessment:  1: SIUP at [redacted]w[redacted]d 2. Preterm labor (active) with prior h/o preterm labor 3. GBS unknown (however prior GBS in second trimester negative) 4. H/o asymptomatic bacteriuria 5. MJ +   Plan:  1. Patient currently receiving 2nd dose of antibiotic.  AROM'd with clear fluid. Will augment with Pitocin as needed.  2. Pain management as desired.  3. UA negative today, will await urine culture. Is receiving IV antibiotics for GBS  status unknown.  4. Will encourage cessation. Patient does not plan to breastfeed.  5. Continue routine checks.   Hildred Laser, MD 10/10/2017 5:27 PM

## 2017-10-10 NOTE — H&P (Signed)
Obstetric History and Physical  Casey Owens is a 27 y.o. [id number] with IUP at [redacted]w[redacted]d presenting for complaints of contractions. Patient states she had been having irregular contractions since yesterday, however became stronger and closer together (every 2-3 minutes) several hours ago.   She denies contractions, none vaginal bleeding, intact membranes, with active fetal movement.    Prenatal Course Source of Care: [facility] with onset of care at 11 weeks Pregnancy complications or risks: Patient Active Problem List   Diagnosis Date Noted  . Preterm labor 10/10/2017  . Indication for care in labor or delivery 09/21/2017  . Preterm uterine contractions in second trimester, antepartum 08/08/2017  . Polyhydramnios affecting pregnancy 07/30/2017  . Cervical insufficiency during pregnancy, antepartum 07/30/2017  . Asymptomatic bacteriuria during pregnancy in second trimester 05/27/2017  . History of drug use 04/29/2017  . Previous preterm delivery, antepartum 04/29/2017  . Supervision of high-risk pregnancy 04/28/2017  . Tobacco use in pregnancy 02/10/2015   She plans to bottle feed She desires bilateral tubal ligation for postpartum contraception.   Prenatal labs and studies: ABO, Rh: O/Positive/-- (12/04 1520) Antibody: Negative (12/04 1520) Rubella: 1.76 (12/04 1520) RPR: Non Reactive (03/20 0850)  HBsAg: Negative (12/04 1520)  HIV: Non Reactive (03/20 0850)  GBS: Unknown 1 hr Glucola  normal Genetic screening normal Anatomy US normal   Past Medical History:  Diagnosis Date  . Asthma   . Depression   . 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 NO PAST SURGERIES      OB History  Gravida Para Term Preterm AB Living  5 4 1 3   4   SAB TAB Ectopic Multiple Live Births        0 4    # Outcome Date GA Lbr Len/2nd Weight Sex Delivery Anes PTL Lv  5  Current           4 Preterm 08/16/16 [redacted]w[redacted]d 814:40 / 00:15 4 lb 2 oz (1.87 kg) F Vag-Spont EPI  LIV  3 Preterm 03/24/15 [redacted]w[redacted]d 04:46 / 00:02 4 lb 5 oz (1.956 kg) F Vag-Spont   LIV  2 Preterm 06/20/11 [redacted]w[redacted]d  4 lb 6 oz (1.984 kg) M Vag-Spont None Y LIV     Complications: Preterm labor in second trimester with preterm delivery  1 Term 05/08/08 [redacted]w[redacted]d  5 lb 4 oz (2.381 kg) F  None Y LIV    Social History   Socioeconomic History  . Marital status: Single    Spouse name: Not on file  . Number of children: Not on file  . Years of education: Not on file  . Highest education level: Not on file  Occupational History  . Not on file  Social Needs  . Financial resource strain: Not on file  . Food insecurity:    Worry: Not on file    Inability: Not on file  . Transportation needs:    Medical: Not on file    Non-medical: Not on file  Tobacco Use  . Smoking status: Current Every Day Smoker    Packs/day: 0.25    Years: 10.00    Pack years: 2.50    Types: Cigarettes  . Smokeless tobacco: Never Used  Substance and Sexual Activity  . Alcohol use: No  . Drug use: No    Comment: says quit drug use in January  . Sexual activity: Yes  Birth control/protection: Other-see comments    Comment: tubal ligation  Lifestyle  . Physical activity:    Days per week: Not on file    Minutes per session: Not on file  . Stress: Not on file  Relationships  . Social connections:    Talks on phone: Not on file    Gets together: Not on file    Attends religious service: Not on file    Active member of club or organization: Not on file    Attends meetings of clubs or organizations: Not on file    Relationship status: Not on file  Other Topics Concern  . Not on file  Social History Narrative  . Not on file    Family History  Problem Relation Age of Onset  . Hypertension Mother   . Arthritis Paternal Aunt   . Hypertension Maternal Grandmother   . Arthritis Paternal Grandmother     Medications  Prior to Admission  Medication Sig Dispense Refill Last Dose  . ondansetron (ZOFRAN-ODT) 4 MG disintegrating tablet Take 1 tablet (4 mg total) by mouth every 6 (six) hours as needed for nausea or vomiting. 20 tablet 0 Past Month at Unknown time  . Prenatal Vit-Fe Fumarate-FA (PRENATAL MULTIVITAMIN) TABS tablet Take 1 tablet by mouth daily at 12 noon.   10/10/2017 at Unknown time  . cephALEXin (KEFLEX) 500 MG capsule Take 1 capsule (500 mg total) by mouth 3 (three) times daily. (Patient not taking: Reported on 08/27/2017) 21 capsule 0 Not Taking at Unknown time  . progesterone (PROMETRIUM) 200 MG capsule Place one capsule vaginally at bedtime (Patient not taking: Reported on 08/08/2017) 30 capsule 3 Not Taking at Unknown time    Allergies  Allergen Reactions  . Shellfish Allergy Swelling    Review of Systems: Negative except for what is mentioned in HPI.  Physical Exam: LMP 11/14/2016  CONSTITUTIONAL: Well-developed, well-nourished female in no acute distress.  HENT:  Normocephalic, atraumatic, External right and left ear normal. Oropharynx is clear and moist EYES: Conjunctivae and EOM are normal. Pupils are equal, round, and reactive to light. No scleral icterus.  NECK: Normal range of motion, supple, no masses SKIN: Skin is warm and dry. No rash noted. Not diaphoretic. No erythema. No pallor. NEUROLOGIC: Alert and oriented to person, place, and time. Normal reflexes, muscle tone coordination. No cranial nerve deficit noted. PSYCHIATRIC: Normal mood and affect. Normal behavior. Normal judgment and thought content. CARDIOVASCULAR: Normal heart rate noted, regular rhythm RESPIRATORY: Effort and breath sounds normal, no problems with respiration noted ABDOMEN: Soft, nontender, nondistended, gravid. MUSCULOSKELETAL: Normal range of motion. No edema and no tenderness. 2+ distal pulses.  Cervical Exam: Dilatation 4.5cm   Effacement 80%   Station -2   Presentation: cephalic FHT:  Baseline rate  191 bpm   Variability moderate  Accelerations present   Decelerations none Contractions: Every 2-4 mins   Pertinent Labs/Studies:   No results found for this or any previous visit (from the past 24 hour(s)).    [place] MFM OB FOLLOW UP ----------------------------------------------------------------------  OBSTETRICS REPORT                      (Signed Final 07/23/2017 10:21 pm) ---------------------------------------------------------------------- Patient Info  ID #:       [id number]                          D.O.B.:  1991/02/16 (26 yrs)  Name:  Casey Owens              Visit Date: 07/23/2017 01:55 pm ---------------------------------------------------------------------- Performed By  Performed By:     Percell Boston          Ref. Address:     [address]                                                             [place]                                                             [place] [address]  Attending:        Erle Crocker MD     Location:         [facility]  Referred By:      Jorje Guild CNM ---------------------------------------------------------------------- Orders   #  Description                                 Code   1  [place] MFM OB TRANSVAGINAL                      [id number]   2  [place] MFM OB FOLLOW UP                         [id number]  ----------------------------------------------------------------------   #  Ordered By               Order #        Accession #    Episode #   1  Darlyn Read              [id number]      [id number]     [id number]   2  EMILY BUNCE              [id number]      [id number]     [id number]  ---------------------------------------------------------------------- Indications   [redacted] weeks gestation of pregnancy                Z3A.24   Poor obstetric history: Previous preterm       O09.219   delivery, antepartum (x3 @ 34 wks)   Cocaine abuse (Pt states quit in January       O99.320  F14.10   2019)   Marijuana Abuse (Pt states quit in January   2019)   Smoking complicating pregnancy, second         O99.332   trimester (0.25 pk/day)   Encounter for cervical length                  Z36.86   Encounter for other antenatal screening        Z36.2  follow-up  ---------------------------------------------------------------------- OB History  Blood Type:            Height:  5'4"   Weight (lb):  136       BMI:  23.34  Gravidity:    5         Term:   1        Prem:   3  Living:       4 ---------------------------------------------------------------------- Fetal Evaluation  Num Of Fetuses:     1  Fetal Heart         150  Rate(bpm):  Cardiac Activity:   Observed  Presentation:       Breech  Placenta:           Anterior, above cervical os  P. Cord Insertion:  Visualized, central  Amniotic Fluid  AFI FV:      Polyhydramnios-Mild                              Largest Pocket(cm)                              8.8 ---------------------------------------------------------------------- Biometry  BPD:      57.2  mm     G. Age:  23w 3d         21  %    CI:        79.83   %    70 - 86                                                          FL/HC:      22.3   %    18.7 - 20.9  HC:      202.3  mm     G. Age:  22w 2d        < 3  %    HC/AC:      1.07        1.05 - 1.21  AC:      188.2  mm     G. Age:  23w 4d         24  %    FL/BPD:     79.0   %    71 - 87  FL:       45.2  mm     G. Age:  25w 0d         63  %    FL/AC:      24.0   %    20 - 24  HUM:      43.5  mm     G. Age:  25w 6d         86  %  Est. FW:     647  gm      1 lb 7 oz     48  % ---------------------------------------------------------------------- Gestational Age  LMP:           35w 6d        Date:  11/14/16                 EDD:   08/21/17  U/S Today:     23w 4d  EDD:   11/15/17  Best:          24w 1d     Det. ByMarcella Dubs         EDD:   11/11/17                                       (04/23/17) ---------------------------------------------------------------------- Anatomy  Cranium:               Appears normal         Aortic Arch:            Previously seen  Cavum:                 Previously seen        Ductal Arch:            Previously seen  Ventricles:            Appears normal         Diaphragm:              Previously seen  Choroid Plexus:        Previously seen        Stomach:                Appears normal, left                                                                        sided  Cerebellum:            Previously seen        Abdomen:                Previously seen  Posterior Fossa:       Previously seen        Abdominal Wall:         Previously seen  Nuchal Fold:           Not applicable (>20    Cord Vessels:           Previously seen                         wks GA)  Face:                  Orbits and profile     Kidneys:                Appear normal                         previously seen  Lips:                  Previously seen        Bladder:                Appears normal  Thoracic:              Appears normal         Spine:                  Appears  normal  Heart:                 Appears normal         Upper Extremities:      Previously seen                         (4CH, axis, and situs  RVOT:                  Previously seen        Lower Extremities:      Previously seen  LVOT:                  Previously seen  Other:  Fetus appears to be a female. Heels and 5th digit visualized prev.Marland Kitchen          Open hands visualized prev. Nasal bone visualized prev.. Technically          difficult due to fetal position and fetal movement. ---------------------------------------------------------------------- Cervix Uterus Adnexa  Cervix  Length:            2.5  cm.  Normal appearance by transvaginal scan  Uterus  No abnormality visualized.  Left Ovary  No adnexal mass visualized.  Right Ovary  No adnexal mass visualized.  Cul De Sac:   No free fluid  seen.  Adnexa:       No abnormality visualized. ---------------------------------------------------------------------- Impression  Indication: 27 yr old [id number] at [redacted]w[redacted]d with history of  preterm deliveries and previous finding of mild  polyhydramnios for fetal growth and cervical length.  Findings:  1. Single intrauterine pregnancy with cardiac activity.  2. Estimated fetal weight is in the 48th%.  3. Anterior placenta without evidence of previa.  4. Polyhydramnios with MVP of 8.8 cm.  5. Borderline transvaginal cervical length of 2.5cm; no  funneling is seen.  6. The limited anatomy survey is normal; any anatomy not  evaluated on today's exam was evaluated on the previous  exam. ---------------------------------------------------------------------- Recommendations  1. Appropriate fetal growth.  2. Fetal anatomic survey is complete.  3. Previous preterm deliveries:  - per chart patient declined 9 OH progesterone  - given borderline cervical length today I recommend offering  vaginal progesterone and repeating cervical length in 1 week  - recommend preterm labor precautions  4. Polyhydramnios:  - unclear etiology  - recommend obtain a glucola  - reevaluate in 1 week  5. Normal quad screen ----------------------------------------------------------------------                Erle Crocker, MD Electronically Signed Final Report   07/23/2017 10:21 pm ---------------------------------------------------------------------- [place] MFM OB Transvaginal ----------------------------------------------------------------------  OBSTETRICS REPORT                      (Signed Final 07/23/2017 10:21 pm) ---------------------------------------------------------------------- Patient Info  ID #:       [id number]                          D.O.B.:  [birth year] (26 yrs)  Name:       Casey Owens              Visit Date: 07/23/2017 01:55  pm ---------------------------------------------------------------------- Performed By  Performed By:     Percell Boston          Ref. Address:     [address]  [facility]                                                             [place]                                                             [place] [address]  Attending:        Erle Crocker MD     Location:         [facility]  Referred By:      Jorje Guild CNM ---------------------------------------------------------------------- Orders   #  Description                                 Code   1  [place] MFM OB TRANSVAGINAL                      [phone]   2  [place] MFM OB FOLLOW UP                         76816.01  ----------------------------------------------------------------------   #  Ordered By               Order #        Accession #    Episode #   1  Darlyn Read              [id number]      [id number]     [id number]   2  EMILY BUNCE              [id number]      [id number]     [id number]  ---------------------------------------------------------------------- Indications   [redacted] weeks gestation of pregnancy                Z3A.24   Poor obstetric history: Previous preterm       O09.219   delivery, antepartum (x3 @ 34 wks)   Cocaine abuse (Pt states quit in January       O99.320 F14.10   2019)   Marijuana Abuse (Pt states quit in January   2019)   Smoking complicating pregnancy, second         O99.332   trimester (0.25 pk/day)   Encounter for cervical length                  Z36.86   Encounter for other antenatal screening        Z36.2   follow-up  ---------------------------------------------------------------------- OB History  Blood Type:            Height:  5'4"   Weight (lb):  136       BMI:  23.34  Gravidity:    5         Term:   1        Prem:   3  Living:  4 ---------------------------------------------------------------------- Fetal Evaluation  Num Of Fetuses:     1   Fetal Heart         150  Rate(bpm):  Cardiac Activity:   Observed  Presentation:       Breech  Placenta:           Anterior, above cervical os  P. Cord Insertion:  Visualized, central  Amniotic Fluid  AFI FV:      Polyhydramnios-Mild                              Largest Pocket(cm)                              8.8 ---------------------------------------------------------------------- Biometry  BPD:      57.2  mm     G. Age:  23w 3d         21  %    CI:        79.83   %    70 - 86                                                          FL/HC:      22.3   %    18.7 - 20.9  HC:      202.3  mm     G. Age:  22w 2d        < 3  %    HC/AC:      1.07        1.05 - 1.21  AC:      188.2  mm     G. Age:  23w 4d         24  %    FL/BPD:     79.0   %    71 - 87  FL:       45.2  mm     G. Age:  25w 0d         63  %    FL/AC:      24.0   %    20 - 24  HUM:      43.5  mm     G. Age:  25w 6d         86  %  Est. FW:     647  gm      1 lb 7 oz     48  % ---------------------------------------------------------------------- Gestational Age  LMP:           35w 6d        Date:  11/14/16                 EDD:   08/21/17  U/S Today:     23w 4d                                        EDD:   11/15/17  Best:          24w 1d     Det. By:  Marcella Dubs         EDD:  11/11/17                                      (04/23/17) ---------------------------------------------------------------------- Anatomy  Cranium:               Appears normal         Aortic Arch:            Previously seen  Cavum:                 Previously seen        Ductal Arch:            Previously seen  Ventricles:            Appears normal         Diaphragm:              Previously seen  Choroid Plexus:        Previously seen        Stomach:                Appears normal, left                                                                        sided  Cerebellum:            Previously seen        Abdomen:                Previously seen   Posterior Fossa:       Previously seen        Abdominal Wall:         Previously seen  Nuchal Fold:           Not applicable (>20    Cord Vessels:           Previously seen                         wks GA)  Face:                  Orbits and profile     Kidneys:                Appear normal                         previously seen  Lips:                  Previously seen        Bladder:                Appears normal  Thoracic:              Appears normal         Spine:                  Appears normal  Heart:                 Appears normal         Upper Extremities:      Previously  seen                         (4CH, axis, and situs  RVOT:                  Previously seen        Lower Extremities:      Previously seen  LVOT:                  Previously seen  Other:  Fetus appears to be a female. Heels and 5th digit visualized prev.Marland Kitchen          Open hands visualized prev. Nasal bone visualized prev.. Technically          difficult due to fetal position and fetal movement. ---------------------------------------------------------------------- Cervix Uterus Adnexa  Cervix  Length:            2.5  cm.  Normal appearance by transvaginal scan  Uterus  No abnormality visualized.  Left Ovary  No adnexal mass visualized.  Right Ovary  No adnexal mass visualized.  Cul De Sac:   No free fluid seen.  Adnexa:       No abnormality visualized. ---------------------------------------------------------------------- Impression  Indication: 27 yr old [id number] at [redacted]w[redacted]d with history of  preterm deliveries and previous finding of mild  polyhydramnios for fetal growth and cervical length.  Findings:  1. Single intrauterine pregnancy with cardiac activity.  2. Estimated fetal weight is in the 48th%.  3. Anterior placenta without evidence of previa.  4. Polyhydramnios with MVP of 8.8 cm.  5. Borderline transvaginal cervical length of 2.5cm; no  funneling is seen.  6. The limited anatomy survey is normal; any  anatomy not  evaluated on today's exam was evaluated on the previous  exam. ---------------------------------------------------------------------- Recommendations  1. Appropriate fetal growth.  2. Fetal anatomic survey is complete.  3. Previous preterm deliveries:  - per chart patient declined 68 OH progesterone  - given borderline cervical length today I recommend offering  vaginal progesterone and repeating cervical length in 1 week  - recommend preterm labor precautions  4. Polyhydramnios:  - unclear etiology  - recommend obtain a glucola  - reevaluate in 1 week  5. Normal quad screen ----------------------------------------------------------------------                Erle Crocker, MD Electronically Signed Final Report   07/23/2017 10:21 pm ----------------------------------------------------------------------    Assessment : GEANIE PACIFICO is a 27 y.o. [id number] at [redacted]w[redacted]d being admitted for preterm labor. H/o prior pretrm delivery (declined 17-P), with antenatal steroids given earlier this pregnancy for preterm contractions.  H/o polyhydramnios in second trimester, h/o substance abuse (THC, tobacco, and cocaine use in early pregnancy), asymptomatic bacteriuria (never treated, per patient as she could not afford medication).  Plan: Labor: Expectant management. Augmentation as ordered as per protocol. Analgesia as needed. Patient desires epidural. Admission labs ordered, including UDS. Neonatology consult for prematurity. Recommend rescue dose of antenatal steroids. Bedside sono today reveals resolution of polyhydramnios, AFI wnl today.  Vertex presentation documented.  FWB: Reassuring fetal heart tracing.  GBS unknown. Had a GBS culture that was negative 2 months ago, however none recent. In light of preterm labor, as well as h/o asymptomatic bacteriuria (E. Coli), will treat with ampicillin.  Delivery plan: Hopeful for vaginal delivery. Discussed with patient that since she did  not sign Medicaid BTL papers and that her Medicaid was not active, she will need to discuss with  [facility] office regarding interval tubal (payment plan vs Medicaid coverage once active). Will give Depo Provera in interim.

## 2017-10-11 DIAGNOSIS — O403XX0 Polyhydramnios, third trimester, not applicable or unspecified: Secondary | ICD-10-CM

## 2017-10-11 LAB — CBC
HEMATOCRIT: 32.6 % — AB (ref 35.0–47.0)
HEMOGLOBIN: 11.6 g/dL — AB (ref 12.0–16.0)
MCH: 33.4 pg (ref 26.0–34.0)
MCHC: 35.7 g/dL (ref 32.0–36.0)
MCV: 93.7 fL (ref 80.0–100.0)
Platelets: 297 10*3/uL (ref 150–440)
RBC: 3.48 MIL/uL — ABNORMAL LOW (ref 3.80–5.20)
RDW: 13.2 % (ref 11.5–14.5)
WBC: 23.2 10*3/uL — ABNORMAL HIGH (ref 3.6–11.0)

## 2017-10-11 LAB — RPR: RPR Ser Ql: NONREACTIVE

## 2017-10-11 LAB — URINE CULTURE: Culture: NO GROWTH

## 2017-10-11 MED ORDER — NICOTINE 14 MG/24HR TD PT24
14.0000 mg | MEDICATED_PATCH | Freq: Every day | TRANSDERMAL | Status: DC
  Administered 2017-10-11 – 2017-10-12 (×2): 14 mg via TRANSDERMAL
  Filled 2017-10-11 (×2): qty 1

## 2017-10-11 MED ORDER — PNEUMOCOCCAL VAC POLYVALENT 25 MCG/0.5ML IJ INJ: 0.5000 mL | INJECTION | INTRAMUSCULAR | Status: DC

## 2017-10-11 MED ORDER — MEDROXYPROGESTERONE ACETATE 150 MG/ML IM SUSP
150.0000 mg | Freq: Once | INTRAMUSCULAR | Status: AC
  Administered 2017-10-12: 150 mg via INTRAMUSCULAR
  Filled 2017-10-11: qty 1

## 2017-10-11 MED ORDER — IBUPROFEN 800 MG PO TABS: 800.0000 mg | ORAL_TABLET | Freq: Three times a day (TID) | ORAL | 0 refills | Status: DC | PRN

## 2017-10-11 MED ORDER — IBUPROFEN 800 MG PO TABS
800.0000 mg | ORAL_TABLET | Freq: Four times a day (QID) | ORAL | Status: DC
  Administered 2017-10-12 (×2): 800 mg via ORAL
  Filled 2017-10-11 (×2): qty 1

## 2017-10-11 NOTE — Clinical Social Work Note (Signed)
CSW received consult that the patient and her infant are both positive for cannabinoids via UDS. CSW will assess when able.  Argentina Ponder, MSW, [facility] [phone]

## 2017-10-11 NOTE — Progress Notes (Signed)
Post Partum Day # 1, s/p SVD  Subjective: no complaints, up ad lib, voiding and tolerating PO  Objective: Temp:  [98 F (36.7 C)-98.3 F (36.8 C)] 98.2 F (36.8 C) (05/18 0730) Pulse Rate:  [46-77] 77 (05/18 0730) Resp:  [16-20] 18 (05/18 0730) BP: (119-147)/(66-93) 123/76 (05/18 0730) SpO2:  [98 %-100 %] 99 % (05/18 0730) Weight:  [165 lb (74.8 kg)] 165 lb (74.8 kg) (05/17 1915)  Physical Exam:  General: alert and no distress  Lungs: clear to auscultation bilaterally Breasts: normal appearance, no masses or tenderness Heart: regular rate and rhythm, S1, S2 normal, no murmur, click, rub or gallop Abdomen: soft, non-tender; bowel sounds normal; no masses,  no organomegaly Pelvis: Lochia: appropriate, Uterine Fundus: firm Extremities: DVT Evaluation: No evidence of DVT seen on physical exam. Negative Homan's sign. No cords or calf tenderness. No significant calf/ankle edema.  Recent Labs    10/10/17 1242 10/11/17 0430  HGB 12.5 11.6*  HCT 36.1 32.6*    Assessment/Plan: Doing well postpartum S/p Social Work consult for positive UDS (MJ+) Circumcision after to discharge Contraception: desires BTL, patient to schedule interval tubal with [facility]. Will use Depo Provera in interim.  Plan for discharge tomorrow   LOS: 1 day   Hildred Laser, MD [facility] 10/11/2017 11:29 AM

## 2017-10-11 NOTE — Clinical Social Work Maternal (Signed)
CLINICAL SOCIAL WORK MATERNAL/CHILD NOTE  Patient Details  Name: SHAWNI VOLKOV MRN: [id number] Date of Birth: [birth year]  Date:  10/11/2017  Clinical Social Worker Initiating Note:  Santiago Bumpers, MSW, [place] Date/Time: Initiated:  10/11/17/1517     Child's Name:  Carlisle Cater    Biological Parents:  Mother, Father   Need for Interpreter:  None   Reason for Referral:  Current Substance Use/Substance Use During Pregnancy , Current CPS Involvement   Address:  [place] [address]    Phone number:  [phone] (home)     Additional phone number: None  Household Members/Support Persons (HM/SP):   Household Member/Support Person 1   HM/SP Name Relationship DOB or Age  HM/SP -1 Trevon Soil scientist 26  HM/SP -2        HM/SP -3        HM/SP -4        HM/SP -5        HM/SP -6        HM/SP -7        HM/SP -8          Natural Supports (not living in the home):  Children, Immediate Family, Extended Family, Medical laboratory scientific officer, Friends   Professional Supports: Case Metallurgist   Employment: Full-time   Type of Work:     Education:  [place] arranged:    Museum/gallery curator Resources:  Self-Pay    Other Resources:      Cultural/Religious Considerations Which May Impact Care:  None noted  Strengths:  Understanding of illness, Compliance with medical plan , Pediatrician chosen   Psychotropic Medications:         Pediatrician:    [place] area  Pediatrician List:   [place])  [place]      Pediatrician Fax Number:    Risk Factors/Current Problems:  Basic Needs , DHHS Involvement    Cognitive State:  Linear Thinking , Insightful , Goal Oriented , Alert    Mood/Affect:  Blunted    CSW Assessment: The CSW met with the patient at bedside to discuss the positive UDS results for cannabis for  both the MOB and infant. The MOB was alert and oriented, and her affect was blunted; however, she was appropriate with her baby and had a variable affect when talking to him. The patient currently has CPS involvement with 3 of her other 4 children. The patient's eldest child Felicity Coyer 28YO F) lives with that child's father. The patient's other three children who have CPS involvement (Mazaryn 29YO M; Nisyani 29YO F; Niaami 29YO F) are temporarily placed with the patient's mother due to an unstable living situation. The patient currently holds 2 jobs and plans to return to work in 3 days to continue working toward securing a stable home. The patient shared that she has found a potential location in [place]. The FOB is also the father of Rolm Baptise and Frann Rider; he also has a job and cohabitates with the patient. The patient denies having used substances; instead, she asserts that she has been around others who were smoking marijuana in an unventilated location. The CSW advised the MOB of mandated reporting which the patient understood.   The CSW has contacted the oncall CPS worker to advise of the situation. CSW is following for any updates. However, the patient is able to discharge with  the child over the weekend should CPS not indicate any differently.  CSW Plan/Description:  Child Protective Service Report     Zettie Pho, Marlinda Mike 10/11/2017, 3:19 PM

## 2017-10-11 NOTE — Plan of Care (Signed)
Vital signs stable; patient's HR is normally 40'2 to 60's; fundus firm; small amount rubra lochia; voiding; tolerating regular diet well; good po fluids; bottle feeding infant with good technique; good maternal-infant bonding observed; patient's husband at bedside and supportive; pain controlled with po motrin.

## 2017-10-12 LAB — CULTURE, BETA STREP (GROUP B ONLY)

## 2017-10-12 NOTE — Clinical Social Work Note (Addendum)
The CSW received a call back from the on-call CPS worker for [facility] to report that the patient's address and open CPS case are in [facility]. CSW is contacting [facility] CPS to make the report. CSW will update as more information is available.  UPDATERosanne Sack from [facility] called this CSW to receive the CPS report. Rosanne Sack is going to staff the situation with her supervisor to look into the safety of the infant discharging with the MOB today. CSW will update as more information is available.  Argentina Ponder, MSW, Theresia Majors [phone]

## 2017-10-12 NOTE — Discharge Summary (Signed)
OB Discharge Summary     Patient Name: Casey Owens DOB: [birth year] MRN: [id number]  Date of admission: 10/10/2017 Delivering MD: Hildred Laser   Date of discharge: 10/12/2017  Admitting diagnosis: contractions 35 wks preg Intrauterine pregnancy: [redacted]w[redacted]d     Secondary diagnosis:  Active Problems:   Preterm labor  Additional problems:   Patient Active Problem List   Diagnosis Date Noted  . Preterm labor 10/10/2017  . Indication for care in labor or delivery 09/21/2017  . Preterm uterine contractions in second trimester, antepartum 08/08/2017  . Polyhydramnios affecting pregnancy 07/30/2017  . Cervical insufficiency during pregnancy, antepartum 07/30/2017  . Asymptomatic bacteriuria during pregnancy in second trimester 05/27/2017  . History of drug use 04/29/2017  . Previous preterm delivery, antepartum 04/29/2017  . Supervision of high-risk pregnancy 04/28/2017  . Tobacco use in pregnancy 02/10/2015       Discharge diagnosis: Preterm Pregnancy Delivered                                                                                                Post partum procedures:None  Augmentation: AROM  Complications: None  Hospital course:  Onset of Labor With Vaginal Delivery     27 y.o. yo [id number] at [redacted]w[redacted]d was admitted in Active Labor on 10/10/2017. Patient had an uncomplicated labor course as follows:  Membrane Rupture Time/Date: 5:22 PM ,10/10/2017   Intrapartum Procedures: Episiotomy: None [1]                                         Lacerations:  None [1]  Patient had a delivery of a Viable infant. 10/10/2017  Information for the patient's newborn:  Deborah Chalk. [[id number]]  Delivery Method: Vag-Spont    Pateint had an uncomplicated postpartum course.  She is ambulating, tolerating a regular diet, passing flatus, and urinating well. Patient is discharged home in stable condition on 10/13/17.   Physical exam  Vitals:   10/11/17 0730 10/11/17 1626  10/11/17 2345 10/12/17 0758  BP: 123/76 118/68 123/88 110/77  Pulse: 77 (!) 55 91 (!) 55  Resp: Temp: 98.2 F (36.8 C) 98.3 F (36.8 C) 98.6 F (37 C) 98 F (36.7 C)  TempSrc: Oral Oral Oral Oral  SpO2: 99% 100% 97% 98%  Weight:      Height:       General: alert and no distress Lochia: appropriate Uterine Fundus: firm Incision: N/A DVT Evaluation: No evidence of DVT seen on physical exam. Negative Homan's sign. No cords or calf tenderness. No significant calf/ankle edema. Labs: Lab Results  Component Value Date   WBC 23.2 (H) 10/11/2017   HGB 11.6 (L) 10/11/2017   HCT 32.6 (L) 10/11/2017   MCV 93.7 10/11/2017   PLT 297 10/11/2017   CMP Latest Ref Rng & Units 08/18/2016  Glucose 65 - 99 mg/dL 87  BUN 6 - 20 mg/dL 7  Creatinine 8.41 - 3.24 mg/dL 4.01  Sodium 027 - 253 mmol/L 136  Potassium 3.5 - 5.1 mmol/L 3.1(L)  Chloride 101 - 111 mmol/L 107  CO2 22 - 32 mmol/L 19(L)  Calcium 8.9 - 10.3 mg/dL 2.7(O)  Total Protein 6.5 - 8.1 g/dL 6.6  Total Bilirubin 0.3 - 1.2 mg/dL 1.1  Alkaline Phos 38 - 126 U/L 127(H)  AST 15 - 41 U/L 49(H)  ALT 14 - 54 U/L 62(H)    Discharge instruction: per After Visit Summary and "Baby and Me Booklet".  After visit meds:  Allergies as of 10/12/2017      Reactions   Shellfish Allergy Swelling      Medication List    STOP taking these medications   cephALEXin 500 MG capsule Commonly known as:  KEFLEX   ondansetron 4 MG disintegrating tablet Commonly known as:  ZOFRAN-ODT   progesterone 200 MG capsule Commonly known as:  PROMETRIUM     TAKE these medications   ibuprofen 800 MG tablet Commonly known as:  ADVIL,MOTRIN Take 1 tablet (800 mg total) by mouth every 8 (eight) hours as needed.   prenatal multivitamin Tabs tablet Take 1 tablet by mouth daily at 12 noon.       Diet: routine diet  Activity: Advance as tolerated. Pelvic rest for 6 weeks.   Outpatient follow up:4 weeks at [facility] at [facility] Follow  up Appt:No future appointments. Follow up Visit:No follow-ups on file.  Postpartum contraception: Depo Provera during interim but plans interval tubal ligation  Newborn Data: Live born female  Birth Weight: 5 lb (2268 g) APGAR: 9, 9  Newborn Delivery   Birth date/time:  10/10/2017 18:15:00 Delivery type:  Vaginal, Spontaneous     Baby Feeding: Bottle Disposition:home with mother   10/12/2017 Hildred Laser, MD

## 2017-10-12 NOTE — Progress Notes (Addendum)
Post Partum Day # 2, s/p SVD  Subjective: no complaints, up ad lib, voiding and tolerating PO  Objective: Vitals:   10/11/17 0730 10/11/17 1626 10/11/17 2345 10/12/17 0758  BP: 123/76 118/68 123/88 110/77  Pulse: 77 (!) 55 91 (!) 55  Resp: Temp: 98.2 F (36.8 C) 98.3 F (36.8 C) 98.6 F (37 C) 98 F (36.7 C)  TempSrc: Oral Oral Oral Oral  SpO2: 99% 100% 97% 98%  Weight:      Height:        Physical Exam:  General: alert and no distress  Lungs: clear to auscultation bilaterally Breasts: normal appearance, no masses or tenderness Heart: regular rate and rhythm, S1, S2 normal, no murmur, click, rub or gallop Abdomen: soft, non-tender; bowel sounds normal; no masses,  no organomegaly Pelvis: Lochia: appropriate, Uterine Fundus: firm Extremities: DVT Evaluation: No evidence of DVT seen on physical exam. Negative Homan's sign. No cords or calf tenderness. No significant calf/ankle edema.  Recent Labs    10/10/17 1242 10/11/17 0430  HGB 12.5 11.6*  HCT 36.1 32.6*    Assessment/Plan: Doing well postpartum S/p Social Work consult for positive UDS (MJ+) Circumcision after to discharge with Pediatrician Contraception: desires BTL, patient to schedule interval tubal with [facility]. Will use Depo Provera in interim. To give prior to discharge.  Currenty with nicotine patch for h/o tobacco use.  Plan for discharge today.   LOS: 2 days   Hildred Laser, MD [facility] 10/12/2017 7:58 AM

## 2017-10-12 NOTE — Progress Notes (Signed)
Pt discharged with infant.  Discharge instructions, prescriptions and follow up appointment given to and reviewed with pt. Pt verbalized understanding. Patient ambulated out with support person.

## 2017-10-15 LAB — SURGICAL PATHOLOGY

## 2017-12-17 ENCOUNTER — Other Ambulatory Visit: Payer: Self-pay

## 2017-12-17 ENCOUNTER — Emergency Department
Admission: EM | Admit: 2017-12-17 | Discharge: 2017-12-17 | Disposition: A | Discharge status: Home / Self Care | Payer: Medicaid Other | Attending: Emergency Medicine | Admitting: Emergency Medicine

## 2017-12-17 ENCOUNTER — Encounter: Payer: Self-pay | Admitting: Emergency Medicine

## 2017-12-17 DIAGNOSIS — Y929 Unspecified place or not applicable: Secondary | ICD-10-CM | POA: Diagnosis not present

## 2017-12-17 DIAGNOSIS — Z79899 Other long term (current) drug therapy: Secondary | ICD-10-CM | POA: Insufficient documentation

## 2017-12-17 DIAGNOSIS — W260XXA Contact with knife, initial encounter: Secondary | ICD-10-CM | POA: Insufficient documentation

## 2017-12-17 DIAGNOSIS — S6981XA Other specified injuries of right wrist, hand and finger(s), initial encounter: Secondary | ICD-10-CM | POA: Diagnosis present

## 2017-12-17 DIAGNOSIS — Y939 Activity, unspecified: Secondary | ICD-10-CM | POA: Diagnosis not present

## 2017-12-17 DIAGNOSIS — Y999 Unspecified external cause status: Secondary | ICD-10-CM | POA: Insufficient documentation

## 2017-12-17 DIAGNOSIS — F17210 Nicotine dependence, cigarettes, uncomplicated: Secondary | ICD-10-CM | POA: Insufficient documentation

## 2017-12-17 DIAGNOSIS — J45909 Unspecified asthma, uncomplicated: Secondary | ICD-10-CM | POA: Diagnosis not present

## 2017-12-17 DIAGNOSIS — Z23 Encounter for immunization: Secondary | ICD-10-CM | POA: Diagnosis not present

## 2017-12-17 DIAGNOSIS — S61212A Laceration without foreign body of right middle finger without damage to nail, initial encounter: Secondary | ICD-10-CM | POA: Insufficient documentation

## 2017-12-17 MED ORDER — TETANUS-DIPHTH-ACELL PERTUSSIS 5-2.5-18.5 LF-MCG/0.5 IM SUSP
0.5000 mL | Freq: Once | INTRAMUSCULAR | Status: AC
  Administered 2017-12-17: 0.5 mL via INTRAMUSCULAR
  Filled 2017-12-17: qty 0.5

## 2017-12-17 NOTE — ED Notes (Signed)
See triage note.

## 2017-12-17 NOTE — ED Triage Notes (Signed)
Patient states she was cutting speaker wires this AM with a pocket knife and cut her right middle finger proximal to DIP.  Bleeding controlled, sterile dressing applied.  Able to move finger.  Also cut area proximal to nail of right index finger - no bleeding noted.

## 2017-12-17 NOTE — ED Provider Notes (Signed)
[facility] Emergency Department Provider Note   ____________________________________________   First MD Initiated Contact with Patient 12/17/17 [phone]     (approximate)  I have reviewed the triage vital signs and the nursing notes.   HISTORY  Chief Complaint Laceration    HPI Casey Owens is a 27 y.o. female patient presents with a laceration to the middle third finger right hand.  Patient states she was cutting speakers wire when the knife slipped and cut her finger.  Patient state bleeding controlled with direct pressure.  Patient denies loss of sensation or loss of function.  Patient tetanus shot is not up-to-date.  Patient rates the pain as a 9/10.  Patient described the pain is "achy".  Patient is right-hand dominant.  Past Medical History:  Diagnosis Date  . Asthma   . Depression   . 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 Preterm labor 10/10/2017  . Indication for care in labor or delivery 09/21/2017  . Preterm uterine contractions in second trimester, antepartum 08/08/2017  . Polyhydramnios affecting pregnancy 07/30/2017  . Cervical insufficiency during pregnancy, antepartum 07/30/2017  . Asymptomatic bacteriuria during pregnancy in second trimester 05/27/2017  . History of drug use 04/29/2017  . Previous preterm delivery, antepartum 04/29/2017  . Supervision of high-risk pregnancy 04/28/2017  . Tobacco use in pregnancy 02/10/2015    Past Surgical History:  Procedure Laterality Date  . NO PAST SURGERIES      Prior to Admission medications   Medication Sig Start Date End Date Taking? Authorizing Provider  ibuprofen (ADVIL,MOTRIN) 800 MG tablet Take 1 tablet (800 mg total) by mouth every 8 (eight) hours as needed. 10/11/17   Hildred Laser, MD  Prenatal Vit-Fe Fumarate-FA (PRENATAL MULTIVITAMIN) TABS tablet Take 1 tablet by mouth  daily at 12 noon.    [provider]    Allergies Shellfish allergy  Family History  Problem Relation Age of Onset  . Hypertension Mother   . Arthritis Paternal Aunt   . Hypertension Maternal Grandmother   . Arthritis Paternal Grandmother     Social History Social History   Tobacco Use  . Smoking status: Current Every Day Smoker    Packs/day: 0.25    Years: 10.00    Pack years: 2.50    Types: Cigarettes  . Smokeless tobacco: Never Used  Substance Use Topics  . Alcohol use: No  . Drug use: No    Comment: says quit drug use in January    Review of Systems  Constitutional: No fever/chills Eyes: No visual changes. ENT: No sore throat. Cardiovascular: Denies chest pain. Respiratory: Denies shortness of breath. Gastrointestinal: No abdominal pain.  No nausea, no vomiting.  No diarrhea.  No constipation. Genitourinary: Negative for dysuria. Musculoskeletal: Negative for back pain. Skin: Negative for rash.  Laceration third digit right hand. Neurological: Negative for headaches, focal weakness or numbness. Psychiatric:Substance abuse ____________________________________________   PHYSICAL EXAM:  VITAL SIGNS: ED Triage Vitals  Enc Vitals Group     BP --      Pulse --      Resp --      Temp --      Temp src --      SpO2 --      Weight 12/17/17 0732 135 lb (61.2 kg)     Height 12/17/17 0732 5\' 4"  (1.626 m)     Head Circumference --  Peak Flow --      Pain Score 12/17/17 0731 7     Pain Loc --      Pain Edu? --      Excl. in GC? --    Constitutional: Alert and oriented. Well appearing and in no acute distress. Hematological/Lymphatic/Immunilogical: No cervical lymphadenopathy. Cardiovascular: Normal rate, regular rhythm. Grossly normal heart sounds.  Good peripheral circulation. Respiratory: Normal respiratory effort.  No retractions. Lungs CTAB. Neurologic:  Normal speech and language. No gross focal neurologic deficits are appreciated. No gait  instability. Skin:  Skin is warm, dry and intact. No rash noted.  1 cm laceration palmar aspect of the third digit right hand. Psychiatric: Mood and affect are normal. Speech and behavior are normal.  ____________________________________________   LABS (all labs ordered are listed, but only abnormal results are displayed)  Labs Reviewed - No data to display ____________________________________________  EKG   ____________________________________________  RADIOLOGY  ED MD interpretation:    Official radiology report(s): No results found.  ____________________________________________   PROCEDURES  Procedure(s) performed: None  .Marland KitchenLaceration Repair Date/Time: 12/17/2017 7:55 AM Performed by: Joni Reining, PA-C Authorized by: Joni Reining, PA-C   Consent:    Consent obtained:  Verbal   Consent given by:  Patient   Risks discussed:  Infection, pain, poor wound healing and poor cosmetic result Anesthesia (see MAR for exact dosages):    Anesthesia method:  None Laceration details:    Location:  Finger   Finger location:  R long finger   Length (cm):  1 Repair type:    Repair type:  Simple Pre-procedure details:    Preparation:  Patient was prepped and draped in usual sterile fashion Exploration:    Hemostasis achieved with:  Direct pressure Treatment:    Area cleansed with:  Betadine and saline   Amount of cleaning:  Standard Skin repair:    Repair method:  Tissue adhesive Approximation:    Approximation:  Close Post-procedure details:    Dressing:  Splint for protection   Patient tolerance of procedure:  Tolerated well, no immediate complications .Splint Application Date/Time: 12/17/2017 7:56 AM Performed by: Nyoka Lint, NT Authorized by: Joni Reining, PA-C   Consent:    Consent obtained:  Verbal   Consent given by:  Patient   Risks discussed:  Swelling and pain Pre-procedure details:    Sensation:  Normal Procedure details:    Laterality:   Right   Location:  Finger   Finger:  R long finger   Supplies:  Aluminum splint Post-procedure details:    Pain:  Unchanged   Sensation:  Normal   Patient tolerance of procedure:  Tolerated well, no immediate complications    Critical Care performed: No  ____________________________________________   INITIAL IMPRESSION / ASSESSMENT AND PLAN / ED COURSE  As part of my medical decision making, I reviewed the following data within the electronic MEDICAL RECORD NUMBER    Laceration to third digit left hand.  Area was surgically clean and closed with Dermabond.  Patient was placed in a splint secondary to the flexor location of the incision.  Patient given discharge care instructions.  Patient advised to return to ED if wound reopens before healing is complete.      ____________________________________________   FINAL CLINICAL IMPRESSION(S) / ED DIAGNOSES  Final diagnoses:  Laceration of right middle finger without foreign body without damage to nail, initial encounter     ED Discharge Orders    None  Note:  This document was prepared using Dragon voice recognition software and may include unintentional dictation errors.    Joni ReiningSmith, Ronald K, PA-C 12/17/17 [phone]    Minna AntisPaduchowski, Kevin, MD 12/17/17 [phone]

## 2017-12-17 NOTE — Discharge Instructions (Addendum)
Finger splint for 2 to 3 days.  Follow discharge care instructions.  No creams or ointment to the Dermabond area.

## 2018-04-01 ENCOUNTER — Other Ambulatory Visit: Payer: Self-pay

## 2018-04-01 ENCOUNTER — Encounter: Payer: Self-pay | Admitting: Emergency Medicine

## 2018-04-01 ENCOUNTER — Emergency Department
Admission: EM | Admit: 2018-04-01 | Discharge: 2018-04-01 | Disposition: A | Discharge status: Home / Self Care | Payer: Medicaid Other | Attending: Emergency Medicine | Admitting: Emergency Medicine

## 2018-04-01 DIAGNOSIS — J069 Acute upper respiratory infection, unspecified: Secondary | ICD-10-CM | POA: Insufficient documentation

## 2018-04-01 DIAGNOSIS — J45909 Unspecified asthma, uncomplicated: Secondary | ICD-10-CM | POA: Insufficient documentation

## 2018-04-01 DIAGNOSIS — B9789 Other viral agents as the cause of diseases classified elsewhere: Secondary | ICD-10-CM

## 2018-04-01 DIAGNOSIS — F17210 Nicotine dependence, cigarettes, uncomplicated: Secondary | ICD-10-CM | POA: Insufficient documentation

## 2018-04-01 LAB — INFLUENZA PANEL BY PCR (TYPE A & B)
INFLAPCR: NEGATIVE
INFLBPCR: NEGATIVE

## 2018-04-01 MED ORDER — HYDROCOD POLST-CPM POLST ER 10-8 MG/5ML PO SUER
5.0000 mL | Freq: Once | ORAL | Status: AC
  Administered 2018-04-01: 5 mL via ORAL
  Filled 2018-04-01: qty 5

## 2018-04-01 MED ORDER — IBUPROFEN 600 MG PO TABS: 600.0000 mg | ORAL_TABLET | Freq: Three times a day (TID) | ORAL | 0 refills | Status: DC | PRN

## 2018-04-01 MED ORDER — ALBUTEROL SULFATE HFA 108 (90 BASE) MCG/ACT IN AERS: 2.0000 | INHALATION_SPRAY | Freq: Four times a day (QID) | RESPIRATORY_TRACT | 2 refills | Status: DC | PRN

## 2018-04-01 MED ORDER — BENZONATATE 100 MG PO CAPS: 200.0000 mg | ORAL_CAPSULE | Freq: Three times a day (TID) | ORAL | 0 refills | Status: DC | PRN

## 2018-04-01 MED ORDER — FEXOFENADINE-PSEUDOEPHED ER 60-120 MG PO TB12: 1.0000 | ORAL_TABLET | Freq: Two times a day (BID) | ORAL | 0 refills | Status: DC

## 2018-04-01 MED ORDER — BUDESONIDE 90 MCG/ACT IN AEPB: 1.0000 | INHALATION_SPRAY | Freq: Two times a day (BID) | RESPIRATORY_TRACT | 2 refills | Status: DC

## 2018-04-01 NOTE — ED Provider Notes (Signed)
[facility] Emergency Department Provider Note   ____________________________________________   First MD Initiated Contact with Patient 04/01/18 [phone]     (approximate)  I have reviewed the triage vital signs and the nursing notes.   HISTORY  Chief Complaint Cough and Sore Throat    HPI Casey Owens is a 27 y.o. female patient presents with acute onset of coughing, sneezing, body aches, sore throat, and nausea.  Patient denies vomiting or diarrhea.  Patient also requesting refill of her asthma medications.  Patient rates the pain discomfort as 8/10.  Patient described the pain is "achy".  No palliative measure for complaint.  Past Medical History:  Diagnosis Date  . Asthma   . Depression   . 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 Preterm labor 10/10/2017  . Indication for care in labor or delivery 09/21/2017  . Preterm uterine contractions in second trimester, antepartum 08/08/2017  . Polyhydramnios affecting pregnancy 07/30/2017  . Cervical insufficiency during pregnancy, antepartum 07/30/2017  . Asymptomatic bacteriuria during pregnancy in second trimester 05/27/2017  . History of drug use 04/29/2017  . Previous preterm delivery, antepartum 04/29/2017  . Supervision of high-risk pregnancy 04/28/2017  . Tobacco use in pregnancy 02/10/2015    Past Surgical History:  Procedure Laterality Date  . NO PAST SURGERIES      Prior to Admission medications   Medication Sig Start Date End Date Taking? Authorizing Provider  albuterol (PROVENTIL HFA;VENTOLIN HFA) 108 (90 Base) MCG/ACT inhaler Inhale 2 puffs into the lungs every 6 (six) hours as needed for wheezing or shortness of breath. 04/01/18   Joni Reining, PA-C  benzonatate (TESSALON PERLES) 100 MG capsule Take 2 capsules (200 mg total) by mouth 3 (three) times daily as needed. 04/01/18  04/01/19  Joni Reining, PA-C  Budesonide (PULMICORT FLEXHALER) 90 MCG/ACT inhaler Inhale 1 puff into the lungs 2 (two) times daily. 04/01/18   Joni Reining, PA-C  fexofenadine-pseudoephedrine (ALLEGRA-D) 60-120 MG 12 hr tablet Take 1 tablet by mouth 2 (two) times daily. 04/01/18   Joni Reining, PA-C  fexofenadine-pseudoephedrine (ALLEGRA-D) 60-120 MG 12 hr tablet Take 1 tablet by mouth 2 (two) times daily. 04/01/18   Joni Reining, PA-C  ibuprofen (ADVIL,MOTRIN) 600 MG tablet Take 1 tablet (600 mg total) by mouth every 8 (eight) hours as needed. 04/01/18   Joni Reining, PA-C  ibuprofen (ADVIL,MOTRIN) 800 MG tablet Take 1 tablet (800 mg total) by mouth every 8 (eight) hours as needed. 10/11/17   Hildred Laser, MD  Prenatal Vit-Fe Fumarate-FA (PRENATAL MULTIVITAMIN) TABS tablet Take 1 tablet by mouth daily at 12 noon.    [provider]    Allergies Shellfish allergy  Family History  Problem Relation Age of Onset  . Hypertension Mother   . Arthritis Paternal Aunt   . Hypertension Maternal Grandmother   . Arthritis Paternal Grandmother     Social History Social History   Tobacco Use  . Smoking status: Current Every Day Smoker    Packs/day: 0.25    Years: 10.00    Pack years: 2.50    Types: Cigarettes  . Smokeless tobacco: Never Used  Substance Use Topics  . Alcohol use: No  . Drug use: No    Comment: says quit drug use in January    Review of Systems Constitutional: Fever/chills.  Body aches. Eyes: No visual changes.  ENT: Nasal congestion sore throat. Cardiovascular: Denies chest pain. Respiratory: Denies shortness of breath.  Cough and sneezing. Gastrointestinal: No abdominal pain.  Nausea without vomiting.  No diarrhea.  No constipation. Genitourinary: Negative for dysuria. Musculoskeletal: Negative for back pain. Skin: Negative for rash. Neurological: Negative for headaches, focal weakness or numbness. Psychiatric:Depression Allergic/Immunilogical:  Shellfish. ____________________________________________   PHYSICAL EXAM:  VITAL SIGNS: ED Triage Vitals [04/01/18 0715]  Enc Vitals Group     BP 123/88     Pulse Rate 92     Resp 18     Temp 99.4 F (37.4 C)     Temp Source Oral     SpO2 97 %     Weight 127 lb (57.6 kg)     Height 5\' 4"  (1.626 m)     Head Circumference      Peak Flow      Pain Score 8     Pain Loc      Pain Edu?      Excl. in GC?    Constitutional: Alert and oriented. Well appearing and in no acute distress. Eyes: Conjunctivae are normal. PERRL. EOMI. Head: Atraumatic. Nose: Edematous nasal turbinates clear rhinorrhea. Mouth/Throat: Mucous membranes are moist.  Oropharynx erythematous.  Postnasal drainage. Neck: No stridor.  No cervical lymphadenopathy. Cardiovascular: Normal rate, regular rhythm. Grossly normal heart sounds.  Good peripheral circulation. Respiratory: Normal respiratory effort.  No retractions. Lungs CTAB. Gastrointestinal: Soft and nontender. No distention. No abdominal bruits. No CVA tenderness. Neurologic:  Normal speech and language. No gross focal neurologic deficits are appreciated. No gait instability. Skin:  Skin is warm, dry and intact. No rash noted. Psychiatric: Mood and affect are normal. Speech and behavior are normal.  ____________________________________________   LABS (all labs ordered are listed, but only abnormal results are displayed)  Labs Reviewed  INFLUENZA PANEL BY PCR (TYPE A & B)   ____________________________________________  EKG   ____________________________________________  RADIOLOGY  ED MD interpretation:    Official radiology report(s): No results found.  ____________________________________________   PROCEDURES  Procedure(s) performed: None  Procedures  Critical Care performed: No  ____________________________________________   INITIAL IMPRESSION / ASSESSMENT AND PLAN / ED COURSE  As part of my medical decision making, I  reviewed the following data within the electronic MEDICAL RECORD NUMBER    Patient presents for acute onset of cough, sneezing cough body ache, sore throat.  Patient complaining physical findings consistent with viral history infection.  Discussed negative flu results with patient.  Patient given discharge care instructions and a work note.  Take medication as directed.  Patient also requests medication refill for Pulmicort and Ventolin.      ____________________________________________   FINAL CLINICAL IMPRESSION(S) / ED DIAGNOSES  Final diagnoses:  Viral URI with cough     ED Discharge Orders         Ordered    Budesonide (PULMICORT FLEXHALER) 90 MCG/ACT inhaler  2 times daily,   Status:  Discontinued     04/01/18 0812    albuterol (PROVENTIL HFA;VENTOLIN HFA) 108 (90 Base) MCG/ACT inhaler  Every 6 hours PRN,   Status:  Discontinued     04/01/18 0812    fexofenadine-pseudoephedrine (ALLEGRA-D) 60-120 MG 12 hr tablet  2 times daily     04/01/18 0832    Budesonide (PULMICORT FLEXHALER) 90 MCG/ACT inhaler  2 times daily     04/01/18 0835    albuterol (PROVENTIL HFA;VENTOLIN HFA) 108 (90 Base) MCG/ACT inhaler  Every 6 hours PRN  04/01/18 0835    fexofenadine-pseudoephedrine (ALLEGRA-D) 60-120 MG 12 hr tablet  2 times daily     04/01/18 0835    benzonatate (TESSALON PERLES) 100 MG capsule  3 times daily PRN     04/01/18 0835    ibuprofen (ADVIL,MOTRIN) 600 MG tablet  Every 8 hours PRN     04/01/18 0835           Note:  This document was prepared using Dragon voice recognition software and may include unintentional dictation errors.    Joni Reining, PA-C 04/01/18 0272    Rockne Menghini, MD 04/01/18 1431

## 2018-04-01 NOTE — ED Notes (Signed)
See triage note  States she developed body aches ,cough and sore throat yesterday  Subjective fever at home  Low grade fever noted on arrival   Also states she is out of inhalers

## 2018-04-01 NOTE — ED Triage Notes (Signed)
Pt states yesterday she started coughing, sneezing, body aches, throat pain, appears in NAD.

## 2018-04-17 ENCOUNTER — Emergency Department: Payer: Self-pay

## 2018-04-17 ENCOUNTER — Emergency Department
Admission: EM | Admit: 2018-04-17 | Discharge: 2018-04-17 | Disposition: A | Discharge status: Home / Self Care | Payer: Self-pay | Attending: Student in an Organized Health Care Education/Training Program | Admitting: Student in an Organized Health Care Education/Training Program

## 2018-04-17 ENCOUNTER — Encounter: Payer: Self-pay | Admitting: Emergency Medicine

## 2018-04-17 ENCOUNTER — Other Ambulatory Visit: Payer: Self-pay

## 2018-04-17 DIAGNOSIS — Z79899 Other long term (current) drug therapy: Secondary | ICD-10-CM | POA: Insufficient documentation

## 2018-04-17 DIAGNOSIS — N939 Abnormal uterine and vaginal bleeding, unspecified: Secondary | ICD-10-CM | POA: Insufficient documentation

## 2018-04-17 DIAGNOSIS — I10 Essential (primary) hypertension: Secondary | ICD-10-CM | POA: Insufficient documentation

## 2018-04-17 DIAGNOSIS — F17210 Nicotine dependence, cigarettes, uncomplicated: Secondary | ICD-10-CM | POA: Insufficient documentation

## 2018-04-17 LAB — POCT PREGNANCY, URINE: Preg Test, Ur: NEGATIVE

## 2018-04-17 LAB — CBC
HCT: 41.4 % (ref 36.0–46.0)
Hemoglobin: 14 g/dL (ref 12.0–15.0)
MCH: 32 pg (ref 26.0–34.0)
MCHC: 33.8 g/dL (ref 30.0–36.0)
MCV: 94.7 fL (ref 80.0–100.0)
NRBC: 0 % (ref 0.0–0.2)
Platelets: 383 10*3/uL (ref 150–400)
RBC: 4.37 MIL/uL (ref 3.87–5.11)
RDW: 13 % (ref 11.5–15.5)
WBC: 9.3 10*3/uL (ref 4.0–10.5)

## 2018-04-17 NOTE — ED Notes (Signed)
Pelvic cart at bedside. Urine specimen sent.

## 2018-04-17 NOTE — ED Triage Notes (Signed)
Patient reports that she hasn't had menstrual cycle since giving birth 6 months ago. Reports yesterday she started having vaginal bleeding with clots. This morning she states she has gone through 10 pads since getting out of bed. Denies abdominal cramping or pain.

## 2018-04-17 NOTE — ED Provider Notes (Signed)
[facility] Emergency Department Provider Note    First MD Initiated Contact with Patient 04/17/18 0720     (approximate)  I have reviewed the triage vital signs and the nursing notes.   HISTORY  Chief Complaint Vaginal Bleeding    HPI Aracelly Gentry RochS Emmer is a 27 y.o. female roughly 6 months post vaginal delivery G5, P5 presents the ER with heavy vaginal bleeding.  States she denies any history of any disorder is not on any anticoagulation.  Denies any trauma.  No pain.  States that she is soaking through 3-4 tampons or pads for the past several hours.  Denies any nausea or vomiting.  There are no complications after her previous delivery.  Is not on any birth control.    Past Medical History:  Diagnosis Date  . Asthma   . Depression   . 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 Hypertension Mother   . Arthritis Paternal Aunt   . Hypertension Maternal Grandmother   . Arthritis Paternal Grandmother    Past Surgical History:  Procedure Laterality Date  . NO PAST SURGERIES     Patient Active Problem List   Diagnosis Date Noted  . Preterm labor 10/10/2017  . Indication for care in labor or delivery 09/21/2017  . Preterm uterine contractions in second trimester, antepartum 08/08/2017  . Polyhydramnios affecting pregnancy 07/30/2017  . Cervical insufficiency during pregnancy, antepartum 07/30/2017  . Asymptomatic bacteriuria during pregnancy in second trimester 05/27/2017  . History of drug use 04/29/2017  . Previous preterm delivery, antepartum 04/29/2017  . Supervision of high-risk pregnancy 04/28/2017  . Tobacco use in pregnancy 02/10/2015      Prior to Admission medications   Medication Sig Start Date End Date Taking? Authorizing Provider  albuterol (PROVENTIL HFA;VENTOLIN HFA) 108 (90 Base) MCG/ACT inhaler Inhale 2 puffs into the lungs every 6  (six) hours as needed for wheezing or shortness of breath. 04/01/18  Yes Joni ReiningSmith, Ronald K, PA-C  Budesonide (PULMICORT FLEXHALER) 90 MCG/ACT inhaler Inhale 1 puff into the lungs 2 (two) times daily. 04/01/18  Yes Joni ReiningSmith, Ronald K, PA-C  benzonatate (TESSALON PERLES) 100 MG capsule Take 2 capsules (200 mg total) by mouth 3 (three) times daily as needed. Patient not taking: Reported on 04/17/2018 04/01/18 04/01/19  Joni ReiningSmith, Ronald K, PA-C  fexofenadine-pseudoephedrine (ALLEGRA-D) 60-120 MG 12 hr tablet Take 1 tablet by mouth 2 (two) times daily. Patient not taking: Reported on 04/17/2018 04/01/18   Joni ReiningSmith, Ronald K, PA-C  fexofenadine-pseudoephedrine (ALLEGRA-D) 60-120 MG 12 hr tablet Take 1 tablet by mouth 2 (two) times daily. Patient not taking: Reported on 04/17/2018 04/01/18   Joni ReiningSmith, Ronald K, PA-C  ibuprofen (ADVIL,MOTRIN) 600 MG tablet Take 1 tablet (600 mg total) by mouth every 8 (eight) hours as needed. Patient not taking: Reported on 04/17/2018 04/01/18   Joni ReiningSmith, Ronald K, PA-C  ibuprofen (ADVIL,MOTRIN) 800 MG tablet Take 1 tablet (800 mg total) by mouth every 8 (eight) hours as needed. Patient not taking: Reported on 04/17/2018 10/11/17   Hildred Laserherry, Anika, MD  Prenatal Vit-Fe Fumarate-FA (PRENATAL MULTIVITAMIN) TABS tablet Take 1 tablet by mouth daily at 12 noon.    [provider]    Allergies Shellfish allergy    Social History Social History   Tobacco Use  . Smoking status: Current Every Day Smoker    Packs/day: 0.25    Years: 10.00  Pack years: 2.50    Types: Cigarettes  . Smokeless tobacco: Never Used  Substance Use Topics  . Alcohol use: No  . Drug use: No    Comment: says quit drug use in January    Review of Systems Patient denies headaches, rhinorrhea, blurry vision, numbness, shortness of breath, chest pain, edema, cough, abdominal pain, nausea, vomiting, diarrhea, dysuria, fevers, rashes or hallucinations unless otherwise stated above in  HPI. ____________________________________________   PHYSICAL EXAM:  VITAL SIGNS: Vitals:   04/17/18 0706  BP: 124/72  Pulse: (!) 57  Resp: 18  Temp: 97.7 F (36.5 C)  SpO2: 99%    Constitutional: Alert and oriented.  Eyes: Conjunctivae are normal.  Head: Atraumatic. Nose: No congestion/rhinnorhea. Mouth/Throat: Mucous membranes are moist.   Neck: No stridor. Painless ROM.  Cardiovascular: Normal rate, regular rhythm. Grossly normal heart sounds.  Good peripheral circulation. Respiratory: Normal respiratory effort.  No retractions. Lungs CTAB. Gastrointestinal: Soft and nontender. No distention. No abdominal bruits. No CVA tenderness. Genitourinary: Small amount of oozing blood coming from close cervical loss.  Small nonbleeding cervical abnormality located at 12:00.  Possible nabothian cyst Musculoskeletal: No lower extremity tenderness nor edema.  No joint effusions. Neurologic:  Normal speech and language. No gross focal neurologic deficits are appreciated. No facial droop Skin:  Skin is warm, dry and intact. No rash noted. Psychiatric: Mood and affect are normal. Speech and behavior are normal.  ____________________________________________   LABS (all labs ordered are listed, but only abnormal results are displayed)  Results for orders placed or performed during the hospital encounter of 04/17/18 (from the past 24 hour(s))  CBC     Status: None   Collection Time: 04/17/18  7:36 AM  Result Value Ref Range   WBC 9.3 4.0 - 10.5 K/uL   RBC 4.37 3.87 - 5.11 MIL/uL   Hemoglobin 14.0 12.0 - 15.0 g/dL   HCT 52.8 41.3 - 24.4 %   MCV 94.7 80.0 - 100.0 fL   MCH 32.0 26.0 - 34.0 pg   MCHC 33.8 30.0 - 36.0 g/dL   RDW 01.0 27.2 - 53.6 %   Platelets 383 150 - 400 K/uL   nRBC 0.0 0.0 - 0.2 %  Pregnancy, urine POC     Status: None   Collection Time: 04/17/18  7:49 AM  Result Value Ref Range   Preg Test, Ur NEGATIVE NEGATIVE    ____________________________________________  ____________________________________________  RADIOLOGY  I personally reviewed all radiographic images ordered to evaluate for the above acute complaints and reviewed radiology reports and findings.  These findings were personally discussed with the patient.  Please see medical record for radiology report.  ____________________________________________   PROCEDURES  Procedure(s) performed:  Procedures    Critical Care performed: no ____________________________________________   INITIAL IMPRESSION / ASSESSMENT AND PLAN / ED COURSE  Pertinent labs & imaging results that were available during my care of the patient were reviewed by me and considered in my medical decision making (see chart for details).   DDX: AB, DOB, ectopic, mass, trauma  Hana Kathie Rhodes Cothern is a 27 y.o. who presents to the ED with symptoms as described above.  Patient nontoxic-appearing.  Hemodynamically stable.  Ultrasound to exclude mass lesion.  Patient is not pregnant therefore not consistent with ectopic.  The patient will be placed on continuous pulse oximetry and telemetry for monitoring.  Laboratory evaluation will be sent to evaluate for the above complaints.     Clinical Course as of Apr 17 942  Fri Apr 17, 2018  1610 Pelvic exam shows only mild bleeding.  No evidence of active hemorrhage.  Faint amount of slow oozing coming from closed cervical loss.   [PR]  [id number] Ultrasound reassuring.  Discussed results with patient.  Discussed options for TXA or OCP but patient does smoke and does have family history of clotting disorder resulting in DVT therefore patient at higher risk.  She is not having any evidence of hemodynamic instability or massive or heavy bleeding at this point do feel that a period of observation and outpatient follow-up is appropriate.   [PR]    Clinical Course User Index [PR] Willy Eddy, MD     As part of my medical decision  making, I reviewed the following data within the electronic MEDICAL RECORD NUMBER Nursing notes reviewed and incorporated, Labs reviewed, notes from prior ED visits. ____________________________________________   FINAL CLINICAL IMPRESSION(S) / ED DIAGNOSES  Final diagnoses:  Episode of heavy vaginal bleeding      NEW MEDICATIONS STARTED DURING THIS VISIT:  New Prescriptions   No medications on file     Note:  This document was prepared using Dragon voice recognition software and may include unintentional dictation errors.    Willy Eddy, MD 04/17/18 [phone]

## 2018-04-17 NOTE — ED Notes (Signed)
Patient reports rectal bleed that began few days ago after bowel movement, now also bleeding from vagina area. Reports s/p delivery of baby 6 months ago. Does not know if she is finally getting her first menstrual cycle since dilivery. Patient states wetting 4 pads an hour. Denies pain with bleeding.

## 2018-05-25 ENCOUNTER — Encounter: Payer: Self-pay | Admitting: Emergency Medicine

## 2018-05-25 ENCOUNTER — Emergency Department
Admission: EM | Admit: 2018-05-25 | Discharge: 2018-05-25 | Disposition: A | Discharge status: Home / Self Care | Payer: Self-pay | Attending: Emergency Medicine | Admitting: Emergency Medicine

## 2018-05-25 ENCOUNTER — Other Ambulatory Visit: Payer: Self-pay

## 2018-05-25 DIAGNOSIS — J111 Influenza due to unidentified influenza virus with other respiratory manifestations: Secondary | ICD-10-CM | POA: Insufficient documentation

## 2018-05-25 DIAGNOSIS — R05 Cough: Secondary | ICD-10-CM | POA: Insufficient documentation

## 2018-05-25 DIAGNOSIS — M7918 Myalgia, other site: Secondary | ICD-10-CM | POA: Insufficient documentation

## 2018-05-25 DIAGNOSIS — J45909 Unspecified asthma, uncomplicated: Secondary | ICD-10-CM | POA: Insufficient documentation

## 2018-05-25 DIAGNOSIS — F17210 Nicotine dependence, cigarettes, uncomplicated: Secondary | ICD-10-CM | POA: Insufficient documentation

## 2018-05-25 LAB — INFLUENZA PANEL BY PCR (TYPE A & B)
INFLBPCR: POSITIVE — AB
Influenza A By PCR: NEGATIVE

## 2018-05-25 MED ORDER — OSELTAMIVIR PHOSPHATE 75 MG PO CAPS: 75.0000 mg | ORAL_CAPSULE | Freq: Two times a day (BID) | ORAL | 0 refills | Status: DC

## 2018-05-25 MED ORDER — ONDANSETRON 4 MG PO TBDP
4.0000 mg | ORAL_TABLET | Freq: Once | ORAL | Status: AC
  Administered 2018-05-25: 4 mg via ORAL
  Filled 2018-05-25: qty 1

## 2018-05-25 MED ORDER — ACETAMINOPHEN 500 MG PO TABS
1000.0000 mg | ORAL_TABLET | Freq: Once | ORAL | Status: AC
  Administered 2018-05-25: 1000 mg via ORAL
  Filled 2018-05-25: qty 2

## 2018-05-25 MED ORDER — ONDANSETRON 4 MG PO TBDP: 4.0000 mg | ORAL_TABLET | Freq: Three times a day (TID) | ORAL | 0 refills | Status: DC | PRN

## 2018-05-25 MED ORDER — OSELTAMIVIR PHOSPHATE 75 MG PO CAPS: 75.0000 mg | ORAL_CAPSULE | Freq: Two times a day (BID) | ORAL | 0 refills | Status: AC

## 2018-05-25 NOTE — ED Provider Notes (Signed)
-----------------------------------------   11:38 AM on 05/25/2018 -----------------------------------------  Patient seen and evaluated in conjunction with physician assistant Greig RightSusan Fisher.  On my evaluation overall patient appears well, largely clear lung sounds.  Patient has nausea vomiting headache, fever, cough and congestion.  Symptoms are very consistent with influenza.  Patient is here with her 6516-month-old son who has had similar symptoms for the past 2 days, as well as the child's father who is had similar symptoms over the past 3 to 4 days.  Patient lung sounds are clear.  Patient had low-grade temperature 99.9 upon arrival.  We will check influenza PCR.  PCR positive for influenza B.  Patient did not get her flu shot this year.  We will dose Tylenol, Zofran and discharge with Zofran and Tamiflu.  Patient agreeable to plan of care.   Minna AntisPaduchowski, Dava Rensch, MD 05/25/18 1140

## 2018-05-25 NOTE — ED Notes (Signed)
Pt alert and oriented X4, active, cooperative, pt in NAD. RR even and unlabored, color WNL.  Pt informed to return if any life threatening symptoms occur.  Discharge and followup instructions reviewed. ambulates safely. 

## 2018-05-25 NOTE — ED Provider Notes (Signed)
[facility] Emergency Department Provider Note  ____________________________________________   First MD Initiated Contact with Patient 05/25/18 [phone]     (approximate)  I have reviewed the triage vital signs and the nursing notes.   HISTORY  Chief Complaint Generalized Body Aches    HPI Casey Owens is a 27 y.o. femaleflulike symptoms, patient is complained of fever, chills, body aches.  Positive cough, mild sore throat, denies vomiting, denies diarrhea; denies chest pain or sob.  Sx for 1 days; she states multiple family members have had the same illness.  She is mostly concerned about the child she brought in today.   Past Medical History:  Diagnosis Date  . Asthma   . Depression   . 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 Preterm labor 10/10/2017  . Indication for care in labor or delivery 09/21/2017  . Preterm uterine contractions in second trimester, antepartum 08/08/2017  . Polyhydramnios affecting pregnancy 07/30/2017  . Cervical insufficiency during pregnancy, antepartum 07/30/2017  . Asymptomatic bacteriuria during pregnancy in second trimester 05/27/2017  . History of drug use 04/29/2017  . Previous preterm delivery, antepartum 04/29/2017  . Supervision of high-risk pregnancy 04/28/2017  . Tobacco use in pregnancy 02/10/2015    Past Surgical History:  Procedure Laterality Date  . NO PAST SURGERIES      Prior to Admission medications   Medication Sig Start Date End Date Taking? Authorizing Provider  albuterol (PROVENTIL HFA;VENTOLIN HFA) 108 (90 Base) MCG/ACT inhaler Inhale 2 puffs into the lungs every 6 (six) hours as needed for wheezing or shortness of breath. 04/01/18   Joni Reining, PA-C  benzonatate (TESSALON PERLES) 100 MG capsule Take 2 capsules (200 mg total) by mouth 3 (three) times daily as needed. Patient not  taking: Reported on 04/17/2018 04/01/18 04/01/19  Joni Reining, PA-C  Budesonide (PULMICORT FLEXHALER) 90 MCG/ACT inhaler Inhale 1 puff into the lungs 2 (two) times daily. 04/01/18   Joni Reining, PA-C  fexofenadine-pseudoephedrine (ALLEGRA-D) 60-120 MG 12 hr tablet Take 1 tablet by mouth 2 (two) times daily. Patient not taking: Reported on 04/17/2018 04/01/18   Joni Reining, PA-C  fexofenadine-pseudoephedrine (ALLEGRA-D) 60-120 MG 12 hr tablet Take 1 tablet by mouth 2 (two) times daily. Patient not taking: Reported on 04/17/2018 04/01/18   Joni Reining, PA-C  ibuprofen (ADVIL,MOTRIN) 600 MG tablet Take 1 tablet (600 mg total) by mouth every 8 (eight) hours as needed. Patient not taking: Reported on 04/17/2018 04/01/18   Joni Reining, PA-C  ibuprofen (ADVIL,MOTRIN) 800 MG tablet Take 1 tablet (800 mg total) by mouth every 8 (eight) hours as needed. Patient not taking: Reported on 04/17/2018 10/11/17   Hildred Laser, MD  ondansetron (ZOFRAN ODT) 4 MG disintegrating tablet Take 1 tablet (4 mg total) by mouth every 8 (eight) hours as needed for nausea or vomiting. 05/25/18   Minna Antis, MD  oseltamivir (TAMIFLU) 75 MG capsule Take 1 capsule (75 mg total) by mouth 2 (two) times daily for 5 days. 05/25/18 05/30/18  Minna Antis, MD  Prenatal Vit-Fe Fumarate-FA (PRENATAL MULTIVITAMIN) TABS tablet Take 1 tablet by mouth daily at 12 noon.    [provider]    Allergies Shellfish allergy  Family History  Problem Relation Age of Onset  . Hypertension Mother   . Arthritis Paternal Aunt   . Hypertension Maternal Grandmother   .  Arthritis Paternal Grandmother     Social History Social History   Tobacco Use  . Smoking status: Current Every Day Smoker    Packs/day: 0.25    Years: 10.00    Pack years: 2.50    Types: Cigarettes  . Smokeless tobacco: Never Used  Substance Use Topics  . Alcohol use: No  . Drug use: No    Comment: says quit drug use in January     Review of Systems  Constitutional: Positive fever/chills Eyes: No visual changes. ENT: Positive sore throat. Respiratory: Positive cough Genitourinary: Negative for dysuria. Musculoskeletal: Negative for back pain. Skin: Negative for rash.    ____________________________________________   PHYSICAL EXAM:  VITAL SIGNS: ED Triage Vitals [05/25/18 1033]  Enc Vitals Group     BP      Pulse      Resp      Temp      Temp src      SpO2      Weight 127 lb (57.6 kg)     Height 5\' 4"  (1.626 m)     Head Circumference      Peak Flow      Pain Score 7     Pain Loc      Pain Edu?      Excl. in GC?     Constitutional: Alert and oriented. Well appearing and in no acute distress. Eyes: Conjunctivae are normal.  Head: Atraumatic. Nose: No congestion/rhinnorhea. Mouth/Throat: Mucous membranes are moist.   Neck:  supple no lymphadenopathy noted Cardiovascular: Normal rate, regular rhythm. Heart sounds are normal Respiratory: Normal respiratory effort.  No retractions, lungs c t a, cough is dry and harsh GU: deferred Musculoskeletal: FROM all extremities, warm and well perfused Neurologic:  Normal speech and language.  Skin:  Skin is warm, dry and intact. No rash noted. Psychiatric: Mood and affect are normal. Speech and behavior are normal.  ____________________________________________   LABS (all labs ordered are listed, but only abnormal results are displayed)  Labs Reviewed  INFLUENZA PANEL BY PCR (TYPE A & B) - Abnormal; Notable for the following components:      Result Value   Influenza B By PCR POSITIVE (*)    All other components within normal limits   ____________________________________________   ____________________________________________  RADIOLOGY    ____________________________________________   PROCEDURES  Procedure(s) performed: No  Procedures    ____________________________________________   INITIAL IMPRESSION / ASSESSMENT AND PLAN  / ED COURSE  Pertinent labs & imaging results that were available during my care of the patient were reviewed by me and considered in my medical decision making (see chart for details).   Patient is 27 year old female presents emergency department complaining of flulike symptoms.  Physical exam shows a low-grade temp, elevated blood pressure, oxygen level is normal.  She is coughing with a dry harsh cough.  Remainder the exam is unremarkable  Flu swab is positive for b  Patient was moved to room 15 with her son who is more ill with retractions.  Report was given to Dr.  Lenard LancePaduchowski  As part of my medical decision making, I reviewed the following data within the electronic MEDICAL RECORD NUMBER Nursing notes reviewed and incorporated, Labs reviewed influenza test is positive for B, Old chart reviewed, Evaluated by EM attending Dr. Lenard LancePaduchowski, Notes from prior ED visits and [place] Controlled Substance Database  ____________________________________________   FINAL CLINICAL IMPRESSION(S) / ED DIAGNOSES  Final diagnoses:  Influenza      NEW MEDICATIONS  STARTED DURING THIS VISIT:  Discharge Medication List as of 05/25/2018 11:55 AM       Note:  This document was prepared using Dragon voice recognition software and may include unintentional dictation errors.    Faythe GheeFisher, Travia Onstad W, PA-C 05/25/18 1258    Minna AntisPaduchowski, Kevin, MD 05/25/18 Rickey [id number]

## 2018-05-25 NOTE — ED Triage Notes (Signed)
Presents with body aches  Fever and headache  Sx's started yesterday

## 2018-05-25 NOTE — ED Notes (Signed)
Pr moved to Room 15  [facility]

## 2018-05-25 NOTE — ED Notes (Signed)
ED Provider at bedside. 

## 2018-07-18 ENCOUNTER — Emergency Department
Admission: EM | Admit: 2018-07-18 | Discharge: 2018-07-18 | Disposition: A | Discharge status: Home / Self Care | Payer: Medicaid Other | Attending: Student in an Organized Health Care Education/Training Program | Admitting: Student in an Organized Health Care Education/Training Program

## 2018-07-18 ENCOUNTER — Emergency Department: Payer: Medicaid Other

## 2018-07-18 ENCOUNTER — Encounter: Payer: Self-pay | Admitting: Emergency Medicine

## 2018-07-18 ENCOUNTER — Other Ambulatory Visit: Payer: Self-pay

## 2018-07-18 DIAGNOSIS — Z3A13 13 weeks gestation of pregnancy: Secondary | ICD-10-CM | POA: Insufficient documentation

## 2018-07-18 DIAGNOSIS — O99331 Smoking (tobacco) complicating pregnancy, first trimester: Secondary | ICD-10-CM | POA: Diagnosis not present

## 2018-07-18 DIAGNOSIS — O99511 Diseases of the respiratory system complicating pregnancy, first trimester: Secondary | ICD-10-CM | POA: Diagnosis not present

## 2018-07-18 DIAGNOSIS — J101 Influenza due to other identified influenza virus with other respiratory manifestations: Secondary | ICD-10-CM

## 2018-07-18 DIAGNOSIS — Z79899 Other long term (current) drug therapy: Secondary | ICD-10-CM | POA: Diagnosis not present

## 2018-07-18 DIAGNOSIS — R102 Pelvic and perineal pain: Secondary | ICD-10-CM | POA: Diagnosis not present

## 2018-07-18 DIAGNOSIS — F17210 Nicotine dependence, cigarettes, uncomplicated: Secondary | ICD-10-CM | POA: Diagnosis not present

## 2018-07-18 DIAGNOSIS — J4520 Mild intermittent asthma, uncomplicated: Secondary | ICD-10-CM | POA: Diagnosis not present

## 2018-07-18 DIAGNOSIS — O9989 Other specified diseases and conditions complicating pregnancy, childbirth and the puerperium: Secondary | ICD-10-CM | POA: Diagnosis present

## 2018-07-18 DIAGNOSIS — O26899 Other specified pregnancy related conditions, unspecified trimester: Secondary | ICD-10-CM

## 2018-07-18 HISTORY — DX: Unspecified ovarian cyst, unspecified side: N83.209

## 2018-07-18 LAB — CBC WITH DIFFERENTIAL/PLATELET
ABS IMMATURE GRANULOCYTES: 0.07 10*3/uL (ref 0.00–0.07)
BASOS PCT: 0 %
Basophils Absolute: 0 10*3/uL (ref 0.0–0.1)
EOS ABS: 0.2 10*3/uL (ref 0.0–0.5)
Eosinophils Relative: 1 %
HEMATOCRIT: 37 % (ref 36.0–46.0)
Hemoglobin: 12.9 g/dL (ref 12.0–15.0)
Immature Granulocytes: 1 %
Lymphocytes Relative: 10 %
Lymphs Abs: 1.4 10*3/uL (ref 0.7–4.0)
MCH: 31.9 pg (ref 26.0–34.0)
MCHC: 34.9 g/dL (ref 30.0–36.0)
MCV: 91.4 fL (ref 80.0–100.0)
MONOS PCT: 12 %
Monocytes Absolute: 1.5 10*3/uL — ABNORMAL HIGH (ref 0.1–1.0)
Neutro Abs: 10.1 10*3/uL — ABNORMAL HIGH (ref 1.7–7.7)
Neutrophils Relative %: 76 %
Platelets: 245 10*3/uL (ref 150–400)
RBC: 4.05 MIL/uL (ref 3.87–5.11)
RDW: 12.9 % (ref 11.5–15.5)
Smear Review: NORMAL
WBC: 13.2 10*3/uL — ABNORMAL HIGH (ref 4.0–10.5)
nRBC: 0 % (ref 0.0–0.2)

## 2018-07-18 LAB — URINALYSIS, COMPLETE (UACMP) WITH MICROSCOPIC
Bilirubin Urine: NEGATIVE
Glucose, UA: NEGATIVE mg/dL
Hgb urine dipstick: NEGATIVE
Ketones, ur: 80 mg/dL — AB
Leukocytes,Ua: NEGATIVE
Nitrite: NEGATIVE
PH: 5 (ref 5.0–8.0)
Protein, ur: 100 mg/dL — AB
Specific Gravity, Urine: 1.029 (ref 1.005–1.030)

## 2018-07-18 LAB — COMPREHENSIVE METABOLIC PANEL
ALK PHOS: 63 U/L (ref 38–126)
ALT: 11 U/L (ref 0–44)
AST: 22 U/L (ref 15–41)
Albumin: 3.6 g/dL (ref 3.5–5.0)
Anion gap: 12 (ref 5–15)
BILIRUBIN TOTAL: 1.1 mg/dL (ref 0.3–1.2)
BUN: 6 mg/dL (ref 6–20)
CALCIUM: 8.8 mg/dL — AB (ref 8.9–10.3)
CO2: 18 mmol/L — ABNORMAL LOW (ref 22–32)
CREATININE: 0.65 mg/dL (ref 0.44–1.00)
Chloride: 103 mmol/L (ref 98–111)
GFR calc non Af Amer: >60 mL/min (ref >60)
GLUCOSE: 133 mg/dL — AB (ref 70–99)
Potassium: 3.2 mmol/L — ABNORMAL LOW (ref 3.5–5.1)
Sodium: 133 mmol/L — ABNORMAL LOW (ref 135–145)
TOTAL PROTEIN: 7.6 g/dL (ref 6.5–8.1)

## 2018-07-18 LAB — POCT PREGNANCY, URINE: PREG TEST UR: POSITIVE — AB

## 2018-07-18 LAB — INFLUENZA PANEL BY PCR (TYPE A & B)
INFLAPCR: POSITIVE — AB
INFLBPCR: NEGATIVE

## 2018-07-18 LAB — HCG, QUANTITATIVE, PREGNANCY: HCG, BETA CHAIN, QUANT, S: 135707 m[IU]/mL — AB (ref <5)

## 2018-07-18 LAB — LIPASE, BLOOD: LIPASE: 21 U/L (ref 11–51)

## 2018-07-18 MED ORDER — ALBUTEROL SULFATE (2.5 MG/3ML) 0.083% IN NEBU
2.5000 mg | INHALATION_SOLUTION | Freq: Once | RESPIRATORY_TRACT | Status: AC
  Administered 2018-07-18: 2.5 mg via RESPIRATORY_TRACT
  Filled 2018-07-18: qty 3

## 2018-07-18 MED ORDER — ACETAMINOPHEN 500 MG PO TABS
1000.0000 mg | ORAL_TABLET | Freq: Once | ORAL | Status: AC
  Administered 2018-07-18: 1000 mg via ORAL
  Filled 2018-07-18: qty 2

## 2018-07-18 MED ORDER — OSELTAMIVIR PHOSPHATE 75 MG PO CAPS
75.0000 mg | ORAL_CAPSULE | Freq: Once | ORAL | Status: AC
  Administered 2018-07-18: 75 mg via ORAL
  Filled 2018-07-18: qty 1

## 2018-07-18 MED ORDER — PREDNISONE 20 MG PO TABS
40.0000 mg | ORAL_TABLET | Freq: Once | ORAL | Status: AC
  Administered 2018-07-18: 40 mg via ORAL
  Filled 2018-07-18: qty 2

## 2018-07-18 MED ORDER — OSELTAMIVIR PHOSPHATE 75 MG PO CAPS: 75.0000 mg | ORAL_CAPSULE | Freq: Two times a day (BID) | ORAL | 0 refills | Status: AC

## 2018-07-18 MED ORDER — SODIUM CHLORIDE 0.9 % IV BOLUS
1000.0000 mL | Freq: Once | INTRAVENOUS | Status: AC
  Administered 2018-07-18: 1000 mL via INTRAVENOUS

## 2018-07-18 MED ORDER — PREDNISONE 20 MG PO TABS: 40.0000 mg | ORAL_TABLET | Freq: Every day | ORAL | 0 refills | Status: AC

## 2018-07-18 MED ORDER — ALBUTEROL SULFATE HFA 108 (90 BASE) MCG/ACT IN AERS: 2.0000 | INHALATION_SPRAY | Freq: Four times a day (QID) | RESPIRATORY_TRACT | 2 refills | Status: DC | PRN

## 2018-07-18 MED ORDER — ONDANSETRON HCL 4 MG/2ML IJ SOLN
4.0000 mg | Freq: Once | INTRAMUSCULAR | Status: AC
  Administered 2018-07-18: 4 mg via INTRAVENOUS
  Filled 2018-07-18: qty 2

## 2018-07-18 NOTE — ED Notes (Signed)
Pt up to bedside toilet.  

## 2018-07-18 NOTE — ED Triage Notes (Signed)
PT to ER via EMS from home with c/o shortness of breath, fever, abdominal pain, n/v for last 3 days.  Cough productive.

## 2018-07-18 NOTE — ED Provider Notes (Signed)
Casey Owens    First MD Initiated Contact with Patient 07/18/18 [phone]     (approximate)  I have reviewed the triage vital signs and the nursing notes.   HISTORY  Chief Complaint Abdominal Pain and Shortness of Breath    HPI Casey Owens is a 28 y.o. female below listed past medical history with last menstrual period being in November of last year presents the ER with several days of progressively worsening nausea vomiting shortness of breath cough fevers and chills.  Also complaining of right pelvic pain that she feels is related to her previous diagnosis of ovarian cyst.  Denies any dysuria.  No vaginal bleeding.  States the pain is constant and mild to moderate.    Past Medical History:  Diagnosis Date  . Asthma   . Depression   . Ovarian cyst   . 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 Hypertension Mother   . Arthritis Paternal Aunt   . Hypertension Maternal Grandmother   . Arthritis Paternal Grandmother    Past Surgical History:  Procedure Laterality Date  . NO PAST SURGERIES     Patient Active Problem List   Diagnosis Date Noted  . Preterm labor 10/10/2017  . Indication for care in labor or delivery 09/21/2017  . Preterm uterine contractions in second trimester, antepartum 08/08/2017  . Polyhydramnios affecting pregnancy 07/30/2017  . Cervical insufficiency during pregnancy, antepartum 07/30/2017  . Asymptomatic bacteriuria during pregnancy in second trimester 05/27/2017  . History of drug use 04/29/2017  . Previous preterm delivery, antepartum 04/29/2017  . Supervision of high-risk pregnancy 04/28/2017  . Tobacco use in pregnancy 02/10/2015      Prior to Admission medications   Medication Sig Start Date End Date Taking? Authorizing Provider  albuterol (PROVENTIL HFA;VENTOLIN HFA) 108 (90 Base)  MCG/ACT inhaler Inhale 2 puffs into the lungs every 6 (six) hours as needed for wheezing or shortness of breath. 07/18/18   Willy Eddy, MD  Budesonide (PULMICORT FLEXHALER) 90 MCG/ACT inhaler Inhale 1 puff into the lungs 2 (two) times daily. 04/01/18   Joni Reining, PA-C  FLUoxetine (PROZAC) 10 MG capsule Take 10 mg by mouth daily.    [provider]  oseltamivir (TAMIFLU) 75 MG capsule Take 1 capsule (75 mg total) by mouth 2 (two) times daily for 5 days. 07/18/18 07/23/18  Willy Eddy, MD  predniSONE (DELTASONE) 20 MG tablet Take 2 tablets (40 mg total) by mouth daily for 3 days. 07/18/18 07/21/18  Willy Eddy, MD    Allergies Shellfish allergy    Social History Social History   Tobacco Use  . Smoking status: Current Every Day Smoker    Packs/day: 0.25    Years: 10.00    Pack years: 2.50    Types: Cigarettes  . Smokeless tobacco: Never Used  Substance Use Topics  . Alcohol use: No  . Drug use: No    Comment: says quit drug use in January    Review of Systems Patient denies headaches, rhinorrhea, blurry vision, numbness, shortness of breath, chest pain, edema, cough, abdominal pain, nausea, vomiting, diarrhea, dysuria, fevers, rashes or hallucinations unless otherwise stated above in HPI. ____________________________________________   PHYSICAL EXAM:  VITAL SIGNS: Vitals:   07/18/18 1223 07/18/18 1225  BP:  131/85  Pulse: (!) 128   Resp:    Temp:    SpO2: 100%  100%    Constitutional: Alert and oriented.  Eyes: Conjunctivae are normal.  Head: Atraumatic. Nose: No congestion/rhinnorhea. Mouth/Throat: Mucous membranes are moist.   Neck: No stridor. Painless ROM.  Cardiovascular: Normal rate, regular rhythm. Grossly normal heart sounds.  Good peripheral circulation. Respiratory: Normal respiratory effort.  No retractions. Lungs with scattered wheeze. Gastrointestinal: Soft and nontender in all four quadrants, no peritonitis. No distention. No  abdominal bruits. No CVA tenderness. Genitourinary: deferred Musculoskeletal: No lower extremity tenderness nor edema.  No joint effusions. Neurologic:  Normal speech and language. No gross focal neurologic deficits are appreciated. No facial droop Skin:  Skin is warm, dry and intact. No rash noted. Psychiatric: Mood and affect are normal. Speech and behavior are normal.  ____________________________________________   LABS (all labs ordered are listed, but only abnormal results are displayed)  Results for orders placed or performed during the hospital encounter of 07/18/18 (from the past 24 hour(s))  CBC with Differential/Platelet     Status: Abnormal   Collection Time: 07/18/18  8:19 AM  Result Value Ref Range   WBC 13.2 (H) 4.0 - 10.5 K/uL   RBC 4.05 3.87 - 5.11 MIL/uL   Hemoglobin 12.9 12.0 - 15.0 g/dL   HCT 16.137.0 09.636.0 - 04.546.0 %   MCV 91.4 80.0 - 100.0 fL   MCH 31.9 26.0 - 34.0 pg   MCHC 34.9 30.0 - 36.0 g/dL   RDW 40.912.9 81.111.5 - 91.415.5 %   Platelets 245 150 - 400 K/uL   nRBC 0.0 0.0 - 0.2 %   Neutrophils Relative % 76 %   Neutro Abs 10.1 (H) 1.7 - 7.7 K/uL   Lymphocytes Relative 10 %   Lymphs Abs 1.4 0.7 - 4.0 K/uL   Monocytes Relative 12 %   Monocytes Absolute 1.5 (H) 0.1 - 1.0 K/uL   Eosinophils Relative 1 %   Eosinophils Absolute 0.2 0.0 - 0.5 K/uL   Basophils Relative 0 %   Basophils Absolute 0.0 0.0 - 0.1 K/uL   WBC Morphology MORPHOLOGY UNREMARKABLE    RBC Morphology MORPHOLOGY UNREMARKABLE    Smear Review Normal platelet morphology    Immature Granulocytes 1 %   Abs Immature Granulocytes 0.07 0.00 - 0.07 K/uL  Comprehensive metabolic panel     Status: Abnormal   Collection Time: 07/18/18  8:19 AM  Result Value Ref Range   Sodium 133 (L) 135 - 145 mmol/L   Potassium 3.2 (L) 3.5 - 5.1 mmol/L   Chloride 103 98 - 111 mmol/L   CO2 18 (L) 22 - 32 mmol/L   Glucose, Bld 133 (H) 70 - 99 mg/dL   BUN 6 6 - 20 mg/dL   Creatinine, Ser 7.820.65 0.44 - 1.00 mg/dL   Calcium 8.8 (L)  8.9 - 10.3 mg/dL   Total Protein 7.6 6.5 - 8.1 g/dL   Albumin 3.6 3.5 - 5.0 g/dL   AST 22 15 - 41 U/L   ALT 11 0 - 44 U/L   Alkaline Phosphatase 63 38 - 126 U/L   Total Bilirubin 1.1 0.3 - 1.2 mg/dL   GFR calc non Af Amer >60 >60 mL/min   GFR calc Af Amer >60 >60 mL/min   Anion gap 12 5 - 15  Lipase, blood     Status: None   Collection Time: 07/18/18  8:19 AM  Result Value Ref Range   Lipase 21 11 - 51 U/L  hCG, quantitative, pregnancy     Status: Abnormal   Collection Time: 07/18/18  8:19  AM  Result Value Ref Range   hCG, Beta Chain, Quant, S 135,707 (H) <5 mIU/mL  Urinalysis, Complete w Microscopic     Status: Abnormal   Collection Time: 07/18/18  8:27 AM  Result Value Ref Range   Color, Urine YELLOW (A) YELLOW   APPearance CLEAR (A) CLEAR   Specific Gravity, Urine 1.029 1.005 - 1.030   pH 5.0 5.0 - 8.0   Glucose, UA NEGATIVE NEGATIVE mg/dL   Hgb urine dipstick NEGATIVE NEGATIVE   Bilirubin Urine NEGATIVE NEGATIVE   Ketones, ur 80 (A) NEGATIVE mg/dL   Protein, ur 854 (A) NEGATIVE mg/dL   Nitrite NEGATIVE NEGATIVE   Leukocytes,Ua NEGATIVE NEGATIVE   RBC / HPF 0-5 0 - 5 RBC/hpf   WBC, UA 11-20 0 - 5 WBC/hpf   Bacteria, UA RARE (A) NONE SEEN   Squamous Epithelial / LPF 11-20 0 - 5   Mucus PRESENT   Influenza panel by PCR (type A & B)     Status: Abnormal   Collection Time: 07/18/18  8:27 AM  Result Value Ref Range   Influenza A By PCR POSITIVE (A) NEGATIVE   Influenza B By PCR NEGATIVE NEGATIVE  Pregnancy, urine POC     Status: Abnormal   Collection Time: 07/18/18  8:32 AM  Result Value Ref Range   Preg Test, Ur POSITIVE (A) NEGATIVE   ____________________________________________ ____________________________________________  RADIOLOGY  I personally reviewed all radiographic images ordered to evaluate for the above acute complaints and reviewed radiology reports and findings.  These findings were personally discussed with the patient.  Please see medical record for  radiology report.  ____________________________________________   PROCEDURES  Procedure(s) performed:  Procedures    Critical Care performed: no ____________________________________________   INITIAL IMPRESSION / ASSESSMENT AND PLAN / ED COURSE  Pertinent labs & imaging results that were available during my care of the patient were reviewed by me and considered in my medical decision making (see chart for details).   DDX: influenza, pna, uti, pylelo,, hyperemesis, ectopic, stone  Azayla S Kadow is a 28 y.o. who presents to the ED with symptoms as described above.  Is presenting with flulike illness symptoms.  Flu test is positive.  Pregnancy test is positive.  Does appear dehydrated therefore will give IV fluids.  Also with some wheezing on exam therefore will give nebs.  Will send for ultrasound given her pelvic pain in early pregnancy to rule out ectopic.  Denies any vaginal bleeding.  Clinical Course as of Jul 19 1231  Sat Jul 18, 2018  0849 RBC: 4.05 [PR]  1155 Patient reassessed.  Discussed results of testing thus far including flu positive and reassuring ultrasound.  Repeat abdominal exam is soft and benign.  This not consistent with acute appendicitis.  Is having some wheezing on gait exam will give a dose of Decadron as well as additional dose of albuterol.  She is otherwise well-appearing I do believe she stable and appropriate for outpatient follow-up.   [PR]    Clinical Course User Index [PR] Willy Eddy, MD     As part of my medical decision making, I reviewed the following data within the electronic MEDICAL RECORD NUMBER Nursing notes reviewed and incorporated, Labs reviewed, notes from prior ED visits and [place] Controlled Substance Database   ____________________________________________   FINAL CLINICAL IMPRESSION(S) / ED DIAGNOSES  Final diagnoses:  Pelvic pain affecting pregnancy  Influenza A  Mild intermittent asthma without complication      NEW  MEDICATIONS STARTED  DURING THIS VISIT:  Discharge Medication List as of 07/18/2018 12:00 PM    START taking these medications   Details  oseltamivir (TAMIFLU) 75 MG capsule Take 1 capsule (75 mg total) by mouth 2 (two) times daily for 5 days., Starting Sat 07/18/2018, Until Thu 07/23/2018, Normal    predniSONE (DELTASONE) 20 MG tablet Take 2 tablets (40 mg total) by mouth daily for 3 days., Starting Sat 07/18/2018, Until Tue 07/21/2018, Normal         Owens:  This document was prepared using Dragon voice recognition software and may include unintentional dictation errors.    Willy Eddy, MD 07/18/18 1233

## 2018-09-19 ENCOUNTER — Observation Stay
Admission: EM | Admit: 2018-09-19 | Discharge: 2018-09-19 | Disposition: A | Discharge status: Home / Self Care | Payer: Medicaid Other | Attending: Obstetrics and Gynecology | Admitting: Obstetrics and Gynecology

## 2018-09-19 ENCOUNTER — Other Ambulatory Visit: Payer: Self-pay

## 2018-09-19 ENCOUNTER — Encounter: Payer: Self-pay | Admitting: *Deleted

## 2018-09-19 DIAGNOSIS — Z3A18 18 weeks gestation of pregnancy: Secondary | ICD-10-CM | POA: Insufficient documentation

## 2018-09-19 DIAGNOSIS — K5900 Constipation, unspecified: Secondary | ICD-10-CM | POA: Insufficient documentation

## 2018-09-19 DIAGNOSIS — J45909 Unspecified asthma, uncomplicated: Secondary | ICD-10-CM | POA: Insufficient documentation

## 2018-09-19 DIAGNOSIS — Z7951 Long term (current) use of inhaled steroids: Secondary | ICD-10-CM | POA: Insufficient documentation

## 2018-09-19 DIAGNOSIS — Z79899 Other long term (current) drug therapy: Secondary | ICD-10-CM | POA: Insufficient documentation

## 2018-09-19 DIAGNOSIS — F329 Major depressive disorder, single episode, unspecified: Secondary | ICD-10-CM | POA: Insufficient documentation

## 2018-09-19 DIAGNOSIS — R102 Pelvic and perineal pain: Secondary | ICD-10-CM | POA: Insufficient documentation

## 2018-09-19 DIAGNOSIS — O99342 Other mental disorders complicating pregnancy, second trimester: Secondary | ICD-10-CM | POA: Insufficient documentation

## 2018-09-19 DIAGNOSIS — O99612 Diseases of the digestive system complicating pregnancy, second trimester: Secondary | ICD-10-CM | POA: Diagnosis not present

## 2018-09-19 DIAGNOSIS — R109 Unspecified abdominal pain: Secondary | ICD-10-CM

## 2018-09-19 DIAGNOSIS — O26892 Other specified pregnancy related conditions, second trimester: Secondary | ICD-10-CM | POA: Diagnosis not present

## 2018-09-19 DIAGNOSIS — Z8249 Family history of ischemic heart disease and other diseases of the circulatory system: Secondary | ICD-10-CM | POA: Insufficient documentation

## 2018-09-19 DIAGNOSIS — O99512 Diseases of the respiratory system complicating pregnancy, second trimester: Secondary | ICD-10-CM | POA: Diagnosis not present

## 2018-09-19 DIAGNOSIS — O99332 Smoking (tobacco) complicating pregnancy, second trimester: Secondary | ICD-10-CM | POA: Diagnosis not present

## 2018-09-19 LAB — URINE DRUG SCREEN, QUALITATIVE (ARMC ONLY)
Amphetamines, Ur Screen: NOT DETECTED
Barbiturates, Ur Screen: NOT DETECTED
Benzodiazepine, Ur Scrn: NOT DETECTED
Cannabinoid 50 Ng, Ur ~~LOC~~: POSITIVE — AB
Cocaine Metabolite,Ur ~~LOC~~: NOT DETECTED
MDMA (Ecstasy)Ur Screen: NOT DETECTED
Methadone Scn, Ur: NOT DETECTED
Opiate, Ur Screen: NOT DETECTED
Phencyclidine (PCP) Ur S: NOT DETECTED
Tricyclic, Ur Screen: NOT DETECTED

## 2018-09-19 LAB — CBC
HCT: 33.3 % — ABNORMAL LOW (ref 36.0–46.0)
Hemoglobin: 11.6 g/dL — ABNORMAL LOW (ref 12.0–15.0)
MCH: 32.9 pg (ref 26.0–34.0)
MCHC: 34.8 g/dL (ref 30.0–36.0)
MCV: 94.3 fL (ref 80.0–100.0)
Platelets: 270 10*3/uL (ref 150–400)
RBC: 3.53 MIL/uL — ABNORMAL LOW (ref 3.87–5.11)
RDW: 13.4 % (ref 11.5–15.5)
WBC: 15.7 10*3/uL — ABNORMAL HIGH (ref 4.0–10.5)
nRBC: 0 % (ref 0.0–0.2)

## 2018-09-19 LAB — DIFFERENTIAL
Abs Immature Granulocytes: 0.11 10*3/uL — ABNORMAL HIGH (ref 0.00–0.07)
Basophils Absolute: 0 10*3/uL (ref 0.0–0.1)
Basophils Relative: 0 %
Eosinophils Absolute: 0.1 10*3/uL (ref 0.0–0.5)
Eosinophils Relative: 0 %
Immature Granulocytes: 1 %
Lymphocytes Relative: 14 %
Lymphs Abs: 2.2 10*3/uL (ref 0.7–4.0)
Monocytes Absolute: 1 10*3/uL (ref 0.1–1.0)
Monocytes Relative: 6 %
Neutro Abs: 12.4 10*3/uL — ABNORMAL HIGH (ref 1.7–7.7)
Neutrophils Relative %: 79 %

## 2018-09-19 LAB — WET PREP, GENITAL
Sperm: NONE SEEN
Trich, Wet Prep: NONE SEEN
Yeast Wet Prep HPF POC: NONE SEEN

## 2018-09-19 LAB — URINALYSIS, ROUTINE W REFLEX MICROSCOPIC
Bilirubin Urine: NEGATIVE
Glucose, UA: NEGATIVE mg/dL
Hgb urine dipstick: NEGATIVE
Ketones, ur: 20 mg/dL — AB
Leukocytes,Ua: NEGATIVE
Nitrite: NEGATIVE
Protein, ur: NEGATIVE mg/dL
Specific Gravity, Urine: 1.019 (ref 1.005–1.030)
pH: 7 (ref 5.0–8.0)

## 2018-09-19 LAB — CHLAMYDIA/NGC RT PCR (ARMC ONLY)
Chlamydia Tr: NOT DETECTED
N gonorrhoeae: NOT DETECTED

## 2018-09-19 LAB — TYPE AND SCREEN
ABO/RH(D): O POS
Antibody Screen: NEGATIVE

## 2018-09-19 LAB — RAPID HIV SCREEN (HIV 1/2 AB+AG)
HIV 1/2 Antibodies: NONREACTIVE
HIV-1 P24 Antigen - HIV24: NONREACTIVE

## 2018-09-19 MED ORDER — ONDANSETRON 4 MG PO TBDP: 4.0000 mg | ORAL_TABLET | Freq: Four times a day (QID) | ORAL | 0 refills | Status: DC | PRN

## 2018-09-19 MED ORDER — FAMOTIDINE 20 MG PO TABS
ORAL_TABLET | ORAL | Status: AC
  Administered 2018-09-19: 12:00:00 20 mg
  Filled 2018-09-19: qty 1

## 2018-09-19 MED ORDER — FAMOTIDINE 20 MG PO TABS: 20.0000 mg | ORAL_TABLET | Freq: Two times a day (BID) | ORAL | 0 refills | Status: DC

## 2018-09-19 MED ORDER — FAMOTIDINE 20 MG PO TABS: 20.0000 mg | ORAL_TABLET | Freq: Once | ORAL | Status: DC

## 2018-09-19 MED ORDER — METRONIDAZOLE 500 MG PO TABS: 500.0000 mg | ORAL_TABLET | Freq: Two times a day (BID) | ORAL | 0 refills | Status: DC

## 2018-09-19 MED ORDER — ONDANSETRON 4 MG PO TBDP
8.0000 mg | ORAL_TABLET | Freq: Once | ORAL | Status: AC
  Administered 2018-09-19: 12:00:00 8 mg via ORAL

## 2018-09-19 MED ORDER — METRONIDAZOLE 500 MG PO TABS
500.0000 mg | ORAL_TABLET | Freq: Two times a day (BID) | ORAL | Status: DC
  Filled 2018-09-19: qty 1

## 2018-09-19 MED ORDER — ONDANSETRON 4 MG PO TBDP
ORAL_TABLET | ORAL | Status: AC
  Administered 2018-09-19: 8 mg via ORAL
  Filled 2018-09-19: qty 2

## 2018-09-19 MED ORDER — ACETAMINOPHEN 500 MG PO TABS
1000.0000 mg | ORAL_TABLET | Freq: Once | ORAL | Status: AC
  Administered 2018-09-19: 1000 mg via ORAL
  Filled 2018-09-19: qty 2

## 2018-09-19 NOTE — OB Triage Note (Signed)
Pt c/o back and abdominal pain, intermittent. Pain starts in lower/mid abdomen and radiated to her back. R back hurting worse than left. Denies vaginal bleeding. Denies LOF. Reports feeling baby moving. Casey Owens

## 2018-09-19 NOTE — Discharge Summary (Signed)
Casey Owens is a 28 y.o. female. She is at [redacted]w[redacted]d gestation. Patient's last menstrual period was 04/25/2018.   Estimated Date of Delivery: 02/14/19 - dating based on early US done at [facility], 2/22 at [redacted]w[redacted]d  Prenatal care site: No care, she was afraid to get out due to COVID19  Current pregnancy complicated by:  1. No prenatal care this pregnancy- last preg care at [facility].  2. Hx preterm birth for last 4 deliveries, 33-35wks 3. Closely spaced pregnancy- last delivery 09/2017 4. Grand multiparity 5. Tobacco and hx drug use including MJ, cocaine and ETOH 6. asthma  Chief complaint:c/o back and abdominal pain, intermittent  Location: Pain starts in lower/mid abdomen and groin; radiates to her back, right side hurts worse than left.  Onset/timing: pain started 10 days ago Duration:  constant Quality: aching pain Severity: Aggravating or alleviating conditions: none Associated signs/symptoms: constipation- had a hard BM yesterday with significant straining.   Context: denies recent IC.   S: Resting comfortably. no CTX, no VB.no LOF,  Active fetal movement. Denies: HA, visual changes, SOB, or RUQ/epigastric pain  Maternal Medical History:   Past Medical History:  Diagnosis Date  . Asthma   . Depression   . Ovarian cyst   . 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 NO PAST SURGERIES      Allergies  Allergen Reactions  . Shellfish Allergy Swelling    Prior to Admission medications   Medication Sig Start Date End Date Taking? Authorizing Provider  albuterol (PROVENTIL HFA;VENTOLIN HFA) 108 (90 Base) MCG/ACT inhaler Inhale 2 puffs into the lungs every 6 (six) hours as needed for wheezing or shortness of breath. 07/18/18  Yes Willy Eddy, MD  prenatal vitamin w/FE, FA (PRENATAL 1 + 1) 27-1 MG TABS tablet Take 1 tablet by mouth daily at 12 noon.   Yes [provider]  Budesonide (PULMICORT FLEXHALER) 90 MCG/ACT inhaler Inhale 1 puff into the lungs 2 (two) times daily. 04/01/18   Joni Reining, PA-C  FLUoxetine (PROZAC) 10 MG capsule Take 10 mg by mouth daily.    [provider]      Social History: She  reports that she has been smoking cigarettes. She has a 2.50 pack-year smoking history. She has never used smokeless tobacco. She reports current drug use. Drug: Marijuana. She reports that she does not drink alcohol.  Family History: family history includes Arthritis in her paternal aunt and paternal grandmother; Hypertension in her maternal grandmother and mother.   Review of Systems: A full review of systems was performed and negative except as noted in the HPI.     O:  LMP 04/25/2018  No results found for this or any previous visit (from the past 48 hour(s)).   Constitutional: NAD, AAOx3  HE/ENT: extraocular movements grossly intact, moist mucous membranes CV: RRR PULM: nl respiratory effort, CTABL     Abd: gravid, non-tender, non-distended, soft; + CVAT on right.     Ext: Non-tender, Nonedematous   Psych: mood appropriate, speech normal Pelvic: SSE done- scant white adherent DC, no erythema. Cervix multiparous long and closed. Wet prep, GC/CT obtained.     FHR: via doppler 160bpm TOCO:  Mild uterine irritability    A/P: 28 y.o. [redacted]w[redacted]d here for antenatal surveillance for pelvic and abdominal pain.   Principle Diagnosis: abdominal pain, 2nd trimester   Preterm labor: not present.   Fetal  Wellbeing: reassuring FHR via doppler  Initial Prenatal labs and drug screen: + cannabinoids.  Reviewed comfort measures for constipation, encouraged twice daily stool softener or Miralax, adequate hydration.    UA: + ketones, wet prep: + clue cells; Rx Flagyl,  GC/CT: negative  Rx zofran and flagyl to pharmacy.   D/c home stable, precautions reviewed, follow-up- encouraged to call [facility] or [facility] for initial prenatal visit.     Randa NgoRebecca A Gicela Schwarting, CNM 09/19/2018  9:41 AM

## 2018-09-21 LAB — HEPATITIS B SURFACE ANTIGEN: Hepatitis B Surface Ag: NEGATIVE

## 2018-09-22 LAB — RPR: RPR Ser Ql: NONREACTIVE

## 2018-09-22 LAB — RUBELLA SCREEN: Rubella: 1.6 index (ref >0.99)

## 2018-09-22 LAB — VARICELLA ZOSTER ANTIBODY, IGG: Varicella IgG: 591 index (ref >165)

## 2018-10-13 ENCOUNTER — Telehealth: Payer: Self-pay | Admitting: Obstetrics & Gynecology

## 2018-10-13 ENCOUNTER — Other Ambulatory Visit: Payer: Self-pay | Admitting: Obstetrics & Gynecology

## 2018-10-13 ENCOUNTER — Other Ambulatory Visit: Payer: Self-pay

## 2018-10-13 ENCOUNTER — Other Ambulatory Visit (INDEPENDENT_AMBULATORY_CARE_PROVIDER_SITE_OTHER): Payer: Medicaid Other

## 2018-10-13 ENCOUNTER — Other Ambulatory Visit: Payer: Self-pay | Admitting: Family Medicine

## 2018-10-13 DIAGNOSIS — Z363 Encounter for antenatal screening for malformations: Secondary | ICD-10-CM

## 2018-10-13 DIAGNOSIS — O26872 Cervical shortening, second trimester: Secondary | ICD-10-CM

## 2018-10-13 DIAGNOSIS — Z3689 Encounter for other specified antenatal screening: Secondary | ICD-10-CM

## 2018-10-13 NOTE — Telephone Encounter (Signed)
I called and left voicemail for patient to call back to confirm schedule appointment. Patient is schedule 11/03/18 at 9:30. Arrival is at 9:15 needs to drink 32 oz 1 hour prior to appointment to have full bladder. Outpatient imaging at [address], [place], [place] [address] °

## 2018-10-13 NOTE — Telephone Encounter (Signed)
Dr. Tiburcio Pea, We have patient schedule for NOB transfer 10/26/18. At this time we do not I have opening in ultrasound to schedule patient back in two weeks. I will need orders place for me to schedule with outpatient imaging. Please advise

## 2018-10-13 NOTE — Progress Notes (Signed)
Care d/w Dr Alvester Morin of [facility] Will accept transfer of care for high risk factor of PTL risk    Prior PTD x3    Cervical length<2cm at 22 week [place]  Plan [place] again 2 weeks Consider BMZ afte 24 weeks Too late for 17OHP Start vaginal progesterone nightly  Annamarie Major, MD, [facility], [facility] 10/13/2018  12:11 PM

## 2018-10-13 NOTE — Telephone Encounter (Signed)
-----   Message from Nadara Mustard, MD sent at 10/13/2018 12:11 PM EDT ----- Regarding: Sch appt w MD and US OB cervical length 2 weeks (NOB transfer)

## 2018-10-13 NOTE — Telephone Encounter (Signed)
I called and left voicemail for patient to call back to confirm schedule appointment. Patient is schedule 11/03/18 at 9:30. Arrival is at 9:15 needs to drink 32 oz 1 hour prior to appointment to have full bladder. Outpatient imaging at [address], [place], [place] [address]

## 2018-11-02 ENCOUNTER — Other Ambulatory Visit: Payer: Self-pay | Admitting: Obstetrics & Gynecology

## 2018-11-02 DIAGNOSIS — O26872 Cervical shortening, second trimester: Secondary | ICD-10-CM

## 2018-11-03 ENCOUNTER — Other Ambulatory Visit: Payer: Self-pay

## 2018-11-03 ENCOUNTER — Ambulatory Visit
Admission: RE | Admit: 2018-11-03 | Discharge: 2018-11-03 | Disposition: A | Discharge status: Home / Self Care | Payer: Medicaid Other | Source: Ambulatory Visit | Attending: Obstetrics & Gynecology | Admitting: Obstetrics & Gynecology

## 2018-11-03 DIAGNOSIS — O26872 Cervical shortening, second trimester: Secondary | ICD-10-CM | POA: Diagnosis present

## 2018-11-05 ENCOUNTER — Ambulatory Visit (INDEPENDENT_AMBULATORY_CARE_PROVIDER_SITE_OTHER): Payer: Medicaid Other | Admitting: Obstetrics and Gynecology

## 2018-11-05 ENCOUNTER — Encounter: Payer: Self-pay | Admitting: Obstetrics and Gynecology

## 2018-11-05 ENCOUNTER — Other Ambulatory Visit: Payer: Self-pay

## 2018-11-05 ENCOUNTER — Observation Stay
Admission: EM | Admit: 2018-11-05 | Discharge: 2018-11-05 | Disposition: A | Discharge status: Other Acute Inpatient Hospital | Payer: Medicaid Other | Attending: Obstetrics & Gynecology | Admitting: Obstetrics & Gynecology

## 2018-11-05 VITALS — BP 120/70 | Wt 127.0 lb

## 2018-11-05 DIAGNOSIS — M545 Low back pain: Secondary | ICD-10-CM | POA: Insufficient documentation

## 2018-11-05 DIAGNOSIS — O09212 Supervision of pregnancy with history of pre-term labor, second trimester: Principal | ICD-10-CM | POA: Insufficient documentation

## 2018-11-05 DIAGNOSIS — Z3A25 25 weeks gestation of pregnancy: Secondary | ICD-10-CM | POA: Diagnosis not present

## 2018-11-05 DIAGNOSIS — Z8249 Family history of ischemic heart disease and other diseases of the circulatory system: Secondary | ICD-10-CM | POA: Insufficient documentation

## 2018-11-05 DIAGNOSIS — Z79899 Other long term (current) drug therapy: Secondary | ICD-10-CM | POA: Insufficient documentation

## 2018-11-05 DIAGNOSIS — F4310 Post-traumatic stress disorder, unspecified: Secondary | ICD-10-CM | POA: Insufficient documentation

## 2018-11-05 DIAGNOSIS — O99342 Other mental disorders complicating pregnancy, second trimester: Secondary | ICD-10-CM | POA: Insufficient documentation

## 2018-11-05 DIAGNOSIS — O99332 Smoking (tobacco) complicating pregnancy, second trimester: Secondary | ICD-10-CM | POA: Diagnosis not present

## 2018-11-05 DIAGNOSIS — O4702 False labor before 37 completed weeks of gestation, second trimester: Secondary | ICD-10-CM

## 2018-11-05 DIAGNOSIS — Z7951 Long term (current) use of inhaled steroids: Secondary | ICD-10-CM | POA: Diagnosis not present

## 2018-11-05 DIAGNOSIS — O99512 Diseases of the respiratory system complicating pregnancy, second trimester: Secondary | ICD-10-CM | POA: Diagnosis not present

## 2018-11-05 DIAGNOSIS — O26892 Other specified pregnancy related conditions, second trimester: Secondary | ICD-10-CM | POA: Insufficient documentation

## 2018-11-05 DIAGNOSIS — O0992 Supervision of high risk pregnancy, unspecified, second trimester: Secondary | ICD-10-CM

## 2018-11-05 DIAGNOSIS — J45909 Unspecified asthma, uncomplicated: Secondary | ICD-10-CM | POA: Insufficient documentation

## 2018-11-05 DIAGNOSIS — F319 Bipolar disorder, unspecified: Secondary | ICD-10-CM | POA: Diagnosis not present

## 2018-11-05 DIAGNOSIS — O3430 Maternal care for cervical incompetence, unspecified trimester: Secondary | ICD-10-CM

## 2018-11-05 DIAGNOSIS — O3432 Maternal care for cervical incompetence, second trimester: Secondary | ICD-10-CM

## 2018-11-05 HISTORY — DX: Post-traumatic stress disorder, unspecified: F43.10

## 2018-11-05 HISTORY — DX: Anxiety disorder, unspecified: F41.9

## 2018-11-05 LAB — URINE DRUG SCREEN, QUALITATIVE (ARMC ONLY)
Amphetamines, Ur Screen: NOT DETECTED
Barbiturates, Ur Screen: NOT DETECTED
Benzodiazepine, Ur Scrn: NOT DETECTED
Cannabinoid 50 Ng, Ur ~~LOC~~: POSITIVE — AB
Cocaine Metabolite,Ur ~~LOC~~: POSITIVE — AB
MDMA (Ecstasy)Ur Screen: NOT DETECTED
Methadone Scn, Ur: NOT DETECTED
Opiate, Ur Screen: NOT DETECTED
Phencyclidine (PCP) Ur S: NOT DETECTED
Tricyclic, Ur Screen: NOT DETECTED

## 2018-11-05 LAB — FETAL FIBRONECTIN: Fetal Fibronectin: POSITIVE — AB

## 2018-11-05 MED ORDER — ACETAMINOPHEN 500 MG PO TABS: 1000.0000 mg | ORAL_TABLET | Freq: Four times a day (QID) | ORAL | Status: DC | PRN

## 2018-11-05 MED ORDER — BETAMETHASONE SOD PHOS & ACET 6 (3-3) MG/ML IJ SUSP
12.0000 mg | INTRAMUSCULAR | Status: DC
  Administered 2018-11-05: 12 mg via INTRAMUSCULAR

## 2018-11-05 MED ORDER — BETAMETHASONE SOD PHOS & ACET 6 (3-3) MG/ML IJ SUSP
INTRAMUSCULAR | Status: AC
  Filled 2018-11-05: qty 5

## 2018-11-05 NOTE — Progress Notes (Signed)
After telephone consultation, the recommendation from Dr. Zack Seal with Maternal Fetal Medicine is to transfer Casey Owens to a tertiary care center, given findings suggesting threatened preterm labor at 25 weeks, 4 days. Discussed the plan of care with patient and requested consent to give first dose of betamethasone. Patient agreed and chose [facility] for transfer of care. Betamethasone injection given by RN at 12:11 pm.  Spoke with Dr. Threasa Beards, who accepted patient transfer to Labor and Delivery triage at [facility] for further evaluation.   Casey Owens, CNM 11/05/2018  12:33 PM
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
## 2018-11-05 NOTE — Discharge Summary (Signed)
Physician Discharge Summary  Patient ID: Casey Owens MRN: [id number] DOB/AGE: 1991-05-16 28 y.o.  Admit date: 11/05/2018 Discharge date: 11/05/2018  Admission Diagnoses: Preterm cervical dilation at 25 weeks; history of preterm deliveries  Discharge Diagnoses: Same  Discharged Condition: stable  Hospital Course: Pt seen and examined, noted to be 1-2 cm dilated, ballottable, intact.  Arrnged transport to tertiary care facility with MFM and NICU availability.  Consults: None  Significant Diagnostic Studies: none.  Last ultrasound 11/03/2018.  Treatments: steroids: BMZ for fetal lung maturity  Discharge Exam: Blood pressure (!) 121/52, pulse (!) 55, temperature 98.3 F (36.8 C), temperature source Oral, resp. rate 16, last menstrual period 04/25/2018, SpO2 100 %, not currently breastfeeding. General appearance: alert, cooperative and no distress Resp: clear to auscultation bilaterally Cardio: regular rate and rhythm, S1, S2 normal, no murmur, click, rub or gallop GI: gravid, NT, no ctxs, ND, VTX, FHT 140s Pelvic: external genitalia normal and cervix 1-2/50/ ballottable, Vtx Extremities: extremities normal, atraumatic, no cyanosis or edema Skin: Skin color, texture, turgor normal. No rashes or lesions  Disposition: Discharge disposition: [place] Not Defined        Allergies as of 11/05/2018      Reactions   Shellfish Allergy Swelling      Medication List    TAKE these medications   albuterol 108 (90 Base) MCG/ACT inhaler Commonly known as: VENTOLIN HFA Inhale 2 puffs into the lungs every 6 (six) hours as needed for wheezing or shortness of breath.   Budesonide 90 MCG/ACT inhaler Commonly known as: Pulmicort Flexhaler Inhale 1 puff into the lungs 2 (two) times daily.   famotidine 20 MG tablet Commonly known as: PEPCID Take 1 tablet (20 mg total) by mouth 2 (two) times daily.   metroNIDAZOLE 500 MG tablet Commonly known as:  FLAGYL Take 1 tablet (500 mg total) by mouth 2 (two) times daily.   ondansetron 4 MG disintegrating tablet Commonly known as: Zofran ODT Take 1 tablet (4 mg total) by mouth every 6 (six) hours as needed for nausea.   prenatal vitamin w/FE, FA 27-1 MG Tabs tablet Take 1 tablet by mouth daily at 12 noon.        Signed: Hoyt Koch 11/05/2018, 2:37 PM

## 2018-11-05 NOTE — Progress Notes (Signed)
NOB transfer C/o achey in pelvic area and LQ, having trouble eating, and having a lot of home stressors

## 2018-11-10 MED ORDER — ONDANSETRON 4 MG PO TBDP
4.00 | ORAL_TABLET | ORAL | Status: DC
Start: ? — End: 2018-11-10

## 2018-11-10 MED ORDER — SIMETHICONE 80 MG PO CHEW
80.00 | CHEWABLE_TABLET | ORAL | Status: DC
Start: ? — End: 2018-11-10

## 2018-11-10 MED ORDER — PROMETHAZINE HCL 50 MG RE SUPP [COMPILED RECORD] [AGE PEDIATRIC]
0.40 | RECTAL | Status: DC
Start: ? — End: 2018-11-10

## 2018-11-10 MED ORDER — QUINERVA 260 MG PO TABS
650.00 | ORAL_TABLET | ORAL | Status: DC
Start: 2018-11-09 — End: 2018-11-10

## 2018-11-10 MED ORDER — GENERIC EXTERNAL MEDICATION
Status: DC
Start: ? — End: 2018-11-10

## 2018-11-10 MED ORDER — DIPHENHYDRAMINE HCL 25 MG PO CAPS
25.00 | ORAL_CAPSULE | ORAL | Status: DC
Start: ? — End: 2018-11-10

## 2018-11-10 MED ORDER — RUBIDIUM CHLORIDE POWD
1.00 | Status: DC
Start: 2018-11-10 — End: 2018-11-10

## 2018-11-10 MED ORDER — Medication
5.00 | Status: DC
Start: ? — End: 2018-11-10

## 2018-11-10 MED ORDER — FUTURO SOFT CERVICAL COLLAR MISC
600.00 | Status: DC
Start: 2018-11-09 — End: 2018-11-10

## 2018-11-10 MED ORDER — LIDOCAINE 5 % EX PTCH
1.00 | MEDICATED_PATCH | CUTANEOUS | Status: DC
Start: 2018-11-10 — End: 2018-11-10

## 2018-11-10 MED ORDER — GLUCOSAMINE-CHONDROIT-COLLAGEN PO
100.00 | ORAL | Status: DC
Start: 2018-11-09 — End: 2018-11-10

## 2019-03-02 ENCOUNTER — Encounter: Payer: Self-pay | Admitting: Advanced Practice Midwife

## 2019-03-02 DIAGNOSIS — F4310 Post-traumatic stress disorder, unspecified: Secondary | ICD-10-CM | POA: Insufficient documentation

## 2019-03-02 DIAGNOSIS — Z641 Problems related to multiparity: Secondary | ICD-10-CM | POA: Insufficient documentation

## 2019-03-02 DIAGNOSIS — F319 Bipolar disorder, unspecified: Secondary | ICD-10-CM | POA: Insufficient documentation

## 2019-03-02 DIAGNOSIS — J45909 Unspecified asthma, uncomplicated: Secondary | ICD-10-CM | POA: Insufficient documentation

## 2019-03-08 ENCOUNTER — Telehealth: Payer: Self-pay

## 2019-03-16 NOTE — Telephone Encounter (Signed)
Certified letter mailed to patient requesting contact d/t abn pap results. See scanned letter. Aileen Fass, RN

## 2019-04-14 NOTE — Telephone Encounter (Signed)
Certified letter receipt received. Closed to pap f/u Aileen Fass, RN

## 2020-02-08 ENCOUNTER — Ambulatory Visit: Payer: Self-pay

## 2020-10-03 ENCOUNTER — Ambulatory Visit: Payer: Medicaid Other

## 2020-10-31 ENCOUNTER — Ambulatory Visit: Payer: Medicaid Other

## 2020-12-18 ENCOUNTER — Ambulatory Visit: Payer: Medicaid Other

## 2020-12-26 ENCOUNTER — Ambulatory Visit: Payer: Medicaid Other

## 2021-01-13 ENCOUNTER — Other Ambulatory Visit: Payer: Self-pay

## 2021-01-13 ENCOUNTER — Encounter: Payer: Self-pay | Admitting: Emergency Medicine

## 2021-01-13 DIAGNOSIS — A599 Trichomoniasis, unspecified: Secondary | ICD-10-CM | POA: Diagnosis not present

## 2021-01-13 DIAGNOSIS — F17210 Nicotine dependence, cigarettes, uncomplicated: Secondary | ICD-10-CM | POA: Diagnosis not present

## 2021-01-13 DIAGNOSIS — N760 Acute vaginitis: Secondary | ICD-10-CM | POA: Insufficient documentation

## 2021-01-13 DIAGNOSIS — U071 COVID-19: Secondary | ICD-10-CM | POA: Insufficient documentation

## 2021-01-13 DIAGNOSIS — R112 Nausea with vomiting, unspecified: Secondary | ICD-10-CM | POA: Insufficient documentation

## 2021-01-13 DIAGNOSIS — J45909 Unspecified asthma, uncomplicated: Secondary | ICD-10-CM | POA: Insufficient documentation

## 2021-01-13 DIAGNOSIS — Z7951 Long term (current) use of inhaled steroids: Secondary | ICD-10-CM | POA: Insufficient documentation

## 2021-01-13 DIAGNOSIS — R1084 Generalized abdominal pain: Secondary | ICD-10-CM | POA: Diagnosis present

## 2021-01-13 MED ORDER — ONDANSETRON 4 MG PO TBDP
4.0000 mg | ORAL_TABLET | Freq: Once | ORAL | Status: AC | PRN
  Administered 2021-01-13: 4 mg via ORAL
  Filled 2021-01-13: qty 1

## 2021-01-13 NOTE — ED Triage Notes (Signed)
Pt arrived via [facility] from home with reports of low abd pain and low back pain since yesterday but worse this morning since 8am.   Pt had episode of vomiting prior to EMS arrival.   Denies any diarrhea. Pt denies any COVID exposure.

## 2021-01-13 NOTE — ED Triage Notes (Signed)
First RN Note: pt to ED via [facility] with c/o generalized lower abd pain that radiates to his back. Per EMS pt also c/o N/V with several episodes of emesis since 0800 this morning, last episode approx 1 hr ago.   124/72 90HR 100% RA

## 2021-01-14 ENCOUNTER — Emergency Department
Admission: EM | Admit: 2021-01-14 | Discharge: 2021-01-14 | Disposition: A | Discharge status: Home / Self Care | Payer: Medicaid Other | Attending: Emergency Medicine | Admitting: Emergency Medicine

## 2021-01-14 ENCOUNTER — Emergency Department: Payer: Medicaid Other

## 2021-01-14 DIAGNOSIS — R1115 Cyclical vomiting syndrome unrelated to migraine: Secondary | ICD-10-CM

## 2021-01-14 DIAGNOSIS — U071 COVID-19: Secondary | ICD-10-CM

## 2021-01-14 DIAGNOSIS — A599 Trichomoniasis, unspecified: Secondary | ICD-10-CM

## 2021-01-14 DIAGNOSIS — B9689 Other specified bacterial agents as the cause of diseases classified elsewhere: Secondary | ICD-10-CM

## 2021-01-14 LAB — CBC
HCT: 38.4 % (ref 36.0–46.0)
Hemoglobin: 13.7 g/dL (ref 12.0–15.0)
MCH: 33.8 pg (ref 26.0–34.0)
MCHC: 35.7 g/dL (ref 30.0–36.0)
MCV: 94.8 fL (ref 80.0–100.0)
Platelets: 284 10*3/uL (ref 150–400)
RBC: 4.05 MIL/uL (ref 3.87–5.11)
RDW: 13 % (ref 11.5–15.5)
WBC: 10.5 10*3/uL (ref 4.0–10.5)
nRBC: 0 % (ref 0.0–0.2)

## 2021-01-14 LAB — URINALYSIS, COMPLETE (UACMP) WITH MICROSCOPIC
Bilirubin Urine: NEGATIVE
Glucose, UA: NEGATIVE mg/dL
Ketones, ur: >160 mg/dL — AB
Nitrite: NEGATIVE
Protein, ur: 30 mg/dL — AB
RBC / HPF: >50 RBC/hpf — ABNORMAL HIGH (ref 0–5)
Specific Gravity, Urine: >1.03 — ABNORMAL HIGH (ref 1.005–1.030)
WBC, UA: NONE SEEN WBC/hpf (ref 0–5)
pH: 5.5 (ref 5.0–8.0)

## 2021-01-14 LAB — COMPREHENSIVE METABOLIC PANEL
ALT: 17 U/L (ref 0–44)
AST: 19 U/L (ref 15–41)
Albumin: 4.2 g/dL (ref 3.5–5.0)
Alkaline Phosphatase: 77 U/L (ref 38–126)
Anion gap: 7 (ref 5–15)
BUN: 9 mg/dL (ref 6–20)
CO2: 20 mmol/L — ABNORMAL LOW (ref 22–32)
Calcium: 9.6 mg/dL (ref 8.9–10.3)
Chloride: 108 mmol/L (ref 98–111)
Creatinine, Ser: 0.96 mg/dL (ref 0.44–1.00)
GFR, Estimated: >60 mL/min (ref >60)
Glucose, Bld: 104 mg/dL — ABNORMAL HIGH (ref 70–99)
Potassium: 4.1 mmol/L (ref 3.5–5.1)
Sodium: 135 mmol/L (ref 135–145)
Total Bilirubin: 1 mg/dL (ref 0.3–1.2)
Total Protein: 7.8 g/dL (ref 6.5–8.1)

## 2021-01-14 LAB — WET PREP, GENITAL
Sperm: NONE SEEN
Yeast Wet Prep HPF POC: NONE SEEN

## 2021-01-14 LAB — CHLAMYDIA/NGC RT PCR (ARMC ONLY)
Chlamydia Tr: NOT DETECTED
N gonorrhoeae: NOT DETECTED

## 2021-01-14 LAB — PREGNANCY, URINE: Preg Test, Ur: NEGATIVE

## 2021-01-14 LAB — RESP PANEL BY RT-PCR (FLU A&B, COVID) ARPGX2
Influenza A by PCR: NEGATIVE
Influenza B by PCR: NEGATIVE
SARS Coronavirus 2 by RT PCR: POSITIVE — AB

## 2021-01-14 LAB — LIPASE, BLOOD: Lipase: 27 U/L (ref 11–51)

## 2021-01-14 MED ORDER — DROPERIDOL 2.5 MG/ML IJ SOLN
1.2500 mg | Freq: Once | INTRAMUSCULAR | Status: AC
  Administered 2021-01-14: 1.25 mg via INTRAVENOUS
  Filled 2021-01-14: qty 2

## 2021-01-14 MED ORDER — ONDANSETRON 4 MG PO TBDP: 4.0000 mg | ORAL_TABLET | Freq: Three times a day (TID) | ORAL | 0 refills | Status: DC | PRN

## 2021-01-14 MED ORDER — IBUPROFEN 800 MG PO TABS
800.0000 mg | ORAL_TABLET | Freq: Once | ORAL | Status: AC
  Administered 2021-01-14: 800 mg via ORAL
  Filled 2021-01-14: qty 1

## 2021-01-14 MED ORDER — METRONIDAZOLE 500 MG PO TABS: 500.0000 mg | ORAL_TABLET | Freq: Two times a day (BID) | ORAL | 0 refills | Status: AC

## 2021-01-14 MED ORDER — SODIUM CHLORIDE 0.9 % IV BOLUS
1000.0000 mL | Freq: Once | INTRAVENOUS | Status: AC
  Administered 2021-01-14: 1000 mL via INTRAVENOUS

## 2021-01-14 NOTE — ED Notes (Addendum)
Pt provided crackers and water for PO challenge.   Pt calling for ride at this time.

## 2021-01-14 NOTE — ED Notes (Signed)
EDP at bedside  

## 2021-01-14 NOTE — ED Triage Notes (Signed)
Pt provided with warm blanket per her request, pt yelling at staff that she is going to "die in here". Pt visualized walking around on cell phone yelling at this time and hyperventilating.

## 2021-01-14 NOTE — ED Notes (Signed)
Pt to desk at this time, states she feels light headed at this time, this RN encouraged patient to sit back in her wheelchair if feeling light headed, pt states "so what's the precaution if someone dies in here?"  This RN explained triage protocol. Pt visualized walking around lobby at this time, pt back to sitting in wheelchair at this time, continues to be tearful during interactions with staff.

## 2021-01-14 NOTE — Discharge Instructions (Addendum)
You were seen for positive for trichomonas.  We have started you on a course of Flagyl.  You should have your partner get prophylactically treated as well.  Avoid sexual contact until your treatment is complete and their treatment is complete.  You are also positive for COVID.  Remain quarantined.  Take Tylenol 1 g every 8 hours and ibuprofen 600 every 6 hours with food.  Take the Zofran to help with nausea.  Return to the ER for any other concerns

## 2021-01-14 NOTE — ED Notes (Signed)
Pt visualized walking around lobby on phone, tearful stating that she needs to go to [place] and requesting that someone come pick her up.

## 2021-01-14 NOTE — ED Provider Notes (Signed)
[facility] Emergency Department Provider Note  ____________________________________________   Event Date/Time   First MD Initiated Contact with Patient 01/14/21 [phone]     (approximate)  I have reviewed the triage vital signs and the nursing notes.   HISTORY  Chief Complaint Abdominal Pain    HPI Casey Owens is a 30 y.o. female with ovarian cyst, PTSD who comes in with abdominal pain.  Patient reported generalized lower abdominal pain that radiates into the back associate with nausea and vomiting.  Patient states her symptoms started 2 days ago now that she is been in the waiting room for 9 hours.  Her pain is constant, slightly better with the ibuprofen and the Zofran given in triage 3 makes it worse.  Denies this ever happening previously.  Patient does smoke marijuana daily.  Denies ever having cyclic vomiting from it.  She states that her periods are irregular at baseline and had a last period 2 months ago.  She states that her pain is mostly suprapubic.  Denies any upper chest pain, shortness of breath, fevers.  She denies any vaginal discharge.  Has had 1 partner for multiple years.            Past Medical History:  Diagnosis Date   Anxiety    Asthma    Depression    Bi-polar   Ovarian cyst    PTSD (post-traumatic stress disorder)    Substance abuse affecting pregnancy, antepartum 02/09/2015   [facility]    Patient Active Problem List   Diagnosis Date Noted   Bipolar 1 disorder (HCC) 03/02/2019   PTSD (post-traumatic stress disorder) 03/02/2019   Asthma 03/02/2019   Grand multipara [id number] 03/02/2019   Preterm delivery, delivered at 25 6/7 with +UDS cocaine and MJ 11/05/18 03/02/2019   Cesarean delivery 11/05/18 at 25 6/7 wks breech with BMTZ and MgSo4 03/02/2019   Indication for care in labor and delivery, antepartum 11/05/2018   Abdominal pain during pregnancy, second trimester 09/19/2018   Preterm labor and births  x4 10/10/2017   Indication for care in labor or delivery 09/21/2017   Preterm uterine contractions in second trimester, antepartum 08/08/2017   Polyhydramnios affecting pregnancy 07/30/2017   Cervical insufficiency during pregnancy, antepartum 07/30/2017   Asymptomatic bacteriuria during pregnancy in second trimester 05/27/2017   History of drug use 04/29/2017   Previous preterm delivery, antepartum x4 04/29/2017   Supervision of high-risk pregnancy 04/28/2017   Tobacco use in pregnancy 02/10/2015    Past Surgical History:  Procedure Laterality Date   NO PAST SURGERIES     TUBAL LIGATION      Prior to Admission medications   Medication Sig Start Date End Date Taking? Authorizing Provider  albuterol (PROVENTIL HFA;VENTOLIN HFA) 108 (90 Base) MCG/ACT inhaler Inhale 2 puffs into the lungs every 6 (six) hours as needed for wheezing or shortness of breath. Patient not taking: Reported on 11/05/2018 07/18/18   Willy Eddyobinson, Patrick, MD  Budesonide (PULMICORT FLEXHALER) 90 MCG/ACT inhaler Inhale 1 puff into the lungs 2 (two) times daily. Patient not taking: Reported on 11/05/2018 04/01/18   Joni ReiningSmith, Ronald K, PA-C  famotidine (PEPCID) 20 MG tablet Take 1 tablet (20 mg total) by mouth 2 (two) times daily. Patient not taking: Reported on 11/05/2018 09/19/18   McVey, Prudencio Pairebecca A, CNM  metroNIDAZOLE (FLAGYL) 500 MG tablet Take 1 tablet (500 mg total) by mouth 2 (two) times daily. Patient not taking: Reported on 11/05/2018 09/19/18   McVey, Prudencio Pairebecca A,  CNM  ondansetron (ZOFRAN ODT) 4 MG disintegrating tablet Take 1 tablet (4 mg total) by mouth every 6 (six) hours as needed for nausea. Patient not taking: Reported on 11/05/2018 09/19/18   McVey, Prudencio Pair, CNM  prenatal vitamin w/FE, FA (PRENATAL 1 + 1) 27-1 MG TABS tablet Take 1 tablet by mouth daily at 12 noon.    [provider]    Allergies Shellfish allergy  Family History  Problem Relation Age of Onset   Hypertension Mother    Arthritis  Paternal Aunt    Hypertension Maternal Grandmother    Arthritis Paternal Grandmother     Social History Social History   Tobacco Use   Smoking status: Every Day    Packs/day: 0.25    Years: 10.00    Pack years: 2.50    Types: Cigarettes   Smokeless tobacco: Never  Vaping Use   Vaping Use: Never used  Substance Use Topics   Alcohol use: No   Drug use: Yes    Types: Marijuana    Comment: says quit drug use in January      Review of Systems Constitutional: No fever/chills Eyes: No visual changes. ENT: No sore throat. Cardiovascular: Denies chest pain. Respiratory: Denies shortness of breath. Gastrointestinal: Abdominal pain, nausea, vomiting Genitourinary: Negative for dysuria. Musculoskeletal: Back pain Skin: Negative for rash. Neurological: Negative for headaches, focal weakness or numbness. All other ROS negative ____________________________________________   PHYSICAL EXAM:  VITAL SIGNS: ED Triage Vitals [01/13/21 2339]  Enc Vitals Group     BP 125/77     Pulse Rate 93     Resp 18     Temp 98.9 F (37.2 C)     Temp Source Oral     SpO2 97 %     Weight 125 lb (56.7 kg)     Height 5\' 4"  (1.626 m)     Head Circumference      Peak Flow      Pain Score 9     Pain Loc      Pain Edu?      Excl. in GC?     Constitutional: Alert and oriented. Well appearing and in no acute distress. Eyes: Conjunctivae are normal. EOMI. Head: Atraumatic. Nose: No congestion/rhinnorhea. Mouth/Throat: Mucous membranes are moist.   Neck: No stridor. Trachea Midline. FROM Cardiovascular: Normal rate, regular rhythm. Grossly normal heart sounds.  Good peripheral circulation. Respiratory: Normal respiratory effort.  No retractions. Lungs CTAB. Gastrointestinal: Slight tenderness in the lower abdomen without any rebound or guarding.  No distention. No abdominal bruits.  Musculoskeletal: No lower extremity tenderness nor edema.  No joint effusions. Neurologic:  Normal speech and  language. No gross focal neurologic deficits are appreciated.  Skin:  Skin is warm, dry and intact. No rash noted. Psychiatric: Mood and affect are normal. Speech and behavior are normal. GU: Deferred   ____________________________________________   LABS (all labs ordered are listed, but only abnormal results are displayed)  Labs Reviewed  COMPREHENSIVE METABOLIC PANEL - Abnormal; Notable for the following components:      Result Value   CO2 20 (*)    Glucose, Bld 104 (*)    All other components within normal limits  URINALYSIS, COMPLETE (UACMP) WITH MICROSCOPIC - Abnormal; Notable for the following components:   Specific Gravity, Urine >1.030 (*)    Hgb urine dipstick TRACE (*)    Ketones, ur >160 (*)    Protein, ur 30 (*)    Leukocytes,Ua SMALL (*)  RBC / HPF >50 (*)    Bacteria, UA RARE (*)    All other components within normal limits  LIPASE, BLOOD  CBC  PREGNANCY, URINE   ____________________________________________   RADIOLOGY  Official radiology report(s): CT Renal Stone Study  Result Date: 01/14/2021 CLINICAL DATA:  Flank pain with kidney stone suspected. Pain radiates to the back. EXAM: CT ABDOMEN AND PELVIS WITHOUT CONTRAST TECHNIQUE: Multidetector CT imaging of the abdomen and pelvis was performed following the standard protocol without IV contrast. COMPARISON:  08/18/2016 FINDINGS: Lower chest:  No contributory findings. Hepatobiliary: No focal liver abnormality.No evidence of biliary obstruction or stone. Pancreas: Unremarkable. Spleen: Unremarkable. Adrenals/Urinary Tract: Negative adrenals. No hydronephrosis or stone. Probable interpolar cyst in the left kidney. Unremarkable bladder. Stomach/Bowel:  No obstruction. No appendicitis. Vascular/Lymphatic: No acute vascular abnormality. No mass or adenopathy. Reproductive:No pathologic findings. Other: Small volume low-density pelvic fluid, usually physiologic. Musculoskeletal: No acute abnormalities. IMPRESSION: 1.  No acute finding.  No hydronephrosis or nephrolithiasis. 2. Trace pelvic fluid, usually physiologic. Electronically Signed   By: Marnee Spring M.D.   On: 01/14/2021 05:33    ____________________________________________   PROCEDURES  Procedure(s) performed (including Critical Care):  Procedures   ____________________________________________   INITIAL IMPRESSION / ASSESSMENT AND PLAN / ED COURSE  Casey Owens was evaluated in Emergency Department on 01/14/2021 for the symptoms described in the history of present illness. She was evaluated in the context of the global COVID-19 pandemic, which necessitated consideration that the patient might be at risk for infection with the SARS-CoV-2 virus that causes COVID-19. Institutional protocols and algorithms that pertain to the evaluation of patients at risk for COVID-19 are in a state of rapid change based on information released by regulatory bodies including the CDC and federal and state organizations. These policies and algorithms were followed during the patient's care in the ED.    Patient has been waiting in waiting room for over 9 hours with nausea vomiting abdominal and back pain.  Patient had CT ordered to evaluate for appendicitis, diverticulitis, obstruction or other acute pathology.  Patient has a history of tubal ligation but will get pregnancy test to make sure no evidence of ectopic.   Pregnancy test was negative, labs are reassuring CO2 is slightly low.  White count is normal.  No evidence of anemia.  Her urine does have significant ketones and it and RBCs.  No evidence of UTI though  CT scan was negative.  Patient denies any risk factors for PID but given the lower pelvic pain she is open to pelvic exam.  In case this could be cyclic vomiting we will start some droperidol and some IV fluids due to significant amount ketones in her urine.  9:46 AM   Patient CT scan is without evidence of any ovarian pathology that would put  her at high risk for torsion or other ovarian issues.  There is trace pelvic fluid which is most likely just physiological.  Did do a pelvic exam and she is got no adnexal tenderness and no cervical motion tenderness.  Her discharge is very mild.  Did send off swabs. On reassessment patient is feeling much better after the medication.  Will do p.o. challenge after fluids and anticipate discharge home   Patient COVID test was positive.  Patient has no respiratory symptoms and her oxygen levels 100%.  We discussed Paxlovid treatment but patient has no risk factors for COVID and declines treatment with this.  We discussed symptomatic treatment with Tylenol, ibuprofen,  Zofran and a course of Flagyl for trichomonas and bacterial vaginosis.  Recommended no sex until treatment of both her and her partner.  Reassessment patient is feeling well and feels comfortable going home  I discussed the provisional nature of ED diagnosis, the treatment so far, the ongoing plan of care, follow up appointments and return precautions with the patient and any family or support people present. They expressed understanding and agreed with the plan, discharged home.        ____________________________________________   FINAL CLINICAL IMPRESSION(S) / ED DIAGNOSES   Final diagnoses:  Cyclical vomiting  COVID-19  Bacterial vaginosis  Trichomonal infection      MEDICATIONS GIVEN DURING THIS VISIT:  Medications  ondansetron (ZOFRAN-ODT) disintegrating tablet 4 mg (4 mg Oral Given 01/13/21 2343)  ibuprofen (ADVIL) tablet 800 mg (800 mg Oral Given 01/14/21 0307)  sodium chloride 0.9 % bolus 1,000 mL (1,000 mLs Intravenous New Bag/Given 01/14/21 0856)  droperidol (INAPSINE) 2.5 MG/ML injection 1.25 mg (1.25 mg Intravenous Given 01/14/21 0857)     ED Discharge Orders          Ordered    ondansetron (ZOFRAN ODT) 4 MG disintegrating tablet  Every 8 hours PRN        01/14/21 0948    metroNIDAZOLE (FLAGYL) 500 MG  tablet  2 times daily        01/14/21 1048             Note:  This document was prepared using Dragon voice recognition software and may include unintentional dictation errors.    Concha Se, MD 01/14/21 1050

## 2021-01-18 ENCOUNTER — Other Ambulatory Visit: Payer: Self-pay

## 2021-01-18 ENCOUNTER — Emergency Department
Admission: EM | Admit: 2021-01-18 | Discharge: 2021-01-18 | Disposition: A | Discharge status: Home / Self Care | Payer: Medicaid Other | Attending: Emergency Medicine | Admitting: Emergency Medicine

## 2021-01-18 DIAGNOSIS — F17210 Nicotine dependence, cigarettes, uncomplicated: Secondary | ICD-10-CM | POA: Insufficient documentation

## 2021-01-18 DIAGNOSIS — L0231 Cutaneous abscess of buttock: Secondary | ICD-10-CM | POA: Diagnosis present

## 2021-01-18 DIAGNOSIS — J45909 Unspecified asthma, uncomplicated: Secondary | ICD-10-CM | POA: Insufficient documentation

## 2021-01-18 MED ORDER — LIDOCAINE HCL (PF) 1 % IJ SOLN
5.0000 mL | Freq: Once | INTRAMUSCULAR | Status: AC
  Administered 2021-01-18: 5 mL via INTRADERMAL
  Filled 2021-01-18: qty 5

## 2021-01-18 MED ORDER — HYDROCODONE-ACETAMINOPHEN 5-325 MG PO TABS: 1.0000 | ORAL_TABLET | Freq: Four times a day (QID) | ORAL | 0 refills | Status: DC | PRN

## 2021-01-18 MED ORDER — CEPHALEXIN 500 MG PO CAPS: 500.0000 mg | ORAL_CAPSULE | Freq: Four times a day (QID) | ORAL | 0 refills | Status: AC

## 2021-01-18 MED ORDER — LIDOCAINE-EPINEPHRINE-TETRACAINE (LET) TOPICAL GEL
3.0000 mL | Freq: Once | TOPICAL | Status: AC
  Administered 2021-01-18: 3 mL via TOPICAL
  Filled 2021-01-18: qty 3

## 2021-01-18 NOTE — ED Provider Notes (Signed)
[facility] Emergency Department Provider Note  ____________________________________________   Event Date/Time   First MD Initiated Contact with Patient 01/18/21 1024     (approximate)  I have reviewed the triage vital signs and the nursing notes.   HISTORY  Chief Complaint Abscess    HPI Casey Owens is a 30 y.o. female presents emergency department complaining of a abscess to the buttocks.  She states its been there for at least 1 month.  Got very large turn for the last couple of days.  No fever or chills.  No drainage from the site.  Patient has been taking Tylenol without relief.  Past Medical History:  Diagnosis Date   Anxiety    Asthma    Depression    Bi-polar   Ovarian cyst    PTSD (post-traumatic stress disorder)    Substance abuse affecting pregnancy, antepartum 02/09/2015   Positive for [facility] & Cocaine    Patient Active Problem List   Diagnosis Date Noted   Bipolar 1 disorder (HCC) 03/02/2019   PTSD (post-traumatic stress disorder) 03/02/2019   Asthma 03/02/2019   Grand multipara [id number] 03/02/2019   Preterm delivery, delivered at 25 6/7 with +UDS cocaine and MJ 11/05/18 03/02/2019   Cesarean delivery 11/05/18 at 25 6/7 wks breech with BMTZ and MgSo4 03/02/2019   Indication for care in labor and delivery, antepartum 11/05/2018   Abdominal pain during pregnancy, second trimester 09/19/2018   Preterm labor and births x4 10/10/2017   Indication for care in labor or delivery 09/21/2017   Preterm uterine contractions in second trimester, antepartum 08/08/2017   Polyhydramnios affecting pregnancy 07/30/2017   Cervical insufficiency during pregnancy, antepartum 07/30/2017   Asymptomatic bacteriuria during pregnancy in second trimester 05/27/2017   History of drug use 04/29/2017   Previous preterm delivery, antepartum x4 04/29/2017   Supervision of high-risk pregnancy 04/28/2017   Tobacco use in pregnancy 02/10/2015    Past  Surgical History:  Procedure Laterality Date   NO PAST SURGERIES     TUBAL LIGATION      Prior to Admission medications   Medication Sig Start Date End Date Taking? Authorizing Provider  cephALEXin (KEFLEX) 500 MG capsule Take 1 capsule (500 mg total) by mouth 4 (four) times daily for 10 days. 01/18/21 01/28/21 Yes Kapono Luhn, Roselyn Bering, PA-C  HYDROcodone-acetaminophen (NORCO/VICODIN) 5-325 MG tablet Take 1 tablet by mouth every 6 (six) hours as needed for moderate pain. 01/18/21  Yes Terence Googe, Roselyn Bering, PA-C  albuterol (PROVENTIL HFA;VENTOLIN HFA) 108 (90 Base) MCG/ACT inhaler Inhale 2 puffs into the lungs every 6 (six) hours as needed for wheezing or shortness of breath. Patient not taking: Reported on 11/05/2018 07/18/18   Willy Eddy, MD  Budesonide (PULMICORT FLEXHALER) 90 MCG/ACT inhaler Inhale 1 puff into the lungs 2 (two) times daily. Patient not taking: Reported on 11/05/2018 04/01/18   Joni Reining, PA-C  famotidine (PEPCID) 20 MG tablet Take 1 tablet (20 mg total) by mouth 2 (two) times daily. Patient not taking: Reported on 11/05/2018 09/19/18   McVey, Prudencio Pair, CNM  metroNIDAZOLE (FLAGYL) 500 MG tablet Take 1 tablet (500 mg total) by mouth 2 (two) times daily for 7 days. 01/14/21 01/21/21  Concha Se, MD  ondansetron (ZOFRAN ODT) 4 MG disintegrating tablet Take 1 tablet (4 mg total) by mouth every 8 (eight) hours as needed for nausea or vomiting. 01/14/21   Concha Se, MD  prenatal vitamin w/FE, FA (PRENATAL 1 + 1) 27-1 MG TABS tablet  Take 1 tablet by mouth daily at 12 noon.    [provider]    Allergies Shellfish allergy  Family History  Problem Relation Age of Onset   Hypertension Mother    Arthritis Paternal Aunt    Hypertension Maternal Grandmother    Arthritis Paternal Grandmother     Social History Social History   Tobacco Use   Smoking status: Every Day    Packs/day: 0.25    Years: 10.00    Owens years: 2.50    Types: Cigarettes   Smokeless tobacco:  Never  Vaping Use   Vaping Use: Never used  Substance Use Topics   Alcohol use: No   Drug use: Yes    Types: Marijuana    Comment: says quit drug use in January    Review of Systems  Constitutional: No fever/chills Eyes: No visual changes. ENT: No sore throat. Respiratory: Denies cough Cardiovascular: Denies chest pain Gastrointestinal: Denies abdominal pain Genitourinary: Negative for dysuria. Musculoskeletal: Negative for back pain. Skin: Negative for rash. Psychiatric: no mood changes,     ____________________________________________   PHYSICAL EXAM:  VITAL SIGNS: ED Triage Vitals [01/18/21 0950]  Enc Vitals Group     BP 105/90     Pulse Rate 60     Resp 16     Temp 98.2 F (36.8 C)     Temp Source Oral     SpO2 100 %     Weight 125 lb (56.7 kg)     Height 5\' 4"  (1.626 m)     Head Circumference      Peak Flow      Pain Score 10     Pain Loc      Pain Edu?      Excl. in GC?     Constitutional: Alert and oriented. Well appearing and in no acute distress. Eyes: Conjunctivae are normal.  Head: Atraumatic. Nose: No congestion/rhinnorhea. Mouth/Throat: Mucous membranes are moist.   Neck:  supple no lymphadenopathy noted Cardiovascular: Normal rate, regular rhythm.  Respiratory: Normal respiratory effort.  No retractions,  GU: deferred Musculoskeletal: FROM all extremities, warm and well perfused Neurologic:  Normal speech and language.  Skin:  Skin is warm, dry and intact. No rash noted.  Large abscess noted to the left buttock, area is very fluctuant, does not extend into the rectal area Psychiatric: Mood and affect are normal. Speech and behavior are normal.  ____________________________________________   LABS (all labs ordered are listed, but only abnormal results are displayed)  Labs Reviewed - No data to  display ____________________________________________   ____________________________________________  RADIOLOGY    ____________________________________________   PROCEDURES  Procedure(s) performed:   Marland KitchenIncision and Drainage  Date/Time: 01/18/2021 11:45 AM Performed by: 01/20/2021, PA-C Authorized by: Faythe Ghee, PA-C   Consent:    Consent obtained:  Verbal   Consent given by:  Patient   Risks discussed:  Bleeding, incomplete drainage, pain, infection and damage to other organs Universal protocol:    Procedure explained and questions answered to patient or proxy's satisfaction: yes     Immediately prior to procedure, a time out was called: yes     Patient identity confirmed:  Verbally with patient Location:    Type:  Abscess   Location:  Lower extremity   Lower extremity location:  Buttock   Buttock location:  R buttock Pre-procedure details:    Skin preparation:  Povidone-iodine Anesthesia:    Anesthesia method:  Topical application and local infiltration  Topical anesthetic:  LET   Local anesthetic:  Lidocaine 1% WITH epi Procedure type:    Complexity:  Simple Procedure details:    Incision types:  Stab incision   Incision depth:  Submucosal   Wound management:  Probed and deloculated and irrigated with saline   Drainage:  Bloody and purulent   Drainage amount:  Copious   Wound treatment:  Drain placed   Packing materials:  1/4 in iodoform gauze Post-procedure details:    Procedure completion:  Tolerated well, no immediate complications    ____________________________________________   INITIAL IMPRESSION / ASSESSMENT AND PLAN / ED COURSE  Pertinent labs & imaging results that were available during my care of the patient were reviewed by me and considered in my medical decision making (see chart for details).   The patient is a 30 year old female presents emergency department with abscess to the left thigh.  Physical exam shows patient appears  stable  L ET applied  See procedure note for incision and drainage  Patient was given a prescription for Keflex and Vicodin.  She is to return in 2 days for packing removal.  Return to the ER earlier if worsening.  Signs and symptoms of worsening infection were discussed.  She is discharged stable condition.     Casey Owens was evaluated in Emergency Department on 01/18/2021 for the symptoms described in the history of present illness. She was evaluated in the context of the global COVID-19 pandemic, which necessitated consideration that the patient might be at risk for infection with the SARS-CoV-2 virus that causes COVID-19. Institutional protocols and algorithms that pertain to the evaluation of patients at risk for COVID-19 are in a state of rapid change based on information released by regulatory bodies including the CDC and federal and state organizations. These policies and algorithms were followed during the patient's care in the ED.    As part of my medical decision making, I reviewed the following data within the electronic MEDICAL RECORD NUMBER Nursing notes reviewed and incorporated, , Old chart reviewed, Notes from prior ED visits, and [place] Controlled Substance Database  ____________________________________________   FINAL CLINICAL IMPRESSION(S) / ED DIAGNOSES  Final diagnoses:  Abscess of buttock, right      NEW MEDICATIONS STARTED DURING THIS VISIT:  New Prescriptions   CEPHALEXIN (KEFLEX) 500 MG CAPSULE    Take 1 capsule (500 mg total) by mouth 4 (four) times daily for 10 days.   HYDROCODONE-ACETAMINOPHEN (NORCO/VICODIN) 5-325 MG TABLET    Take 1 tablet by mouth every 6 (six) hours as needed for moderate pain.     Note:  This document was prepared using Dragon voice recognition software and may include unintentional dictation errors.    Faythe Ghee, PA-C 01/18/21 1147    Jene Every, MD 01/18/21 [phone]

## 2021-01-18 NOTE — ED Triage Notes (Signed)
Pt to ER via [facility] with complaints of an abscess present to buttocks. Reports abscess has been present for approx 1 month but over the last week it has become more inflamed and grown in size. Denies drainage. Reports taking tylenol extra strength with no relief from pain.

## 2021-01-18 NOTE — ED Notes (Signed)
Placed maxi pad and silk tape over abscess. She was also given extra maxi pads and one size fits all panties.

## 2021-01-18 NOTE — Discharge Instructions (Addendum)
Return to the ER in 2 days for recheck and packing removal Take the medication as prescribed If worsening remove to the ER earlier

## 2021-01-22 ENCOUNTER — Other Ambulatory Visit: Payer: Self-pay

## 2021-01-22 ENCOUNTER — Emergency Department
Admission: EM | Admit: 2021-01-22 | Discharge: 2021-01-22 | Disposition: A | Discharge status: Home / Self Care | Payer: Medicaid Other | Attending: Student in an Organized Health Care Education/Training Program | Admitting: Student in an Organized Health Care Education/Training Program

## 2021-01-22 DIAGNOSIS — Z5189 Encounter for other specified aftercare: Secondary | ICD-10-CM

## 2021-01-22 DIAGNOSIS — J45909 Unspecified asthma, uncomplicated: Secondary | ICD-10-CM | POA: Diagnosis not present

## 2021-01-22 DIAGNOSIS — Z4800 Encounter for change or removal of nonsurgical wound dressing: Secondary | ICD-10-CM | POA: Insufficient documentation

## 2021-01-22 DIAGNOSIS — F17210 Nicotine dependence, cigarettes, uncomplicated: Secondary | ICD-10-CM | POA: Diagnosis not present

## 2021-01-22 NOTE — ED Provider Notes (Signed)
[facility] Emergency Department Provider Note ____________________________________________  Time seen: 0755  I have reviewed the triage vital signs and the nursing notes.  HISTORY  Chief Complaint  Wound Check   HPI Casey Owens is a 30 y.o. female presents her self to the ED for evaluation of a recent abscess to the right buttocks.  She presents at this time requesting wound check and packing removal.  Patient denies any interim complaints.  She tolerated medications as prescribed out difficulty.  Past Medical History:  Diagnosis Date   Anxiety    Asthma    Depression    Bi-polar   Ovarian cyst    PTSD (post-traumatic stress disorder)    Substance abuse affecting pregnancy, antepartum 02/09/2015   Positive for [facility] & Cocaine    Patient Active Problem List   Diagnosis Date Noted   Bipolar 1 disorder (HCC) 03/02/2019   PTSD (post-traumatic stress disorder) 03/02/2019   Asthma 03/02/2019   Grand multipara [id number] 03/02/2019   Preterm delivery, delivered at 25 6/7 with +UDS cocaine and MJ 11/05/18 03/02/2019   Cesarean delivery 11/05/18 at 25 6/7 wks breech with BMTZ and MgSo4 03/02/2019   Indication for care in labor and delivery, antepartum 11/05/2018   Abdominal pain during pregnancy, second trimester 09/19/2018   Preterm labor and births x4 10/10/2017   Indication for care in labor or delivery 09/21/2017   Preterm uterine contractions in second trimester, antepartum 08/08/2017   Polyhydramnios affecting pregnancy 07/30/2017   Cervical insufficiency during pregnancy, antepartum 07/30/2017   Asymptomatic bacteriuria during pregnancy in second trimester 05/27/2017   History of drug use 04/29/2017   Previous preterm delivery, antepartum x4 04/29/2017   Supervision of high-risk pregnancy 04/28/2017   Tobacco use in pregnancy 02/10/2015    Past Surgical History:  Procedure Laterality Date   NO PAST SURGERIES     TUBAL LIGATION      Prior to  Admission medications   Medication Sig Start Date End Date Taking? Authorizing Provider  cephALEXin (KEFLEX) 500 MG capsule Take 1 capsule (500 mg total) by mouth 4 (four) times daily for 10 days. 01/18/21 01/28/21  Fisher, Roselyn Bering, PA-C  HYDROcodone-acetaminophen (NORCO/VICODIN) 5-325 MG tablet Take 1 tablet by mouth every 6 (six) hours as needed for moderate pain. 01/18/21   Sherrie Mustache Roselyn Bering, PA-C  ondansetron (ZOFRAN ODT) 4 MG disintegrating tablet Take 1 tablet (4 mg total) by mouth every 8 (eight) hours as needed for nausea or vomiting. 01/14/21   Concha Se, MD  prenatal vitamin w/FE, FA (PRENATAL 1 + 1) 27-1 MG TABS tablet Take 1 tablet by mouth daily at 12 noon.    [provider]    Allergies Shellfish allergy  Family History  Problem Relation Age of Onset   Hypertension Mother    Arthritis Paternal Aunt    Hypertension Maternal Grandmother    Arthritis Paternal Grandmother     Social History Social History   Tobacco Use   Smoking status: Every Day    Packs/day: 0.25    Years: 10.00    Pack years: 2.50    Types: Cigarettes   Smokeless tobacco: Never  Vaping Use   Vaping Use: Never used  Substance Use Topics   Alcohol use: No   Drug use: Yes    Types: Marijuana    Comment: says quit drug use in January    Review of Systems  Constitutional: Negative for fever. Cardiovascular: Negative for chest pain. Respiratory: Negative for shortness of  breath. Gastrointestinal: Negative for abdominal pain, vomiting and diarrhea. Genitourinary: Negative for dysuria. Musculoskeletal: Negative for back pain. Skin: Negative for rash.  Right buttock abscess status post I&D Neurological: Negative for headaches, focal weakness or numbness. ____________________________________________  PHYSICAL EXAM:  VITAL SIGNS: ED Triage Vitals  Enc Vitals Group     BP 01/22/21 0738 (!) 149/102     Pulse Rate 01/22/21 0738 (!) 54     Resp 01/22/21 0738 16     Temp 01/22/21 0738  97.9 F (36.6 C)     Temp src --      SpO2 01/22/21 0738 100 %     Weight 01/22/21 0758 125 lb (56.7 kg)     Height 01/22/21 0758 5\' 4"  (1.626 m)     Head Circumference --      Peak Flow --      Pain Score 01/22/21 0732 4     Pain Loc --      Pain Edu? --      Excl. in GC? --     Constitutional: Alert and oriented. Well appearing and in no distress. Head: Normocephalic and atraumatic. Eyes: Conjunctivae are normal. Normal extraocular movements Cardiovascular: Normal rate, regular rhythm. Normal distal pulses. Respiratory: Normal respiratory effort. No wheezes/rales/rhonchi. Musculoskeletal: Nontender with normal range of motion in all extremities.  Neurologic:  Normal gait without ataxia. Normal speech and language. No gross focal neurologic deficits are appreciated. Skin:  Skin is warm, dry and intact. No rash noted.  Right buttocks with a healing linear incision with an underlying area of induration.  No purulent spontaneous drainage is appreciated.  The previous iodoform pack has been expelled spontaneously. Psychiatric: Mood and affect are normal. Patient exhibits appropriate insight and judgment. ____________________________________________    {LABS (pertinent positives/negatives)  ____________________________________________  {EKG  ____________________________________________   RADIOLOGY Official radiology report(s): No results found. ____________________________________________  PROCEDURES   Procedures ____________________________________________   INITIAL IMPRESSION / ASSESSMENT AND PLAN / ED COURSE  As part of my medical decision making, I reviewed the following data within the electronic MEDICAL RECORD NUMBER Notes from prior ED visits   Patient ED evaluation and request for wound check following a local I&D to the right buttocks abscess.  Patient presents today about 5 days status post I&D procedure for wound check.  The wound packing has been spontaneously  expelled, it is no signs of any worsening infection.  Patient has no purulent drainage appreciated, and the underlying induration is somewhat improved.  She will continue the antibiotics as prescribed, and follow with primary provider return to the ED if necessary.  Dolorez BRAILEIGH LANDENBERGER was evaluated in Emergency Department on 01/22/2021 for the symptoms described in the history of present illness. She was evaluated in the context of the global COVID-19 pandemic, which necessitated consideration that the patient might be at risk for infection with the SARS-CoV-2 virus that causes COVID-19. Institutional protocols and algorithms that pertain to the evaluation of patients at risk for COVID-19 are in a state of rapid change based on information released by regulatory bodies including the CDC and federal and state organizations. These policies and algorithms were followed during the patient's care in the ED. ____________________________________________  FINAL CLINICAL IMPRESSION(S) / ED DIAGNOSES  Final diagnoses:  Wound check, abscess      01/24/2021, PA-C 01/22/21 1614    01/24/21, MD 01/23/21 [phone]

## 2021-01-22 NOTE — ED Triage Notes (Signed)
Pt comes with c/o wound check. Pt states she needs to have it rechecked and packed. Pt states she was seen her in ED on 25th for first visit.

## 2021-01-22 NOTE — Discharge Instructions (Addendum)
Continue to take antibiotics as prescribed.  Keep the wound clean, dry, and covered.  Apply warm compresses to promote healing.  Follow-up with your primary provider return to the ED if needed.

## 2021-10-03 ENCOUNTER — Ambulatory Visit: Payer: Medicaid Other

## 2021-12-21 ENCOUNTER — Ambulatory Visit: Payer: Medicaid Other | Admitting: Nurse Practitioner

## 2021-12-21 ENCOUNTER — Encounter: Payer: Self-pay | Admitting: Nurse Practitioner

## 2021-12-21 ENCOUNTER — Encounter (LOCAL_COMMUNITY_HEALTH_CENTER): Payer: Medicaid Other | Admitting: Nurse Practitioner

## 2021-12-21 DIAGNOSIS — A599 Trichomoniasis, unspecified: Secondary | ICD-10-CM

## 2021-12-21 DIAGNOSIS — Z113 Encounter for screening for infections with a predominantly sexual mode of transmission: Secondary | ICD-10-CM | POA: Diagnosis not present

## 2021-12-21 LAB — HM HIV SCREENING LAB: HM HIV Screening: NEGATIVE

## 2021-12-21 LAB — HM HEPATITIS C SCREENING LAB: HM Hepatitis Screen: NEGATIVE

## 2021-12-21 LAB — WET PREP FOR TRICH, YEAST, CLUE
Trichomonas Exam: POSITIVE — AB
Yeast Exam: NEGATIVE

## 2021-12-21 LAB — HEPATITIS B SURFACE ANTIGEN: Hepatitis B Surface Ag: NONREACTIVE

## 2021-12-21 MED ORDER — METRONIDAZOLE 500 MG PO TABS: 500.0000 mg | ORAL_TABLET | Freq: Two times a day (BID) | ORAL | 0 refills | Status: AC

## 2021-12-21 NOTE — Progress Notes (Addendum)
[facility] Department  STI clinic/screening visit [address] [place] [address] [phone]  Subjective:  Casey Owens is a 31 y.o. female being seen today for an STI screening visit. The patient reports they do not have symptoms.  Patient reports that they do not desire a pregnancy in the next year.   They reported they are not interested in discussing contraception today.  Patient had a tubal ligation.    No LMP recorded. (Menstrual status: Irregular Periods).   Patient has the following medical conditions:   Patient Active Problem List   Diagnosis Date Noted   Bipolar 1 disorder (HCC) 03/02/2019   PTSD (post-traumatic stress disorder) 03/02/2019   Asthma 03/02/2019    Chief Complaint  Patient presents with   SEXUALLY TRANSMITTED DISEASE    HPI  Patient reports to clinic today for STD screening.  Patient reports being asymptomatic.   Last HIV test per patient/review of record was 09/19/2018. Patient reports last pap was 04/29/2017.   Screening for MPX risk: Does the patient have an unexplained rash? No Is the patient MSM? No Does the patient endorse multiple sex partners or anonymous sex partners? No Did the patient have close or sexual contact with a person diagnosed with MPX? No Has the patient traveled outside the [place] where MPX is endemic? No Is there a high clinical suspicion for MPX-- evidenced by one of the following No  -Unlikely to be chickenpox  -Lymphadenopathy  -Rash that present in same phase of evolution on any given body part See flowsheet for further details and programmatic requirements.   Immunization history:  Immunization History  Administered Date(s) Administered   Influenza,inj,Quad PF,6+ Mos 06/25/2017   Tdap 08/13/2017, 12/17/2017     The following portions of the patient's history were reviewed and updated as appropriate: allergies, current medications, past medical history, past social history, past  surgical history and problem list.  Objective:  There were no vitals filed for this visit.  Physical Exam Vitals and nursing note reviewed.  Constitutional:      Appearance: Normal appearance.  HENT:     Head: Normocephalic and atraumatic. No abrasion, masses or laceration. Hair is normal.     Right Ear: External ear normal.     Left Ear: External ear normal.     Nose: Nose normal.     Mouth/Throat:     Mouth: Mucous membranes are moist. No oral lesions.     Dentition: No dental caries.     Pharynx: Oropharynx is clear. No oropharyngeal exudate or posterior oropharyngeal erythema.     Tonsils: No tonsillar exudate or tonsillar abscesses.  Eyes:     General: Lids are normal.        Right eye: No discharge.        Left eye: No discharge.     Conjunctiva/sclera: Conjunctivae normal.     Right eye: No exudate.    Left eye: No exudate. Pulmonary:     Effort: Pulmonary effort is normal.  Abdominal:     General: Abdomen is flat.     Palpations: Abdomen is soft. There is no mass.     Tenderness: There is no abdominal tenderness. There is no rebound.  Genitourinary:    General: Normal vulva.     Exam position: Lithotomy position.     Pubic Area: No rash or pubic lice.      Labia:        Right: No rash, tenderness, lesion or injury.  Left: No rash, tenderness, lesion or injury.      Vagina: Normal. No vaginal discharge, erythema, bleeding or lesions.     Cervix: No cervical motion tenderness, discharge, friability, lesion or erythema.     Uterus: Normal. Not enlarged and not tender.      Adnexa: Right adnexa normal and left adnexa normal.     Rectum: Normal.     Comments: Amount Discharge: small  Odor: Yes pH: less than 4.5 Adheres to vaginal wall: No Color: color of discharge matches the Britton Bera swab Musculoskeletal:     Cervical back: Full passive range of motion without pain, normal range of motion and neck supple.  Lymphadenopathy:     Head:     Right side of head:  No preauricular or posterior auricular adenopathy.     Left side of head: No preauricular or posterior auricular adenopathy.     Cervical: No cervical adenopathy.     Right cervical: No superficial, deep or posterior cervical adenopathy.    Left cervical: No superficial, deep or posterior cervical adenopathy.     Upper Body:     Right upper body: No supraclavicular, axillary or epitrochlear adenopathy.     Left upper body: No supraclavicular, axillary or epitrochlear adenopathy.     Lower Body: No right inguinal adenopathy. No left inguinal adenopathy.  Skin:    General: Skin is warm and dry.     Findings: No lesion or rash.  Neurological:     Mental Status: She is alert and oriented to person, place, and time.  Psychiatric:        Attention and Perception: Attention normal.        Mood and Affect: Mood normal.        Speech: Speech normal.        Behavior: Behavior normal. Behavior is cooperative.      Assessment and Plan:  [name] is a 31 y.o. female presenting to the [facility] Department for STI screening  1. Screening examination for venereal disease -31 year old female in clinic today for STD screening.  -Patient accepted all screenings including vaginal CT/GC, wert prep and bloodwork for HIV/RPR.  Patient meets criteria for HepB screening? Yes. Ordered? Yes Patient meets criteria for HepC screening? Yes. Ordered? Yes  Treat wet prep per standing order Discussed time line for State Lab results and that patient will be called with positive results and encouraged patient to call if she had not heard in 2 weeks.  Counseled to return or seek care for continued or worsening symptoms Recommended condom use with all sex  Patient is currently using Sterilization for [facility] Women to prevent pregnancy.    - HIV/HCV [place] [facility] - Syphilis Serology, [place] [facility] - HBV Antigen/Antibody [facility] - Chlamydia/Gonorrhea [place] [facility] - WET PREP FOR TRICH,  YEAST, CLUE   2. Trichimoniasis -Please treat patient for Trich.   - metroNIDAZOLE (FLAGYL) 500 MG tablet; Take 1 tablet (500 mg total) by mouth 2 (two) times daily for 7 days.  Dispense: 14 tablet; Refill: 0   Total time spent: 30 minutes   Return if symptoms worsen or fail to improve.    Glenna Fellows, FNP

## 2021-12-21 NOTE — Progress Notes (Signed)
Pt here for STD screening.  Wet mount results reviewed.  The patient was dispensed Metronidazole 500 mg #14  today. I provided counseling today regarding the medication. We discussed the medication, the side effects and when to call clinic. Patient given the opportunity to ask questions. Questions answered. Berdie Ogren, RN

## 2021-12-22 NOTE — Progress Notes (Signed)
See other visit note. Glenna Fellows, FNP

## 2023-08-05 ENCOUNTER — Ambulatory Visit
Admission: RE | Admit: 2023-08-05 | Discharge: 2023-08-05 | Disposition: A | Discharge status: Home / Self Care | Source: Ambulatory Visit

## 2023-08-05 VITALS — BP 155/101 | HR 59 | Temp 97.9°F | Resp 20 | Ht 64.0 in | Wt 125.0 lb

## 2023-08-05 DIAGNOSIS — N926 Irregular menstruation, unspecified: Secondary | ICD-10-CM | POA: Diagnosis not present

## 2023-08-05 DIAGNOSIS — F411 Generalized anxiety disorder: Secondary | ICD-10-CM | POA: Diagnosis not present

## 2023-08-05 DIAGNOSIS — N921 Excessive and frequent menstruation with irregular cycle: Secondary | ICD-10-CM

## 2023-08-05 LAB — POCT URINALYSIS DIP (MANUAL ENTRY)
Bilirubin, UA: NEGATIVE
Glucose, UA: NEGATIVE mg/dL
Ketones, POC UA: NEGATIVE mg/dL
Nitrite, UA: NEGATIVE
Protein Ur, POC: 100 mg/dL — AB
Spec Grav, UA: >=1.03 — AB (ref 1.010–1.025)
Urobilinogen, UA: 0.2 U/dL
pH, UA: 6 (ref 5.0–8.0)

## 2023-08-05 LAB — POCT URINE PREGNANCY: Preg Test, Ur: NEGATIVE

## 2023-08-05 NOTE — Discharge Instructions (Addendum)
 If you experience any symptoms of severe dizziness weakness prior to follow-up with primary care doctor go to the nearest emergency department.

## 2023-08-05 NOTE — ED Provider Notes (Signed)
 [facility]    CSN: [id number] Arrival date & time: 08/05/23  1044      History   Chief Complaint Chief Complaint  Patient presents with   Abdominal Pain    HPI Casey Owens is a 33 y.o. female.    Abdominal Pain Patient here with a long history of period irregularity.  She reports her period has been on for about a month and she has had intermittent size clots.  She denies a history of anemia related to menstrual cycles.  She is also describing some atypical signs associated with a heavy period including shoulder pain and also symptoms of shortness of breath.  Patient denies any recent follow-up with OB/GYN and is requesting a referral.  Patient also has a history of generalized anxiety Zaidi disorder per chart review bipolar disorder and PTSD.  Patient is requesting resources for behavioral health management. Past Medical History:  Diagnosis Date   Anxiety    Asthma    Depression    Bi-polar   Ovarian cyst    PTSD (post-traumatic stress disorder)    Substance abuse affecting pregnancy, antepartum (HCC) 02/09/2015   Positive for [facility] & Cocaine    Patient Active Problem List   Diagnosis Date Noted   Bipolar 1 disorder (HCC) 03/02/2019   PTSD (post-traumatic stress disorder) 03/02/2019   Asthma 03/02/2019    Past Surgical History:  Procedure Laterality Date   TUBAL LIGATION      OB History     Gravida  6   Para  5   Term  1   Preterm  4   AB      Living  5      SAB      IAB      Ectopic      Multiple  0   Live Births  5            Home Medications    Prior to Admission medications   Medication Sig Start Date End Date Taking? Authorizing Provider  acetaminophen (TYLENOL) 325 MG tablet Take 325 mg by mouth every 6 (six) hours as needed. 11/09/18   [provider]  albuterol (VENTOLIN HFA) 108 (90 Base) MCG/ACT inhaler Inhale 2 puffs into the lungs every 4 (four) hours as needed for wheezing or shortness of  breath. 07/18/18   [provider]  HYDROcodone-acetaminophen (NORCO/VICODIN) 5-325 MG tablet Take 1 tablet by mouth every 6 (six) hours as needed for moderate pain. Patient not taking: Reported on 12/21/2021 01/18/21   Faythe Ghee, PA-C  ibuprofen (ADVIL) 600 MG tablet Take 600 mg by mouth every 6 (six) hours as needed. 11/09/18   [provider]  lidocaine 4 % Place 1 patch onto the skin daily. 11/09/18   [provider]  ondansetron (ZOFRAN ODT) 4 MG disintegrating tablet Take 1 tablet (4 mg total) by mouth every 8 (eight) hours as needed for nausea or vomiting. Patient not taking: Reported on 12/21/2021 01/14/21   Concha Se, MD  prenatal vitamin w/FE, FA (PRENATAL 1 + 1) 27-1 MG TABS tablet Take 1 tablet by mouth daily at 12 noon. Patient not taking: Reported on 12/21/2021    [provider]    Family History Family History  Problem Relation Age of Onset   Arthritis Paternal Grandmother    Diabetes Maternal Grandmother    Hypertension Maternal Grandmother    Diabetes Maternal Grandfather    Clotting disorder Maternal Grandfather    Hypertension  Mother    Arthritis Paternal Aunt     Social History Social History   Tobacco Use   Smoking status: Every Day    Types: Cigarettes   Smokeless tobacco: Never   Tobacco comments:    2 cigarettes a day   Vaping Use   Vaping status: Every Day   Substances: Nicotine, Flavoring  Substance Use Topics   Alcohol use: No   Drug use: Yes    Types: Marijuana    Comment: daily     Allergies   Shellfish allergy   Review of Systems Review of Systems  Gastrointestinal:  Positive for abdominal pain.     Physical Exam Triage Vital Signs ED Triage Vitals  Encounter Vitals Group     BP 08/05/23 1107 (!) 155/101     Systolic BP Percentile --      Diastolic BP Percentile --      Pulse Rate 08/05/23 1107 (!) 59     Resp 08/05/23 1107 20     Temp 08/05/23 1107 97.9 F (36.6 C)     Temp Source  08/05/23 1107 Oral     SpO2 08/05/23 1107 98 %     Weight 08/05/23 1104 125 lb (56.7 kg)     Height 08/05/23 1104 5\' 4"  (1.626 m)     Head Circumference --      Peak Flow --      Pain Score 08/05/23 1102 8     Pain Loc --      Pain Education --      Exclude from Growth Chart --    No data found.  Updated Vital Signs BP (!) 155/101 (BP Location: Left Arm)   Pulse (!) 59   Temp 97.9 F (36.6 C) (Oral)   Resp 20   Ht 5\' 4"  (1.626 m)   Wt 125 lb (56.7 kg)   LMP 07/08/2023 (Exact Date)   SpO2 98%   BMI 21.46 kg/m   Visual Acuity Right Eye Distance:   Left Eye Distance:   Bilateral Distance:    Right Eye Near:   Left Eye Near:    Bilateral Near:     Physical Exam Vitals reviewed.  Constitutional:      Appearance: She is well-developed.  HENT:     Head: Normocephalic and atraumatic.  Eyes:     Extraocular Movements: Extraocular movements intact.     Pupils: Pupils are equal, round, and reactive to light.  Cardiovascular:     Rate and Rhythm: Normal rate and regular rhythm.  Pulmonary:     Effort: Pulmonary effort is normal.     Breath sounds: Normal breath sounds.  Abdominal:     General: Abdomen is flat. Bowel sounds are normal.     Palpations: Abdomen is soft.  Skin:    General: Skin is warm.  Neurological:     General: No focal deficit present.     Mental Status: She is alert and oriented to person, place, and time.  Psychiatric:        Mood and Affect: Mood is anxious.      UC Treatments / Results  Labs (all labs ordered are listed, but only abnormal results are displayed) Labs Reviewed  POCT URINALYSIS DIP (MANUAL ENTRY) - Abnormal; Notable for the following components:      Result Value   Color, UA red (*)    Clarity, UA cloudy (*)    Spec Grav, UA >=1.030 (*)    Blood, UA large (*)  Protein Ur, POC =100 (*)    Leukocytes, UA Trace (*)    All other components within normal limits  POCT URINE PREGNANCY - Normal  HEMOGLOBIN AND HEMATOCRIT,  BLOOD   Narrative:    Performed at:  [address], [place], [place]  [id number] Lab Director: Jolene Schimke MD, Phone:  [phone]    EKG   Radiology No results found.  Procedures Procedures (including critical care time)  Medications Ordered in UC Medications - No data to display  Initial Impression / Assessment and Plan / UC Course  I have reviewed the triage vital signs and the nursing notes.  Pertinent labs & imaging results that were available during my care of the patient were reviewed by me and considered in my medical decision making (see chart for details).    Menstrual period irregularity and menorrhalgia, referral placed for OB/GYN evaluation.  Patient given resources to establish with [facility] behavioral health patient clinic for management of mental health conditions.  Patient also scheduled with a new PCP for chronic disease management.  Hematocrit and hemoglobin pending to assess for anemia related to heavy menstrual cycle.  Patient appears generally well and is in no acute distress.  ED precautions given if any red flag symptoms develop prior to follow-up with gynecology. Final Clinical Impressions(s) / UC Diagnoses   Final diagnoses:  Menstrual periods irregular  Menorrhagia with irregular cycle  GAD (generalized anxiety disorder)     Discharge Instructions      If you experience any symptoms of severe dizziness weakness prior to follow-up with primary care doctor go to the nearest emergency department.     ED Prescriptions   None    PDMP not reviewed this encounter.   Bing Neighbors, NP 08/08/23 1242

## 2023-08-05 NOTE — ED Notes (Signed)
 Note GAD7.

## 2023-08-05 NOTE — ED Triage Notes (Signed)
 My cycles have been very heavy an really big clots bigger that a half a dollar coin. Hard to breath. - Entered by patient  "This has been happening a while now so I am concerned". "When my cycles come on big clots and shoulder pain at times, when breathing I feel like I can't breath in good". My PCP is [facility].

## 2023-08-06 ENCOUNTER — Encounter: Payer: Self-pay | Admitting: Family Medicine

## 2023-08-06 LAB — HEMOGLOBIN AND HEMATOCRIT, BLOOD
Hematocrit: 35.4 % (ref 34.0–46.6)
Hemoglobin: 11.7 g/dL (ref 11.1–15.9)

## 2023-08-22 ENCOUNTER — Ambulatory Visit (HOSPITAL_BASED_OUTPATIENT_CLINIC_OR_DEPARTMENT_OTHER): Admitting: Family Medicine

## 2023-08-26 ENCOUNTER — Ambulatory Visit (HOSPITAL_BASED_OUTPATIENT_CLINIC_OR_DEPARTMENT_OTHER): Admitting: Certified Nurse Midwife

## 2023-08-26 ENCOUNTER — Other Ambulatory Visit (HOSPITAL_COMMUNITY)
Admission: RE | Admit: 2023-08-26 | Discharge: 2023-08-26 | Disposition: A | Discharge status: Home / Self Care | Source: Ambulatory Visit | Attending: Certified Nurse Midwife | Admitting: Certified Nurse Midwife

## 2023-08-26 ENCOUNTER — Encounter (HOSPITAL_BASED_OUTPATIENT_CLINIC_OR_DEPARTMENT_OTHER): Payer: Self-pay | Admitting: Certified Nurse Midwife

## 2023-08-26 VITALS — BP 113/78 | HR 43 | Ht 64.0 in | Wt 133.0 lb

## 2023-08-26 DIAGNOSIS — Z124 Encounter for screening for malignant neoplasm of cervix: Secondary | ICD-10-CM

## 2023-08-26 DIAGNOSIS — Z01419 Encounter for gynecological examination (general) (routine) without abnormal findings: Secondary | ICD-10-CM

## 2023-08-26 DIAGNOSIS — Z113 Encounter for screening for infections with a predominantly sexual mode of transmission: Secondary | ICD-10-CM | POA: Diagnosis not present

## 2023-08-26 DIAGNOSIS — N926 Irregular menstruation, unspecified: Secondary | ICD-10-CM | POA: Diagnosis not present
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
## 2023-08-28 ENCOUNTER — Encounter (HOSPITAL_BASED_OUTPATIENT_CLINIC_OR_DEPARTMENT_OTHER): Payer: Self-pay | Admitting: Certified Nurse Midwife

## 2023-08-28 ENCOUNTER — Ambulatory Visit (INDEPENDENT_AMBULATORY_CARE_PROVIDER_SITE_OTHER): Admitting: Certified Nurse Midwife

## 2023-08-28 VITALS — BP 104/56 | HR 53 | Wt 131.4 lb

## 2023-08-28 DIAGNOSIS — Z758 Other problems related to medical facilities and other health care: Secondary | ICD-10-CM

## 2023-08-28 DIAGNOSIS — Z113 Encounter for screening for infections with a predominantly sexual mode of transmission: Secondary | ICD-10-CM | POA: Diagnosis not present

## 2023-08-28 DIAGNOSIS — Z0000 Encounter for general adult medical examination without abnormal findings: Secondary | ICD-10-CM

## 2023-08-28 DIAGNOSIS — Z30430 Encounter for insertion of intrauterine contraceptive device: Secondary | ICD-10-CM

## 2023-08-28 MED ORDER — LEVONORGESTREL 20 MCG/DAY IU IUD
1.0000 | INTRAUTERINE_SYSTEM | Freq: Once | INTRAUTERINE | Status: AC
  Administered 2023-08-28: 1 via INTRAUTERINE

## 2023-08-28 NOTE — Progress Notes (Signed)
 GYNECOLOGY  VISIT  CC:   Pt desires to try Mirena IUD for prolonged periods, menorrhagia, dysmenorrhea   HPI: 33 y.o. [id number] Single Black or African American female here for Mirena IUD. Pap smear UTD.    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 (Patient not taking: Reported on 08/28/2023)     albuterol (VENTOLIN HFA) 108 (90 Base) MCG/ACT inhaler Inhale 2 puffs into the lungs every 4 (four) hours as needed for wheezing or shortness of breath. (Patient not taking: Reported on 08/28/2023)     HYDROcodone-acetaminophen (NORCO/VICODIN) 5-325 MG tablet Take 1 tablet by mouth every 6 (six) hours as needed for moderate pain. (Patient not taking: Reported on 12/21/2021) 15 tablet 0   ibuprofen (ADVIL) 600 MG tablet Take 600 mg by mouth every 6 (six) hours as needed. (Patient not taking: Reported on 08/28/2023)     lidocaine 4 % Place 1 patch onto the skin daily. (Patient not taking: Reported on 08/28/2023)     ondansetron (ZOFRAN ODT) 4 MG disintegrating tablet Take 1 tablet (4 mg total) by mouth every 8 (eight) hours as needed for nausea or vomiting. (Patient not taking: Reported on 12/21/2021) 20 tablet 0   prenatal vitamin w/FE, FA (PRENATAL 1 + 1) 27-1 MG TABS tablet Take 1 tablet by mouth daily at 12 noon. (Patient not taking: Reported on 12/21/2021)     No current facility-administered medications on file prior to visit.    ALLERGIES: Shellfish allergy   PHYSICAL EXAMINATION:    BP (!) 104/56 (BP Location: Right Arm, Patient Position: Sitting, Cuff Size: Normal)   Pulse (!) 53   Wt 131 lb 6.4 oz (59.6 kg)    LMP 08/03/2023 (Exact Date)   BMI 22.55 kg/m     General appearance: alert, cooperative and appears stated age  Pelvic: External genitalia:  no lesions              Urethra:  normal appearing urethra with no masses, tenderness or lesions              Bartholins and Skenes: normal                 Vagina: normal mucosa without prolapse or lesions              Cervix: multiparous appearance and no lesions              Bimanual Exam:  Uterus:  normal size, contour, position, consistency, mobility, non-tender              Adnexa: no mass, fullness, tenderness              Anus:  normal sphincter tone, no lesions  Chaperone, T. Gerri Spore, CMA, was present for exam.  Assessment/Plan: 1. Encounter Insertion Mirena IUD    IUD Procedure Note Patient identified, informed consent performed.  Discussed risks of irregular bleeding, cramping, infection, malpositioning or misplacement of the IUD outside the uterus which may require further procedures. Time out was performed.    Speculum placed in  the vagina.  Cervix visualized.  Cleaned with Chlorhexidine.   Grasped anteriorly with a single tooth tenaculum.  Uterus sounded to 5 cm.  Mirena IUD placed per manufacturer's recommendations.  Strings trimmed to 3 cm. Tenaculum was removed, good hemostasis noted.  Patient tolerated procedure well.   Patient was given post-procedure instructions and the Mirena care card with expiration date.  Patient was also asked to check IUD strings periodically and follow up in 4-8 weeks for IUD check.   2. Screen for STD (sexually transmitted disease) - Referral placed to establish care with Family Medicine (routine screening labs desired by pt) - HIV antibody (with reflex) - RPR - Hepatitis C Antibody - Hepatitis B Surface AntiGEN  Letta Kocher

## 2023-08-28 NOTE — Addendum Note (Signed)
 Addended by: Ina Homes B on: 08/28/2023 09:40 AM   Modules accepted: Orders

## 2023-08-29 ENCOUNTER — Encounter (HOSPITAL_BASED_OUTPATIENT_CLINIC_OR_DEPARTMENT_OTHER): Payer: Self-pay | Admitting: Certified Nurse Midwife

## 2023-08-29 ENCOUNTER — Other Ambulatory Visit (HOSPITAL_BASED_OUTPATIENT_CLINIC_OR_DEPARTMENT_OTHER): Payer: Self-pay | Admitting: Certified Nurse Midwife

## 2023-08-29 LAB — HEPATITIS B SURFACE ANTIGEN: Hepatitis B Surface Ag: NEGATIVE

## 2023-08-29 LAB — CYTOLOGY - PAP
Chlamydia: NEGATIVE
Comment: NEGATIVE
Comment: NEGATIVE
Comment: NEGATIVE
Comment: NORMAL
Diagnosis: UNDETERMINED — AB
High risk HPV: NEGATIVE
Neisseria Gonorrhea: NEGATIVE
Trichomonas: POSITIVE — AB

## 2023-08-29 LAB — LIPID PANEL
Chol/HDL Ratio: 3 ratio (ref 0.0–4.4)
Cholesterol, Total: 225 mg/dL — ABNORMAL HIGH (ref 100–199)
HDL: 74 mg/dL (ref >39)
LDL Chol Calc (NIH): 126 mg/dL — ABNORMAL HIGH (ref 0–99)
Triglycerides: 143 mg/dL (ref 0–149)
VLDL Cholesterol Cal: 25 mg/dL (ref 5–40)

## 2023-08-29 LAB — CBC
Hematocrit: 39.4 % (ref 34.0–46.6)
Hemoglobin: 12.6 g/dL (ref 11.1–15.9)
MCH: 31.3 pg (ref 26.6–33.0)
MCHC: 32 g/dL (ref 31.5–35.7)
MCV: 98 fL — ABNORMAL HIGH (ref 79–97)
Platelets: 373 10*3/uL (ref 150–450)
RBC: 4.03 x10E6/uL (ref 3.77–5.28)
RDW: 13.2 % (ref 11.7–15.4)
WBC: 9.7 10*3/uL (ref 3.4–10.8)

## 2023-08-29 LAB — HEMOGLOBIN A1C
Est. average glucose Bld gHb Est-mCnc: 108 mg/dL
Hgb A1c MFr Bld: 5.4 % (ref 4.8–5.6)

## 2023-08-29 LAB — RPR: RPR Ser Ql: NONREACTIVE

## 2023-08-29 LAB — TSH: TSH: 1.28 u[IU]/mL (ref 0.450–4.500)

## 2023-08-29 LAB — HIV ANTIBODY (ROUTINE TESTING W REFLEX): HIV Screen 4th Generation wRfx: NONREACTIVE

## 2023-08-29 LAB — HEPATITIS C ANTIBODY: Hep C Virus Ab: NONREACTIVE

## 2023-08-29 MED ORDER — METRONIDAZOLE 500 MG PO TABS: 500.0000 mg | ORAL_TABLET | Freq: Two times a day (BID) | ORAL | 3 refills | Status: DC

## 2023-09-24 ENCOUNTER — Ambulatory Visit (HOSPITAL_BASED_OUTPATIENT_CLINIC_OR_DEPARTMENT_OTHER): Admitting: Family Medicine

## 2023-10-02 ENCOUNTER — Ambulatory Visit (HOSPITAL_BASED_OUTPATIENT_CLINIC_OR_DEPARTMENT_OTHER): Admitting: Certified Nurse Midwife

## 2023-10-02 ENCOUNTER — Encounter (HOSPITAL_BASED_OUTPATIENT_CLINIC_OR_DEPARTMENT_OTHER): Payer: Self-pay

## 2023-10-07 ENCOUNTER — Encounter (HOSPITAL_BASED_OUTPATIENT_CLINIC_OR_DEPARTMENT_OTHER): Payer: Self-pay | Admitting: Certified Nurse Midwife

## 2023-10-07 ENCOUNTER — Ambulatory Visit (INDEPENDENT_AMBULATORY_CARE_PROVIDER_SITE_OTHER): Admitting: Certified Nurse Midwife

## 2023-10-07 VITALS — BP 116/83 | HR 49 | Ht 64.0 in | Wt 131.0 lb

## 2023-10-07 DIAGNOSIS — Z30431 Encounter for routine checking of intrauterine contraceptive device: Secondary | ICD-10-CM | POA: Diagnosis not present

## 2023-10-07 NOTE — Progress Notes (Signed)
   GYNECOLOGY  VISIT  CC:   IUD check  HPI: 33 y.o. [phone] Single Black or African American female here for IUD check. Pt reports irregular spotting and/or bleeding most days since IUD insertion.   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 14 tablet 3   No current facility-administered medications on file prior to visit.    ALLERGIES: Shellfish allergy   Review of Systems  Constitutional: Negative.    PHYSICAL EXAMINATION:    BP 116/83 (BP Location: Right Arm, Patient Position: Sitting, Cuff Size: Normal)   Pulse (!) 49   Ht 5\' 4"  (1.626 m)   Wt 131 lb (59.4 kg)   BMI 22.49 kg/m     General appearance: alert, cooperative and appears stated age   Pelvic: External genitalia:  no lesions              Urethra:  normal appearing urethra with no masses, tenderness or lesions              Bartholins and Skenes: normal                 Vagina: normal mucosa without prolapse or lesions              Cervix: no lesions and IUD strings visible and palpable 3cm              Bimanual Exam:  Uterus:  normal size, contour, position, consistency, mobility, non-tender             1. IUD check up (Primary) - IUD string check completed - Pt will continue to monitor irregular spotting/bleeding and is aware this can be within normal for several months after IUD insertion  RTO 1 year for annual gyn exam and prn if issues arise. Yolanda Hence

## 2023-12-25 ENCOUNTER — Ambulatory Visit (HOSPITAL_BASED_OUTPATIENT_CLINIC_OR_DEPARTMENT_OTHER): Admitting: Certified Nurse Midwife

## 2024-03-01 ENCOUNTER — Encounter (HOSPITAL_COMMUNITY): Payer: Self-pay

## 2024-03-01 ENCOUNTER — Other Ambulatory Visit: Payer: Self-pay

## 2024-03-01 ENCOUNTER — Emergency Department (HOSPITAL_COMMUNITY)
Admission: EM | Admit: 2024-03-01 | Discharge: 2024-03-01 | Disposition: A | Discharge status: Home / Self Care | Payer: Self-pay | Attending: Emergency Medicine | Admitting: Emergency Medicine

## 2024-03-01 DIAGNOSIS — R112 Nausea with vomiting, unspecified: Secondary | ICD-10-CM | POA: Insufficient documentation

## 2024-03-01 DIAGNOSIS — R519 Headache, unspecified: Secondary | ICD-10-CM | POA: Insufficient documentation

## 2024-03-01 DIAGNOSIS — R531 Weakness: Secondary | ICD-10-CM | POA: Insufficient documentation

## 2024-03-01 DIAGNOSIS — R410 Disorientation, unspecified: Secondary | ICD-10-CM | POA: Insufficient documentation

## 2024-03-01 LAB — CBC WITH DIFFERENTIAL/PLATELET
Abs Immature Granulocytes: 0.02 K/uL (ref 0.00–0.07)
Basophils Absolute: 0 K/uL (ref 0.0–0.1)
Basophils Relative: 1 %
Eosinophils Absolute: 0.1 K/uL (ref 0.0–0.5)
Eosinophils Relative: 1 %
HCT: 38 % (ref 36.0–46.0)
Hemoglobin: 12.4 g/dL (ref 12.0–15.0)
Immature Granulocytes: 0 %
Lymphocytes Relative: 28 %
Lymphs Abs: 2.3 K/uL (ref 0.7–4.0)
MCH: 31 pg (ref 26.0–34.0)
MCHC: 32.6 g/dL (ref 30.0–36.0)
MCV: 95 fL (ref 80.0–100.0)
Monocytes Absolute: 1 K/uL (ref 0.1–1.0)
Monocytes Relative: 11 %
Neutro Abs: 5 K/uL (ref 1.7–7.7)
Neutrophils Relative %: 59 %
Platelets: 428 K/uL — ABNORMAL HIGH (ref 150–400)
RBC: 4 MIL/uL (ref 3.87–5.11)
RDW: 13.4 % (ref 11.5–15.5)
WBC: 8.5 K/uL (ref 4.0–10.5)
nRBC: 0 % (ref 0.0–0.2)

## 2024-03-01 LAB — COMPREHENSIVE METABOLIC PANEL WITH GFR
ALT: 32 U/L (ref 0–44)
AST: 28 U/L (ref 15–41)
Albumin: 3.9 g/dL (ref 3.5–5.0)
Alkaline Phosphatase: 101 U/L (ref 38–126)
Anion gap: 12 (ref 5–15)
BUN: 9 mg/dL (ref 6–20)
CO2: 22 mmol/L (ref 22–32)
Calcium: 9.6 mg/dL (ref 8.9–10.3)
Chloride: 103 mmol/L (ref 98–111)
Creatinine, Ser: 0.76 mg/dL (ref 0.44–1.00)
GFR, Estimated: >60 mL/min (ref >60)
Glucose, Bld: 102 mg/dL — ABNORMAL HIGH (ref 70–99)
Potassium: 4.1 mmol/L (ref 3.5–5.1)
Sodium: 137 mmol/L (ref 135–145)
Total Bilirubin: 0.4 mg/dL (ref 0.0–1.2)
Total Protein: 8 g/dL (ref 6.5–8.1)

## 2024-03-01 LAB — HCG, QUANTITATIVE, PREGNANCY: hCG, Beta Chain, Quant, S: <1 m[IU]/mL (ref <5)

## 2024-03-01 MED ORDER — DEXAMETHASONE SODIUM PHOSPHATE 10 MG/ML IJ SOLN
10.0000 mg | Freq: Once | INTRAMUSCULAR | Status: AC
  Administered 2024-03-01: 10 mg via INTRAVENOUS
  Filled 2024-03-01: qty 1

## 2024-03-01 MED ORDER — SODIUM CHLORIDE 0.9 % IV BOLUS
1000.0000 mL | Freq: Once | INTRAVENOUS | Status: AC
  Administered 2024-03-01: 1000 mL via INTRAVENOUS

## 2024-03-01 MED ORDER — PROCHLORPERAZINE EDISYLATE 10 MG/2ML IJ SOLN
10.0000 mg | Freq: Once | INTRAMUSCULAR | Status: AC
  Administered 2024-03-01: 10 mg via INTRAVENOUS
  Filled 2024-03-01: qty 2
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
## 2024-03-01 NOTE — Discharge Instructions (Signed)
Your evaluation today has been largely reassuring.  But, it is important that you monitor your condition carefully, and do not hesitate to return to the ED if you develop new, or concerning changes in your condition. ° °Otherwise, please follow-up with your physician for appropriate ongoing care. ° °
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
## 2024-03-10 ENCOUNTER — Emergency Department (HOSPITAL_COMMUNITY): Payer: Self-pay

## 2024-03-10 ENCOUNTER — Other Ambulatory Visit: Payer: Self-pay

## 2024-03-10 ENCOUNTER — Emergency Department (HOSPITAL_COMMUNITY)
Admission: EM | Admit: 2024-03-10 | Discharge: 2024-03-11 | Disposition: A | Discharge status: Home / Self Care | Payer: Self-pay | Attending: Emergency Medicine | Admitting: Emergency Medicine

## 2024-03-10 ENCOUNTER — Encounter (HOSPITAL_COMMUNITY): Payer: Self-pay | Admitting: *Deleted

## 2024-03-10 DIAGNOSIS — R112 Nausea with vomiting, unspecified: Secondary | ICD-10-CM | POA: Insufficient documentation

## 2024-03-10 DIAGNOSIS — M542 Cervicalgia: Secondary | ICD-10-CM | POA: Insufficient documentation

## 2024-03-10 DIAGNOSIS — R079 Chest pain, unspecified: Secondary | ICD-10-CM | POA: Insufficient documentation

## 2024-03-10 DIAGNOSIS — F17200 Nicotine dependence, unspecified, uncomplicated: Secondary | ICD-10-CM | POA: Insufficient documentation

## 2024-03-10 DIAGNOSIS — R519 Headache, unspecified: Secondary | ICD-10-CM | POA: Insufficient documentation

## 2024-03-10 LAB — CBC
HCT: 38 % (ref 36.0–46.0)
Hemoglobin: 13 g/dL (ref 12.0–15.0)
MCH: 32.1 pg (ref 26.0–34.0)
MCHC: 34.2 g/dL (ref 30.0–36.0)
MCV: 93.8 fL (ref 80.0–100.0)
Platelets: 491 K/uL — ABNORMAL HIGH (ref 150–400)
RBC: 4.05 MIL/uL (ref 3.87–5.11)
RDW: 13.3 % (ref 11.5–15.5)
WBC: 11.6 K/uL — ABNORMAL HIGH (ref 4.0–10.5)
nRBC: 0 % (ref 0.0–0.2)

## 2024-03-10 MED ORDER — DEXAMETHASONE SOD PHOSPHATE PF 10 MG/ML IJ SOLN
10.0000 mg | Freq: Once | INTRAMUSCULAR | Status: AC
  Administered 2024-03-10: 10 mg via INTRAVENOUS

## 2024-03-10 MED ORDER — KETOROLAC TROMETHAMINE 15 MG/ML IJ SOLN
15.0000 mg | Freq: Once | INTRAMUSCULAR | Status: AC
  Administered 2024-03-10: 15 mg via INTRAVENOUS
  Filled 2024-03-10: qty 1

## 2024-03-10 MED ORDER — SODIUM CHLORIDE 0.9 % IV BOLUS
1000.0000 mL | Freq: Once | INTRAVENOUS | Status: AC
  Administered 2024-03-10: 1000 mL via INTRAVENOUS

## 2024-03-10 MED ORDER — PROCHLORPERAZINE EDISYLATE 10 MG/2ML IJ SOLN
10.0000 mg | Freq: Once | INTRAMUSCULAR | Status: AC
  Administered 2024-03-10: 10 mg via INTRAVENOUS
  Filled 2024-03-10: qty 2
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
## 2024-03-10 NOTE — ED Triage Notes (Signed)
 Complaints of severe headache, neck pain and chest pain, states I'm having a concussion

## 2024-03-10 NOTE — Discharge Instructions (Addendum)
 I have provided you with the contact information for [facility], please contact their office to schedule follow-up in their concussion clinic in regard to your recurrent headaches.  Follow-up with your PCP.  Return to the emergency department if your symptoms worsen.
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
## 2024-03-11 ENCOUNTER — Ambulatory Visit (HOSPITAL_BASED_OUTPATIENT_CLINIC_OR_DEPARTMENT_OTHER): Payer: Self-pay | Admitting: Certified Nurse Midwife

## 2024-03-11 LAB — BASIC METABOLIC PANEL WITH GFR
Anion gap: 12 (ref 5–15)
BUN: 9 mg/dL (ref 6–20)
CO2: 21 mmol/L — ABNORMAL LOW (ref 22–32)
Calcium: 9.3 mg/dL (ref 8.9–10.3)
Chloride: 102 mmol/L (ref 98–111)
Creatinine, Ser: 0.86 mg/dL (ref 0.44–1.00)
GFR, Estimated: >60 mL/min (ref >60)
Glucose, Bld: 109 mg/dL — ABNORMAL HIGH (ref 70–99)
Potassium: 4 mmol/L (ref 3.5–5.1)
Sodium: 135 mmol/L (ref 135–145)

## 2024-03-11 LAB — TROPONIN I (HIGH SENSITIVITY): Troponin I (High Sensitivity): 4 ng/L (ref <18)

## 2024-03-11 LAB — HCG, SERUM, QUALITATIVE: Preg, Serum: NEGATIVE
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
## 2024-03-31 ENCOUNTER — Emergency Department (HOSPITAL_COMMUNITY)
Admission: EM | Admit: 2024-03-31 | Discharge: 2024-03-31 | Disposition: A | Discharge status: Home / Self Care | Payer: Self-pay | Attending: Emergency Medicine | Admitting: Emergency Medicine

## 2024-03-31 ENCOUNTER — Emergency Department (HOSPITAL_COMMUNITY): Payer: Self-pay

## 2024-03-31 DIAGNOSIS — R051 Acute cough: Secondary | ICD-10-CM

## 2024-03-31 DIAGNOSIS — R21 Rash and other nonspecific skin eruption: Secondary | ICD-10-CM

## 2024-03-31 DIAGNOSIS — B001 Herpesviral vesicular dermatitis: Secondary | ICD-10-CM | POA: Insufficient documentation

## 2024-03-31 DIAGNOSIS — J3489 Other specified disorders of nose and nasal sinuses: Secondary | ICD-10-CM | POA: Insufficient documentation

## 2024-03-31 DIAGNOSIS — J069 Acute upper respiratory infection, unspecified: Secondary | ICD-10-CM | POA: Insufficient documentation

## 2024-03-31 DIAGNOSIS — J45909 Unspecified asthma, uncomplicated: Secondary | ICD-10-CM | POA: Insufficient documentation

## 2024-03-31 DIAGNOSIS — N939 Abnormal uterine and vaginal bleeding, unspecified: Secondary | ICD-10-CM | POA: Insufficient documentation

## 2024-03-31 DIAGNOSIS — H1032 Unspecified acute conjunctivitis, left eye: Secondary | ICD-10-CM | POA: Insufficient documentation

## 2024-03-31 DIAGNOSIS — B009 Herpesviral infection, unspecified: Secondary | ICD-10-CM

## 2024-03-31 LAB — WET PREP, GENITAL
Clue Cells Wet Prep HPF POC: NONE SEEN
Sperm: NONE SEEN
Trich, Wet Prep: NONE SEEN
WBC, Wet Prep HPF POC: >=10 — AB (ref <10)
Yeast Wet Prep HPF POC: NONE SEEN

## 2024-03-31 LAB — URINALYSIS, W/ REFLEX TO CULTURE (INFECTION SUSPECTED)
Bacteria, UA: NONE SEEN
Bilirubin Urine: NEGATIVE
Glucose, UA: NEGATIVE mg/dL
Ketones, ur: 20 mg/dL — AB
Nitrite: NEGATIVE
Protein, ur: 30 mg/dL — AB
Specific Gravity, Urine: 1.028 (ref 1.005–1.030)
WBC, UA: >50 WBC/hpf (ref 0–5)
pH: 5 (ref 5.0–8.0)

## 2024-03-31 LAB — RESP PANEL BY RT-PCR (RSV, FLU A&B, COVID)  RVPGX2
Influenza A by PCR: NEGATIVE
Influenza B by PCR: NEGATIVE
Resp Syncytial Virus by PCR: NEGATIVE
SARS Coronavirus 2 by RT PCR: NEGATIVE

## 2024-03-31 LAB — PREGNANCY, URINE: Preg Test, Ur: NEGATIVE

## 2024-03-31 MED ORDER — VALACYCLOVIR HCL 1 G PO TABS: 1000.0000 mg | ORAL_TABLET | Freq: Two times a day (BID) | ORAL | 0 refills | Status: AC

## 2024-03-31 MED ORDER — ERYTHROMYCIN 5 MG/GM OP OINT: TOPICAL_OINTMENT | OPHTHALMIC | 0 refills | Status: AC

## 2024-03-31 NOTE — ED Provider Notes (Signed)
 [place] EMERGENCY DEPARTMENT AT [facility] Provider Note   CSN: [id number] Arrival date & time: 03/31/24  9097     Patient presents with: URI   Berdene CAILIN Owens is a 33 y.o. female.   The history is provided by the patient and medical records. No language interpreter was used.  URI Presenting symptoms: congestion and cough   Presenting symptoms: no fatigue and no fever   Severity:  Moderate Progression:  Waxing and waning Chronicity:  New Relieved by:  Nothing Worsened by:  Nothing Ineffective treatments:  None tried Associated symptoms: no headaches, no neck pain and no wheezing        Prior to Admission medications   Medication Sig Start Date End Date Taking? Authorizing Provider  metroNIDAZOLE  (FLAGYL ) 500 MG tablet Take 1 tablet (500 mg total) by mouth 2 (two) times daily. 08/29/23   Tad Arland POUR, CNM    Allergies: Shellfish allergy    Review of Systems  Constitutional:  Negative for chills, fatigue and fever.  HENT:  Positive for congestion.   Eyes:  Positive for discharge and redness. Negative for photophobia.  Respiratory:  Positive for cough. Negative for chest tightness, shortness of breath and wheezing.   Cardiovascular:  Negative for chest pain and palpitations.  Gastrointestinal:  Negative for abdominal pain, constipation, diarrhea, nausea and vomiting.  Genitourinary:  Positive for vaginal bleeding and vaginal pain. Negative for dysuria.  Musculoskeletal:  Negative for back pain, neck pain and neck stiffness.  Skin:  Positive for rash (pelvic).  Neurological:  Negative for light-headedness, numbness and headaches.  Psychiatric/Behavioral:  Negative for agitation.   All other systems reviewed and are negative.   Updated Vital Signs BP 122/74 (BP Location: Right Arm)   Pulse (!) 50   Resp 18   LMP  (LMP Unknown)   SpO2 100%   Physical Exam Vitals and nursing note reviewed. Exam conducted with a chaperone present.  Constitutional:       General: She is not in acute distress.    Appearance: She is well-developed. She is not ill-appearing, toxic-appearing or diaphoretic.  HENT:     Head: Atraumatic.     Nose: Congestion and rhinorrhea present.     Mouth/Throat:     Mouth: Mucous membranes are moist.     Pharynx: No oropharyngeal exudate or posterior oropharyngeal erythema.  Eyes:     General:        Left eye: Discharge present.    Extraocular Movements: Extraocular movements intact.     Conjunctiva/sclera:     Left eye: Left conjunctiva is injected.     Pupils: Pupils are equal, round, and reactive to light.     Comments: Injected conjunctiva to left eye with some drainage.  Normal extraocular moods.  No tenderness around the eye.  No eye pain.  Cardiovascular:     Rate and Rhythm: Normal rate and regular rhythm.     Heart sounds: No murmur heard. Pulmonary:     Effort: Pulmonary effort is normal. No respiratory distress.     Breath sounds: Normal breath sounds. No wheezing, rhonchi or rales.  Chest:     Chest wall: No tenderness.  Abdominal:     General: Abdomen is flat.     Palpations: Abdomen is soft.     Tenderness: There is no abdominal tenderness. There is no guarding or rebound.  Musculoskeletal:        General: Tenderness present. No swelling.     Cervical back: Neck  supple.  Skin:    General: Skin is warm and dry.     Capillary Refill: Capillary refill takes less than 2 seconds.     Findings: Erythema and rash present.  Neurological:     Mental Status: She is alert.     Sensory: No sensory deficit.     Motor: No weakness.  Psychiatric:        Mood and Affect: Mood normal.     (all labs ordered are listed, but only abnormal results are displayed) Labs Reviewed  WET PREP, GENITAL - Abnormal; Notable for the following components:      Result Value   WBC, Wet Prep HPF POC >=10 (*)    All other components within normal limits  URINALYSIS, W/ REFLEX TO CULTURE (INFECTION SUSPECTED) - Abnormal;  Notable for the following components:   APPearance HAZY (*)    Hgb urine dipstick MODERATE (*)    Ketones, ur 20 (*)    Protein, ur 30 (*)    Leukocytes,Ua MODERATE (*)    All other components within normal limits  RESP PANEL BY RT-PCR (RSV, FLU A&B, COVID)  RVPGX2  URINE CULTURE  PREGNANCY, URINE  GC/CHLAMYDIA PROBE AMP ([place]) NOT AT [facility]    EKG: None  Radiology: US  Pelvis Complete Result Date: 03/31/2024 EXAM: PELVIC ULTRASOUND TECHNIQUE: Transabdominal pelvic duplex ultrasound using B-mode/gray scaled imaging with Doppler spectral analysis and color flow was obtained. COMPARISON: US  Pelvis 04/17/2018 CLINICAL HISTORY: Vaginal bleeding and pelvic pain, mirena  earlier this year. FINDINGS: ULTRASOUND FINDINGS: UTERUS: Uterus measures 7.8 x 4.6 x 5.3 cm. Uterus demonstrates normal myometrial echotexture. IUD is in satisfactory position at the fundal aspect of the endometrial cavity. ENDOMETRIAL STRIPE: Endometrial stripe measures 2.3 mm. Endometrial stripe is within normal limits. RIGHT OVARY: Right ovary measures 2.2 x 1.8 x 1.7 cm. Right ovary is within normal limits. There is normal arterial and venous Doppler flow. LEFT OVARY: Left ovary measures 2.1 x 1.5 x 1.5 cm. Left ovary is within normal limits. There is normal arterial and venous Doppler flow. FREE FLUID: No free fluid. IMPRESSION: 1. IUD in satisfactory position at the fundal aspect of the endometrial cavity. Electronically signed by: Lonni Necessary MD 03/31/2024 11:19 AM EST RP Workstation: [id number]     Procedures   Medications Ordered in the ED - No data to display                                  Medical Decision Making Amount and/or Complexity of Data Reviewed Labs: ordered. Radiology: ordered.  Risk Prescription drug management.    Casey Owens is a 33 y.o. female with a past medical history significant for anxiety, depression, PTSD, previous ovarian cyst, asthma, and bipolar disorder who  presents with congestion, cough, left conjunctiva erythema, pelvic discomfort, and vaginal bleeding.  According to patient, she has had these multiple complaints for various period of time.  She reports the last week or 2 she has had redness in her left eye that is bothering her and it was crusty and filled with discharge today.  She is worried about conjunctivitis.  She does report for the last 6 days or so she has had URI symptoms with congestion and cough but has not had significant chest pain or shortness of breath.  She reports there is some drainage but is painless phlegm that she is coughing up.  Does not report significant fevers or chills  otherwise.  She is denying nausea, vomiting, constipation, diarrhea.  Denies any dysuria or hematuria but reports she is having vaginal bleeding that she attributes to his Mirena  that she has had since April and has been having trouble with.  She reports she is having some pelvic discomfort and is worried she could have an infection such as BV or yeast infection.  Otherwise she is denying significant discharge but is having some pelvic pain and what she thinks is a rash.  Otherwise she is denying significant abdominal pain back pain or flank pain.  On exam, lungs clear.  Chest nontender.  Abdomen nontender.  Back and flanks nontender.  Patient has some rhinorrhea and congestion but did not have significant wheezing.  No significant rhonchi or rales.  Patient has left eye erythema but pupils are symmetric and reactive with normal extraocular moods.  No evidence of entrapment.  No swelling around the eye or periorbital tenderness.  There was some discharge as well.  Appears consistent with conjunctivitis.  Ears did not show otitis media or otitis externa.  Due to her vaginal bleeding concern for vaginal rash and pelvic pain, we will do a pelvic exam with a chaperone shortly.  Will get a viral swab given the ongoing pandemic to look for COVID/flu/RSV however with her  clear breath sounds and lack of hypoxia will hold on chest x-ray and patient agrees.  We will get a urinalysis given the pelvic discomfort and we will also get a pregnancy test.  Will do a pelvic exam and get swabs to evaluate.  Anticipate reassessment after workup is completed.  10:06 AM Pelvic exam was performed and patient did have bleeding coming from the cervical os.  She did not have significant tenderness on uterine palpation cervical palpation or adnexal tenderness however she was having the bleeding and concerned about possible problem with the Mirena .  Will order a pelvic ultrasound.  Otherwise, patient did have rash that appeared to look like herpes and given the acute timeline suspect this is an initial outbreak.  She reports a weeks ago she had a brief rash that went away and then it came back again.  Will get the pelvic ultrasound but if this is reassuring, anticipate discharge with prescription for Romycin for the eye and valacyclovir for the suspected herpes infection.  Workup returned.  Ultrasound showed IUD is in appropriate place with no other concerning findings on ultrasound.  She can follow-up with her OB/GYN for the vaginal bleeding intermittently.  Her wet prep did not show BV or yeast and she was negative for COVID/flu/RSV.  GC/committee testing was sent and she will be called if something is positive and she was negative for nitrites or bacteria so do not think UTI.  Patient agrees with plan for discharge with prescription for Romycin for her eye and valacyclovir for the suspected herpes infection.  Patient will follow-up with her OB/GYN for the bleeding and she agrees with this plan.  Patient discharged in good condition for outpatient follow-up.     Final diagnoses:  Upper respiratory tract infection, unspecified type  Groin rash  Herpetic lesions  Acute conjunctivitis of left eye, unspecified acute conjunctivitis type  Acute cough  Vaginal bleeding    ED  Discharge Orders          Ordered    erythromycin ophthalmic ointment        03/31/24 1238    valACYclovir (VALTREX) 1000 MG tablet  2 times daily  03/31/24 1238           Clinical Impression: 1. Upper respiratory tract infection, unspecified type   2. Groin rash   3. Herpetic lesions   4. Acute conjunctivitis of left eye, unspecified acute conjunctivitis type   5. Acute cough   6. Vaginal bleeding     Disposition: Discharge  Condition: Good  I have discussed the results, Dx and Tx plan with the pt(& family if present). He/she/they expressed understanding and agree(s) with the plan. Discharge instructions discussed at great length. Strict return precautions discussed and pt &/or family have verbalized understanding of the instructions. No further questions at time of discharge.    Discharge Medication List as of 03/31/2024 12:40 PM     START taking these medications   Details  erythromycin ophthalmic ointment Place a 1/2 inch ribbon of ointment into the lower eyelid every 4hours as needed., Print    valACYclovir (VALTREX) 1000 MG tablet Take 1 tablet (1,000 mg total) by mouth 2 (two) times daily for 14 days., Starting Wed 03/31/2024, Until Wed 04/14/2024, Print        Follow Up: Center, [facility] [address] [place] [address] [phone]     your OBGYN         Anique Beckley, Lonni PARAS, MD 03/31/24 1311

## 2024-03-31 NOTE — ED Triage Notes (Signed)
 Patient complains of uri symptoms and left eye redness. ALso reports bleeding on and off since April since getting a mirena .

## 2024-03-31 NOTE — Discharge Instructions (Signed)
 Your history, exam, workup today led us  to workup several problems today.  Your cough and upper respiratory symptoms are not related to COVID, flu, RSV as you were negative.  Your lungs were clear and we did not hear evidence of pneumonia.  We agreed to hold on chest x-ray today.  Your eye appears to show conjunctivitis and please take the antibiotic ointment and put it in your left eye every 4 hours for the next few days.  Anticipate it will likely clear out shortly.  The vaginal bleeding led us  to do a pelvic exam and your swabs are negative for yeast infection or bacterial vaginosis and the other swabs are still in process.  We did the ultrasound that showed the IUD is still in place but due to the intermittent bleeding we do recommend follow-up with your OB/GYN.  Please rest and stay hydrated.  If any symptoms change or worsen acutely, please return to the nearest emergency department.

## 2024-03-31 NOTE — ED Notes (Signed)
 Pt in bed, pt c/o some redness and drainage to her L eye, pt reports vision is normal.  Sclarea is red. Pt c/o cough, pt has dry cough, lungs clear bilaterally, pt talking in full sentences, pt also c/o vaginal itching and some bleeding. MD at bedside

## 2024-04-01 LAB — GC/CHLAMYDIA PROBE AMP (~~LOC~~) NOT AT ARMC
Chlamydia: NEGATIVE
Comment: NEGATIVE
Comment: NORMAL
Neisseria Gonorrhea: NEGATIVE

## 2024-04-01 LAB — URINE CULTURE: Culture: <10000 — AB

## 2024-04-13 ENCOUNTER — Ambulatory Visit (HOSPITAL_BASED_OUTPATIENT_CLINIC_OR_DEPARTMENT_OTHER): Payer: Self-pay | Admitting: Certified Nurse Midwife

## 2024-05-07 ENCOUNTER — Other Ambulatory Visit: Payer: Self-pay

## 2024-05-07 ENCOUNTER — Emergency Department (HOSPITAL_COMMUNITY)
Admission: EM | Admit: 2024-05-07 | Discharge: 2024-05-07 | Discharge status: Against Medical Advice | Attending: Emergency Medicine | Admitting: Emergency Medicine

## 2024-05-07 ENCOUNTER — Encounter (HOSPITAL_COMMUNITY): Payer: Self-pay

## 2024-05-07 DIAGNOSIS — M25531 Pain in right wrist: Secondary | ICD-10-CM | POA: Insufficient documentation

## 2024-05-07 DIAGNOSIS — R21 Rash and other nonspecific skin eruption: Secondary | ICD-10-CM | POA: Insufficient documentation

## 2024-05-07 DIAGNOSIS — M25532 Pain in left wrist: Secondary | ICD-10-CM | POA: Insufficient documentation

## 2024-05-07 DIAGNOSIS — Z5321 Procedure and treatment not carried out due to patient leaving prior to being seen by health care provider: Secondary | ICD-10-CM | POA: Insufficient documentation

## 2024-05-07 LAB — HCG, SERUM, QUALITATIVE: Preg, Serum: NEGATIVE

## 2024-05-07 MED ORDER — OXYCODONE-ACETAMINOPHEN 5-325 MG PO TABS
1.0000 | ORAL_TABLET | ORAL | Status: DC | PRN
  Administered 2024-05-07: 1 via ORAL
  Filled 2024-05-07: qty 1

## 2024-05-07 NOTE — ED Triage Notes (Addendum)
 Pt c.o 2 months of vaginal rash, diagnosed with HPV. No improvement with valacyclovir   Pt also c.o eye redness, pt using erythromycin  ointment.  Pt also c.o bilateral wrist pain and swelling x 2 months.  Pt also wanting a pregnancy test as she hasn't had a period in months.

## 2024-05-07 NOTE — ED Notes (Signed)
Pt decided to leave AMA due to wait time.

## 2024-06-03 ENCOUNTER — Other Ambulatory Visit: Payer: Self-pay

## 2024-06-03 ENCOUNTER — Emergency Department (HOSPITAL_COMMUNITY)
Admission: EM | Admit: 2024-06-03 | Discharge: 2024-06-03 | Disposition: A | Discharge status: Home / Self Care | Payer: Self-pay | Attending: Emergency Medicine | Admitting: Emergency Medicine

## 2024-06-03 ENCOUNTER — Encounter (HOSPITAL_COMMUNITY): Payer: Self-pay

## 2024-06-03 DIAGNOSIS — H6121 Impacted cerumen, right ear: Secondary | ICD-10-CM | POA: Insufficient documentation

## 2024-06-03 DIAGNOSIS — N898 Other specified noninflammatory disorders of vagina: Secondary | ICD-10-CM | POA: Diagnosis present

## 2024-06-03 DIAGNOSIS — B009 Herpesviral infection, unspecified: Secondary | ICD-10-CM | POA: Insufficient documentation

## 2024-06-03 DIAGNOSIS — D649 Anemia, unspecified: Secondary | ICD-10-CM | POA: Diagnosis not present

## 2024-06-03 DIAGNOSIS — J45909 Unspecified asthma, uncomplicated: Secondary | ICD-10-CM | POA: Insufficient documentation

## 2024-06-03 DIAGNOSIS — R748 Abnormal levels of other serum enzymes: Secondary | ICD-10-CM | POA: Diagnosis not present

## 2024-06-03 LAB — CBC WITH DIFFERENTIAL/PLATELET
Abs Immature Granulocytes: 0.03 K/uL (ref 0.00–0.07)
Basophils Absolute: 0 K/uL (ref 0.0–0.1)
Basophils Relative: 0 %
Eosinophils Absolute: 0.1 K/uL (ref 0.0–0.5)
Eosinophils Relative: 1 %
HCT: 34.8 % — ABNORMAL LOW (ref 36.0–46.0)
Hemoglobin: 11.7 g/dL — ABNORMAL LOW (ref 12.0–15.0)
Immature Granulocytes: 0 %
Lymphocytes Relative: 18 %
Lymphs Abs: 1.8 K/uL (ref 0.7–4.0)
MCH: 32.2 pg (ref 26.0–34.0)
MCHC: 33.6 g/dL (ref 30.0–36.0)
MCV: 95.9 fL (ref 80.0–100.0)
Monocytes Absolute: 0.8 K/uL (ref 0.1–1.0)
Monocytes Relative: 8 %
Neutro Abs: 7.3 K/uL (ref 1.7–7.7)
Neutrophils Relative %: 73 %
Platelets: 369 K/uL (ref 150–400)
RBC: 3.63 MIL/uL — ABNORMAL LOW (ref 3.87–5.11)
RDW: 15.3 % (ref 11.5–15.5)
WBC: 10.1 K/uL (ref 4.0–10.5)
nRBC: 0 % (ref 0.0–0.2)

## 2024-06-03 LAB — COMPREHENSIVE METABOLIC PANEL WITH GFR
ALT: 23 U/L (ref 0–44)
AST: 17 U/L (ref 15–41)
Albumin: 4.1 g/dL (ref 3.5–5.0)
Alkaline Phosphatase: 133 U/L — ABNORMAL HIGH (ref 38–126)
Anion gap: 10 (ref 5–15)
BUN: 8 mg/dL (ref 6–20)
CO2: 23 mmol/L (ref 22–32)
Calcium: 9.5 mg/dL (ref 8.9–10.3)
Chloride: 106 mmol/L (ref 98–111)
Creatinine, Ser: 0.68 mg/dL (ref 0.44–1.00)
GFR, Estimated: >60 mL/min
Glucose, Bld: 87 mg/dL (ref 70–99)
Potassium: 4.5 mmol/L (ref 3.5–5.1)
Sodium: 139 mmol/L (ref 135–145)
Total Bilirubin: 0.3 mg/dL (ref 0.0–1.2)
Total Protein: 7.4 g/dL (ref 6.5–8.1)

## 2024-06-03 LAB — WET PREP, GENITAL
Sperm: NONE SEEN
Trich, Wet Prep: NONE SEEN
WBC, Wet Prep HPF POC: >=10 — AB
Yeast Wet Prep HPF POC: NONE SEEN

## 2024-06-03 MED ORDER — METRONIDAZOLE 500 MG PO TABS: 500.0000 mg | ORAL_TABLET | Freq: Two times a day (BID) | ORAL | 0 refills | Status: AC

## 2024-06-03 MED ORDER — DOCUSATE SODIUM 50 MG/5ML PO LIQD
50.0000 mg | Freq: Once | ORAL | Status: AC
  Administered 2024-06-03: 50 mg via ORAL
  Filled 2024-06-03: qty 10

## 2024-06-03 MED ORDER — ACYCLOVIR 400 MG PO TABS: 400.0000 mg | ORAL_TABLET | Freq: Four times a day (QID) | ORAL | 2 refills | Status: AC

## 2024-06-03 NOTE — ED Provider Notes (Signed)
 " [place] EMERGENCY DEPARTMENT AT [facility] Provider Note   CSN: [id number] Arrival date & time: 06/03/24  1043     Patient presents with: Vaginal Pain   Casey Owens is a 34 y.o. female past medical history significant for asthma, anxiety, who presents concern for multiple complaints today.  She endorses some right ear fullness, earwax impaction.  Has been going on for several months.  She also endorses intermittent vaginal, rectal pain for the last few months.  Recently diagnosed with herpes infection.  She took a complete course of Valtrex  but has not been able to refill this medication.  She does endorse that she is also having some vaginal discharge.  She denies any dysuria, fever, chills.  She reports that she is under a lot of stress.  She reports that she had trouble picking up the Valtrex  due to price.    Vaginal Pain       Prior to Admission medications  Medication Sig Start Date End Date Taking? Authorizing Provider  acyclovir  (ZOVIRAX ) 400 MG tablet Take 1 tablet (400 mg total) by mouth 4 (four) times daily. 06/03/24  Yes Jjesus Dingley H, PA-C  metroNIDAZOLE  (FLAGYL ) 500 MG tablet Take 1 tablet (500 mg total) by mouth 2 (two) times daily. 06/03/24  Yes Charlette Hennings H, PA-C  erythromycin  ophthalmic ointment Place a 1/2 inch ribbon of ointment into the lower eyelid every 4hours as needed. 03/31/24   Tegeler, Lonni PARAS, MD    Allergies: Shellfish allergy    Review of Systems  Genitourinary:  Positive for vaginal pain.  All other systems reviewed and are negative.   Updated Vital Signs BP 134/88 (BP Location: Right Arm)   Pulse 63   Temp 98.3 F (36.8 C) (Oral)   Resp 14   Ht 5' 4 (1.626 m)   Wt 45.4 kg   LMP  (LMP Unknown) Comment: havent had one in months  SpO2 100%   BMI 17.16 kg/m   Physical Exam Vitals and nursing note reviewed.  Constitutional:      General: She is not in acute distress.    Appearance: Normal appearance.   HENT:     Head: Normocephalic and atraumatic.     Right Ear: There is impacted cerumen.  Eyes:     General:        Right eye: No discharge.        Left eye: No discharge.  Cardiovascular:     Rate and Rhythm: Normal rate and regular rhythm.     Heart sounds: No murmur heard.    No friction rub. No gallop.  Pulmonary:     Effort: Pulmonary effort is normal.     Breath sounds: Normal breath sounds.  Abdominal:     General: Bowel sounds are normal.     Palpations: Abdomen is soft.  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 Pelvic exam overall reassuring, thin whitish discharge appears physiologic, no cervical motion tenderness Skin:    General: Skin is warm and dry.     Capillary Refill: Capillary refill takes less than 2 seconds.  Neurological:     Mental Status: She is alert and oriented to person, place, and time.  Psychiatric:        Mood and Affect: Mood normal.        Behavior: Behavior normal.     (all labs ordered are listed, but only abnormal results are displayed) Labs Reviewed  WET PREP, GENITAL -  Abnormal; Notable for the following components:      Result Value   Clue Cells Wet Prep HPF POC PRESENT (*)    WBC, Wet Prep HPF POC >=10 (*)    All other components within normal limits  COMPREHENSIVE METABOLIC PANEL WITH GFR - Abnormal; Notable for the following components:   Alkaline Phosphatase 133 (*)    All other components within normal limits  CBC WITH DIFFERENTIAL/PLATELET - Abnormal; Notable for the following components:   RBC 3.63 (*)    Hemoglobin 11.7 (*)    HCT 34.8 (*)    All other components within normal limits  GC/CHLAMYDIA PROBE AMP ([place]) NOT AT [facility]    EKG: None  Radiology: No results found.   Procedures   Medications Ordered in the ED  docusate (COLACE) 50 MG/5ML liquid 50 mg (50 mg Oral Given 06/03/24 1643)                                    Medical Decision Making Amount and/or Complexity of  Data Reviewed Labs: ordered.   This patient is a 34 y.o. female  who presents to the ED for concern of vaginal lesions, vaginal discharge, ear fullness, cerumen impaction.   Differential diagnoses prior to evaluation: The emergent differential diagnosis includes, but is not limited to, active HSV outbreak, genital warts, chlamydia, gonorrhea, other PID, BV, trichomonas, patient denies any urinary symptoms and so we will defer UA at this time.  She denies any abdominal pain, low clinical concern for TOA, acute intra-abdominal infection.  For her ear fullness considered cerumen impaction, otitis media, otitis externa, versus other. This is not an exhaustive differential.   Past Medical History / Co-morbidities / Social History: Asthma, anxiety, depression  Additional history: Chart reviewed. Pertinent results include: reviewed labwork, imaging from previous ED visits  Physical Exam: Physical exam performed. The pertinent findings include: Vital signs stable other than initially quite hypertensive, blood pressure 181/118.  Likely secondary to stress, improved on reassessment, 134/80  Lab Tests/Imaging studies: I personally interpreted labs/imaging and the pertinent results include:  CMP overall unremarkable other than mildly elevated alkaline phosphatase, 133 unclear clinical significance. CBC overall other than mild anemia, hemoglobin 11.7   Wet prep with some white blood cells, clue cells.  Given that she is having some irritation we will go ahead and treat for BV with metronidazole .   Medications: I ordered medication including docusate, ear lavage for cerumen impaction. Discharged with acyclovir  for active HSV infection.  I have reviewed the patients home medicines and have made adjustments as needed.   Disposition: After consideration of the diagnostic results and the patients response to treatment, I feel that patient stable for discharge with plan as above.   emergency department  workup does not suggest an emergent condition requiring admission or immediate intervention beyond what has been performed at this time. The plan is: as above. The patient is safe for discharge and has been instructed to return immediately for worsening symptoms, change in symptoms or any other concerns.   Final diagnoses:  HSV infection  Impacted cerumen of right ear    ED Discharge Orders          Ordered    acyclovir  (ZOVIRAX ) 400 MG tablet  4 times daily        06/03/24 1654    metroNIDAZOLE  (FLAGYL ) 500 MG tablet  2 times daily  06/03/24 1654               Samaira Holzworth, [place] H, PA-C 06/03/24 1703    Dreama Longs, MD 06/04/24 1249  "

## 2024-06-03 NOTE — ED Triage Notes (Signed)
 Pt arrives via POV. Pt c/o vaginal pain for the past 2 months. Denies discharge or other symptoms. Pt AxOx4.

## 2024-06-03 NOTE — ED Triage Notes (Signed)
 Pt has HSV and c/o right ear hearing loss, bumps on bottom, floaters in bilateral eyes. Pt very tearful in triage. Axox4

## 2024-06-03 NOTE — Discharge Instructions (Signed)
 I prescribed acyclovir  which unlike the other antiviral medication that you are taking is cheaper but has to be taken more often, 4 times daily.  I gave you 2 refills of this medication.  You can follow-up with your primary care doctor in the future if you need further refills of this medication.  Please return if you have significant worsening symptoms despite treatment.

## 2024-06-03 NOTE — ED Provider Triage Note (Signed)
 Emergency Medicine Provider Triage Evaluation Note  Casey Owens , a 34 y.o. female  was evaluated in triage.  Pt complains of multiple complaints. Reports hearing loss R ear, chills, lymphadenopathy, weakness, HSV sores to bottom  Review of Systems  Positive: As above Negative: Fever, chest pain, abdominal pain, n/v/d  Physical Exam  BP (!) 181/118   Pulse 79   Temp 97.9 F (36.6 C) (Oral)   Resp 17   Ht 5' 4 (1.626 m)   Wt 45.4 kg   LMP  (LMP Unknown) Comment: havent had one in months  SpO2 97%   BMI 17.16 kg/m  Gen:   Awake, no distress   Resp:  Normal effort  MSK:   Moves extremities without difficulty  Other:  GU deferred  Medical Decision Making  Medically screening exam initiated at 11:58 AM.  Appropriate orders placed.  Deneane NASRIN LANZO was informed that the remainder of the evaluation will be completed by another provider, this initial triage assessment does not replace that evaluation, and the importance of remaining in the ED until their evaluation is complete.     Francesca Elsie CROME, MD 06/03/24 1159

## 2024-06-04 LAB — GC/CHLAMYDIA PROBE AMP (~~LOC~~) NOT AT ARMC
Chlamydia: NEGATIVE
Comment: NEGATIVE
Comment: NORMAL
Neisseria Gonorrhea: NEGATIVE
